# Patient Record
Sex: Male | Born: 1960 | Hispanic: Yes | Marital: Married | Smoking: Former smoker
Health system: Southern US, Community
[De-identification: ages and names within clinical notes are randomized; demographics above are authoritative.]

## PROBLEM LIST (undated history)

## (undated) ENCOUNTER — Inpatient Hospital Stay: Admission: EM | Payer: Self-pay | Source: Home / Self Care

## (undated) DIAGNOSIS — D509 Iron deficiency anemia, unspecified: Secondary | ICD-10-CM

## (undated) DIAGNOSIS — K635 Polyp of colon: Secondary | ICD-10-CM

## (undated) DIAGNOSIS — T7840XA Allergy, unspecified, initial encounter: Secondary | ICD-10-CM

## (undated) DIAGNOSIS — Z800 Family history of malignant neoplasm of digestive organs: Secondary | ICD-10-CM

## (undated) DIAGNOSIS — C169 Malignant neoplasm of stomach, unspecified: Principal | ICD-10-CM

## (undated) HISTORY — DX: Family history of malignant neoplasm of digestive organs: Z80.0

## (undated) HISTORY — PX: OTHER SURGICAL HISTORY: SHX169

## (undated) HISTORY — PX: COLONOSCOPY: SHX174

## (undated) HISTORY — DX: Polyp of colon: K63.5

## (undated) HISTORY — DX: Iron deficiency anemia, unspecified: D50.9

## (undated) HISTORY — PX: ESOPHAGOGASTRODUODENOSCOPY ENDOSCOPY: SHX5814

## (undated) HISTORY — DX: Allergy, unspecified, initial encounter: T78.40XA

## (undated) HISTORY — DX: Malignant neoplasm of stomach, unspecified: C16.9

---

## 2012-06-14 ENCOUNTER — Encounter: Payer: Self-pay | Admitting: Internal Medicine

## 2012-06-19 ENCOUNTER — Ambulatory Visit (AMBULATORY_SURGERY_CENTER): Payer: Self-pay | Admitting: *Deleted

## 2012-06-19 VITALS — Ht 62.0 in | Wt 143.5 lb

## 2012-06-19 DIAGNOSIS — Z1211 Encounter for screening for malignant neoplasm of colon: Secondary | ICD-10-CM

## 2012-06-19 MED ORDER — MOVIPREP 100 G PO SOLR: ORAL | Status: DC

## 2012-06-22 ENCOUNTER — Encounter: Payer: Self-pay | Admitting: Internal Medicine

## 2012-06-22 ENCOUNTER — Ambulatory Visit (AMBULATORY_SURGERY_CENTER): Payer: Self-pay | Admitting: Internal Medicine

## 2012-06-22 VITALS — BP 119/60 | HR 68 | Temp 97.6°F | Resp 14 | Ht 62.0 in | Wt 143.0 lb

## 2012-06-22 DIAGNOSIS — Z1211 Encounter for screening for malignant neoplasm of colon: Secondary | ICD-10-CM

## 2012-06-22 DIAGNOSIS — D126 Benign neoplasm of colon, unspecified: Secondary | ICD-10-CM

## 2012-06-22 MED ORDER — SODIUM CHLORIDE 0.9 % IV SOLN: 500.0000 mL | INTRAVENOUS | Status: DC

## 2012-06-22 NOTE — Op Note (Signed)
[facility] [address] [place], [address]   COLONOSCOPY PROCEDURE REPORT  PATIENT: Joseph Salinas, Joseph Salinas  MR#: [id number] BIRTHDATE: 1960-07-31 , 51  yrs. old GENDER: Male ENDOSCOPIST: Beverley Fiedler, MD PROCEDURE DATE:  06/22/2012 PROCEDURE:   Colonoscopy with snare polypectomy and Colonoscopy with cold biopsy polypectomy ASA CLASS:   Class I INDICATIONS:elevated risk screening, Patient's immediate family history of colon cancer, and first colonoscopy. MEDICATIONS: MAC sedation, administered by CRNA and propofol (Diprivan) 500mg  IV  DESCRIPTION OF PROCEDURE:   After the risks benefits and alternatives of the procedure were thoroughly explained, informed consent was obtained.  A digital rectal exam revealed no rectal mass.   The LB CF-[id number]  endoscope was introduced through the anus and advanced to the cecum, which was identified by both the appendix and ileocecal valve. No adverse events experienced. The quality of the prep was good, using MoviPrep  The instrument was then slowly withdrawn as the colon was fully examined.   COLON FINDINGS: A sessile polyp measuring 5 mm in size was found at the cecum.  A polypectomy was performed with a cold snare.  The resection was complete and the polyp tissue was completely retrieved.   Four sessile and semi-pedunculated polyps ranging between 3-22mm in size were found in the ascending colon. Polypectomy was performed with cold forceps (1) and using cold snare (3).  All resections were complete and all polyp tissue was completely retrieved.   Five semi-pedunculated and pedunculated polyps measuring 5-15 mm in size were found in the transverse colon.  Polypectomy was performed using cold snare and using hot snare.  All resections were complete and all polyp tissue was completely retrieved.   Three pedunculated polyps measuring 7-10 mm in size were found in the descending colon.  Polypectomy was performed using hot  snare.  All resections were complete and all polyp tissue was completely retrieved.   Six sessile and pedunculated polyps measuring 5-20 (one 20 mm pedunculated polyp removed en bloc from sigmoid with hot snare) mm in size were found in the sigmoid colon and rectosigmoid colon.  Polypectomy was performed using cold snare and using hot snare.  All resections were complete and all polyp tissue was completely retrieved. Multiple small sessile polyps were found in the sigmoid colon and rectosigmoid colon, not removed today due to small size and length the procedure.  Retroflexed views revealed no abnormalities. The time to cecum=2 minutes 47 seconds.  Withdrawal time=31 minutes 16 seconds.  The scope was withdrawn and the procedure completed. COMPLICATIONS: There were no complications.  ENDOSCOPIC IMPRESSION: 1.   Sessile polyp measuring 5 mm in size was found at the cecum; polypectomy was performed with a cold snare 2.   Four sessile polyps ranging between 3-10mm in size were found in the ascending colon; Polypectomy was performed with cold forceps and using cold snare 3.   Five pedunculated polyps measuring 5-15 mm in size were found in the transverse colon; Polypectomy was performed using cold snare and using hot snare 4.   Three pedunculated polyps measuring 7-10 mm in size were found in the descending colon; Polypectomy was performed using hot snare 5.   Six sessile polyps measuring 5-20 mm in size were found in the sigmoid colon and rectosigmoid colon; Polypectomy was performed using cold snare and using hot snare 6.   Multiple small sessile polyps were found in the sigmoid colon and rectosigmoid colon  RECOMMENDATIONS: 1.  Strictly avoid aspirin, aspirin products, and anti-inflammatory medication  for 3 weeks. 2.  Await pathology results 3.  Repeat colonoscopy within 6 months, 1 hour time slot 4.  If able, medical genetics referral   eSigned:  Beverley Fiedler, MD 06/22/2012 9:52  AM   cc: The Patient   PATIENT NAME:  Joseph Salinas, Joseph Salinas MR#: [id number]

## 2012-06-22 NOTE — Patient Instructions (Addendum)
YOU HAD AN ENDOSCOPIC PROCEDURE TODAY AT THE [facility]: Refer to the procedure report that was given to you for any specific questions about what was found during the examination.  If the procedure report does not answer your questions, please call your gastroenterologist to clarify.  If you requested that your care partner not be given the details of your procedure findings, then the procedure report has been included in a sealed envelope for you to review at your convenience later.  YOU SHOULD EXPECT: Some feelings of bloating in the abdomen. Passage of more gas than usual.  Walking can help get rid of the air that was put into your GI tract during the procedure and reduce the bloating. If you had a lower endoscopy (such as a colonoscopy or flexible sigmoidoscopy) you may notice spotting of blood in your stool or on the toilet paper. If you underwent a bowel prep for your procedure, then you may not have a normal bowel movement for a few days.  DIET: Your first meal following the procedure should be a light meal and then it is ok to progress to your normal diet.  A half-sandwich or bowl of soup is an example of a good first meal.  Heavy or fried foods are harder to digest and may make you feel nauseous or bloated.  Likewise meals heavy in dairy and vegetables can cause extra gas to form and this can also increase the bloating.  Drink plenty of fluids but you should avoid alcoholic beverages for 24 hours.  ACTIVITY: Your care partner should take you home directly after the procedure.  You should plan to take it easy, moving slowly for the rest of the day.  You can resume normal activity the day after the procedure however you should NOT DRIVE or use heavy machinery for 24 hours (because of the sedation medicines used during the test).    SYMPTOMS TO REPORT IMMEDIATELY: A gastroenterologist can be reached at any hour.  During normal business hours, 8:30 AM to 5:00 PM Monday through Friday,  call [phone].  After hours and on weekends, please call the GI answering service at [phone] who will take a message and have the physician on call contact you.   Following lower endoscopy (colonoscopy or flexible sigmoidoscopy):  Excessive amounts of blood in the stool  Significant tenderness or worsening of abdominal pains  Swelling of the abdomen that is new, acute  Fever of 100F or higher  FOLLOW UP: If any biopsies were taken you will be contacted by phone or by letter within the next 1-3 weeks.  Call your gastroenterologist if you have not heard about the biopsies in 3 weeks.  Our staff will call the home number listed on your records the next business day following your procedure to check on you and address any questions or concerns that you may have at that time regarding the information given to you following your procedure. This is a courtesy call and so if there is no answer at the home number and we have not heard from you through the emergency physician on call, we will assume that you have returned to your regular daily activities without incident.  SIGNATURES/CONFIDENTIALITY: You and/or your care partner have signed paperwork which will be entered into your electronic medical record.  These signatures attest to the fact that that the information above on your After Visit Summary has been reviewed and is understood.  Full responsibility of the confidentiality of this   discharge information lies with you and/or your care-partner.  Plipos en el colon  (Colon Polyps) Los plipos son masas de tejido que crecen dentro del cuerpo. Los plipos pueden desarrollarse en el intestino grueso (colon). La mayora de los plipos son no cancerosos (benignos). Sin embargo, algunos plipos pueden convertirse en cancerosos con el tiempo. Los plipos que sean ms grandes que un guisante pueden ser [employer]. Para estar seguros, los mdicos extirpan y [employer] plipos.  CAUSAS  Se  forman cuando ciertas mutaciones genticas hacen que las clulas se desarrollen y se dividan por dems.  FACTORES DE RIESGO  Hay un nmero de factores de riesgo que pueden aumentar las probabilidades de padecer plipos en el colon. Ellos son:   Ser mayor de 50 aos de edad.  Historia familiar de cncer o plipos de colon.  Ciertas enfermedades crnicas como la colitis o la enfermedad de Crohn.  Tener sobrepeso.  El hbito de fumar.  El sedentarismo.  Beber alcohol en exceso. SNTOMAS  La mayor parte de los plipos no causa sntomas. Si se presentan sntomas, stos pueden ser:   [employer] materia fecal. Heces de color rojo oscuro o negro.  Constipacin o diarrea que duran ms de 1 semana. DIAGNSTICO  [place] persona con frecuencia no sabe que tiene plipos [employer] su mdico los halla durante un examen fsico de [place]. El mdico puede usar 4 tipos de pruebas para Engineer, manufacturing plipos:   Examen Ecologist. Se colocar guantes y palpar el interior del recto. Esta prueba detectar solo plipos en el recto.  Enema de bario. El mdico introduce un lquido llamado bario en el recto y luego toma radiografas del colon. El bario hace que el colon se vea blanco. Los plipos son de color oscuro, por lo que son fciles de Landscape architect.  Sigmoideoscopia. Se coloca un tubo delgado y flexible (sigmoideoscopio) en el recto. Este sigmoideoscopio tiene [place] fuente de luz y Neomia Dear pequea cmara de video. El mdico [place] el sigmoideoscopio para observar el ltimo tercio del colon.  Colonoscopa. Esta prueba es similar a la sigmoideoscopia, pero el mdico examina todo el colon. Este es el mtodo ms frecuente para Engineer, manufacturing y extirpar los plipos. TRATAMIENTO  Todo plipo ser extirpado Franco Nones sigmoideoscopa o una colonoscopa. Luego se analizan para Clinical cytogeneticist.  PREVENCIN  Para disminuir los riesgos de volver a Warehouse manager plipos en el colon:   Coma mucha fruta y verdura fresca.  Evite las comidas grasas.  No fume.  Evite consumir alcohol.  Haga ejercicios [employer].  Baje de peso segn las indicaciones de su mdico.  Consuma mucho clcio y folato. Las comidas que contienen calcio son la [place]ann, los quesos y el brcoli. Las comidas que contienen folato son los garbanzos, los frijoles rojos y Conservation officer, historic buildings. INSTRUCCIONES PARA EL CUIDADO EN EL HOGAR  Cumpla con todas las visitas de control, segn le indique su mdico. Podr necesitar exmenes peridicos para controlar si vuelven a aparecer.  SOLICITE ATENCIN MDICA SI:  Nota sangrado al mover el intestino.  Document Released: 09/07/2011 [facility] Patient Information 2013 [place], [place].  Avoid aspirin, aspirin products, and anti-inflammatory medication for 3 weeks strictly (advil, motrin, ibuprofen, naproxen, etc)  Repeat colonoscopy within 6 months, in 1 hour time slot-we are not scheduling out this far yet please call office to schedule   If able get medical genetics referral

## 2012-06-22 NOTE — Progress Notes (Addendum)
Patient did not have preoperative order for IV antibiotic SSI prophylaxis. (G8918)Patient did not experience any of the following events: a burn prior to discharge; a fall within the facility; wrong site/side/patient/procedure/implant event; or a hospital transfer or hospital admission upon discharge from the facility. [phone])  Did not schedule colonoscopy for 6 months out, schedule not out that far, advised pt to call in and schedule, and that he would get a recall letter

## 2012-06-22 NOTE — Progress Notes (Signed)
Lidocaine-40mg IV prior to Propofol InductionPropofol given over incremental dosages 

## 2012-06-22 NOTE — Progress Notes (Signed)
Called to room to assist during endoscopic procedure.  Patient ID and intended procedure confirmed with present staff. Received instructions for my participation in the procedure from the performing physician.  

## 2012-06-23 ENCOUNTER — Telehealth: Payer: Self-pay | Admitting: *Deleted

## 2012-06-23 NOTE — Telephone Encounter (Signed)
  Follow up Call-  No answer, left message to call if questions or concerns.    

## 2012-06-26 ENCOUNTER — Telehealth: Payer: Self-pay | Admitting: Internal Medicine

## 2012-06-26 NOTE — Telephone Encounter (Signed)
Wife ? Reports pt is having abdominal pain and hasn't had a BM since the prep on 06/22/12. He also c/o "itching inside" below his waist. Instructed her to have pt use Miralax up to 3 times today. If he can't have a BM or if the pain does not ease after a BM, call tomorrow; she stated understanding.

## 2012-06-28 ENCOUNTER — Encounter: Payer: Self-pay | Admitting: Internal Medicine

## 2012-07-01 ENCOUNTER — Emergency Department (HOSPITAL_COMMUNITY): Payer: Self-pay

## 2012-07-01 ENCOUNTER — Encounter (HOSPITAL_COMMUNITY): Payer: Self-pay | Admitting: Emergency Medicine

## 2012-07-01 ENCOUNTER — Emergency Department (HOSPITAL_COMMUNITY)
Admission: EM | Admit: 2012-07-01 | Discharge: 2012-07-01 | Disposition: A | Discharge status: Home / Self Care | Payer: Self-pay | Attending: Emergency Medicine | Admitting: Emergency Medicine

## 2012-07-01 DIAGNOSIS — Z860100 Personal history of colon polyps, unspecified: Secondary | ICD-10-CM | POA: Insufficient documentation

## 2012-07-01 DIAGNOSIS — Z9889 Other specified postprocedural states: Secondary | ICD-10-CM | POA: Insufficient documentation

## 2012-07-01 DIAGNOSIS — Z800 Family history of malignant neoplasm of digestive organs: Secondary | ICD-10-CM | POA: Insufficient documentation

## 2012-07-01 DIAGNOSIS — F17200 Nicotine dependence, unspecified, uncomplicated: Secondary | ICD-10-CM | POA: Insufficient documentation

## 2012-07-01 DIAGNOSIS — R109 Unspecified abdominal pain: Secondary | ICD-10-CM | POA: Insufficient documentation

## 2012-07-01 LAB — CBC WITH DIFFERENTIAL/PLATELET
Basophils Absolute: 0.1 10*3/uL (ref 0.0–0.1)
Lymphocytes Relative: 23 % (ref 12–46)
Lymphs Abs: 1.6 10*3/uL (ref 0.7–4.0)
Neutro Abs: 4.6 10*3/uL (ref 1.7–7.7)
Neutrophils Relative %: 65 % (ref 43–77)
Platelets: 292 10*3/uL (ref 150–400)
RBC: 4.51 MIL/uL (ref 4.22–5.81)
WBC: 7 10*3/uL (ref 4.0–10.5)

## 2012-07-01 LAB — BASIC METABOLIC PANEL
CO2: 29 mEq/L (ref 19–32)
Glucose, Bld: 94 mg/dL (ref 70–99)
Potassium: 4.1 mEq/L (ref 3.5–5.1)
Sodium: 137 mEq/L (ref 135–145)

## 2012-07-01 LAB — POCT I-STAT, CHEM 8
Chloride: 102 mEq/L (ref 96–112)
Creatinine, Ser: 0.8 mg/dL (ref 0.50–1.35)
Glucose, Bld: 95 mg/dL (ref 70–99)
HCT: 42 % (ref 39.0–52.0)
Potassium: 4 mEq/L (ref 3.5–5.1)
Sodium: 140 mEq/L (ref 135–145)

## 2012-07-01 LAB — OCCULT BLOOD, POC DEVICE: Fecal Occult Bld: POSITIVE — AB

## 2012-07-01 NOTE — ED Provider Notes (Signed)
History     CSN: [id number]  Arrival date & time 07/01/12  1050   First MD Initiated Contact with Patient 07/01/12 1115      Chief Complaint  Patient presents with  . Abdominal Pain    (Consider location/radiation/quality/duration/timing/severity/associated sxs/prior treatment) HPI  52 year old male who is a Spanish-speaking patient presents complaining of abdominal pain. Pacific phone Interpreter was used for translation.  Patient has a family history of colon cancer. He recently underwent a colonoscopy last week (06/22/12) for symptoms of constipation and weight loss. Colonoscopy report reveals several sessile polyps which were removed. Patient was told that he has intimal polyps that will need to be removed at a later date. Polyps has been sent for biopsy. Since the colonoscopy patient has been feeling weak, and lacking energy to work. He was continued to be constipated however symptom has improved he had a normal bowel movement yesterday and today. This morning, while in bed he experiencing a crampy pain to his lower abdomen radiates towards his right side of ribs. Pain was lasting for over an hour but resolved without any specific treatment. Describe pain as "draft of air".  Patient also endorsed chills. His pain is currently 1/10 while resting. Patient denies fever, headache, chest pain, shortness of breath, back pain, dysuria, hematuria, hematochezia, or melena. He denies any rectal pain.   History reviewed. No pertinent past medical history.  Past Surgical History  Procedure Laterality Date  . No prior surgery      Family History  Problem Relation Age of Onset  . Colon cancer Mother 87    History  Substance Use Topics  . Smoking status: Current Every Day Smoker -- 0.33 packs/day    Types: Cigarettes  . Smokeless tobacco: Never Used  . Alcohol Use: Yes     Comment: not in 1 month;before that 6 beers on a weekend      Review of Systems  Constitutional:       A complete  10 system review of systems was obtained and all systems are negative except as noted in the HPI and PMH.    Allergies  Penicillins  Home Medications  No current outpatient prescriptions on file.  BP 114/76  Pulse 65  Temp(Src) 98.6 F (37 C) (Oral)  Resp 16  SpO2 98%  Physical Exam  Nursing note and vitals reviewed. Constitutional: He is oriented to person, place, and time. He appears well-developed and well-nourished. No distress.  Awake, alert, nontoxic appearance  HENT:  Head: Atraumatic.  Eyes: Conjunctivae are normal. Right eye exhibits no discharge. Left eye exhibits no discharge.  Neck: Normal range of motion. Neck supple.  Cardiovascular: Normal rate and regular rhythm.   Pulmonary/Chest: Effort normal. No respiratory distress. He exhibits no tenderness.  Abdominal: Soft. Bowel sounds are normal. He exhibits no distension and no mass. There is tenderness (Mild left lower quadrant tenderness without guarding or rebound tenderness.). There is no rebound and no guarding.  No abdominal hernia noted.  Genitourinary: Guaiac positive stool.  No CVA tenderness.  No hernia noted.  On rectal exam, normal rectal tone, no mass, no melena, no impaction.  Hemoccult positive  Musculoskeletal: He exhibits no tenderness.  ROM appears intact, no obvious focal weakness  Neurological: He is alert and oriented to person, place, and time.  Skin: Skin is warm and dry. No rash noted.  Psychiatric: He has a normal mood and affect.    ED Course  Procedures (including critical care time)  11:45 AM Patient  presents with left lower quadrant abdominal pain. He had a colonoscopy performed a week ago. His abdomen is currently nonsurgical. He is afebrile with stable normal vital signs. He appears nontoxic. Although bowel perforation is on the differential, my suspicion is low.  Work up initiated. Acute abdominal series ordered.  Pain medication offered, pt decline as pain is minimal at this  time.    1:46 PM Patient with normal electrolytes, normal lactic acid, normal white count, and fecal occult blood positive however with normal appearance stool. He is currently pain-free. Abdominal series shows large stool burden however no other acute abnormality. At this time I recommend patient to continue with MiraLAX, and followup with his GI specialist for further management. Strict return precautions discussed. Patient voiced understanding and agrees with plan.  Labs Reviewed  OCCULT BLOOD, POC DEVICE - Abnormal; Notable for the following:    Fecal Occult Bld POSITIVE (*)    All other components within normal limits  CBC WITH DIFFERENTIAL  BASIC METABOLIC PANEL  LACTIC ACID, PLASMA  OCCULT BLOOD X 1 CARD TO LAB, STOOL  POCT I-STAT, CHEM 8   No results found.   1. Abdominal pain       MDM  BP 114/76  Pulse 65  Temp(Src) 98.6 F (37 C) (Oral)  Resp 16  SpO2 98%  I have reviewed nursing notes and vital signs. I personally reviewed the imaging tests through PACS system  I reviewed available ER/hospitalization records thought the EMR         Fayrene Helper, [place] 07/01/12 1353

## 2012-07-01 NOTE — ED Notes (Signed)
Translator stated, he had a colonoscopy last week for constipation and loosing weight. Last night he had bad stomach pain

## 2012-07-01 NOTE — ED Provider Notes (Signed)
Medical screening examination/treatment/procedure(s) were conducted as a shared visit with non-physician practitioner(s) and myself.  I personally evaluated the patient during the encounter.  Patient is nontoxic. No acute abdomen.  Donnetta Hutching, MD 07/01/12 [phone]

## 2012-07-03 ENCOUNTER — Telehealth: Payer: Self-pay | Admitting: *Deleted

## 2012-07-03 DIAGNOSIS — Z800 Family history of malignant neoplasm of digestive organs: Secondary | ICD-10-CM

## 2012-07-03 DIAGNOSIS — Z86010 Personal history of colonic polyps: Secondary | ICD-10-CM

## 2012-07-03 NOTE — Telephone Encounter (Signed)
lmom for pt to call back

## 2012-07-03 NOTE — Telephone Encounter (Signed)
Message copied by Florene Glen on Mon Jul 03, 2012 10:45 AM ------      Message from: Beverley Fiedler      Created: Mon Jul 03, 2012  8:25 AM       Micah Flesher to the ED with abd pain.      Had colon recently with multiple polyps removed.      Looks like constipation by report.      Please check on him and can be seen in office if necessary by PA       ------

## 2012-07-03 NOTE — Telephone Encounter (Signed)
Spoke with pt's wife who states she understands [place] well. She states Joseph Salinas is in a lot of pain and tested + for occult blood in his stool on ER visit 07/01/12. XRAY showed a heavy stool burden and I asked if he was still taking Miralax up to 3 times daily for his bowels; telephone call on 06/26/12. She reports he has stools, but not a complete emptying. Offered pt an appt in am with Mike Gip, PA and pt will be seen at 08:30am. Per 06/22/12 COLON recommendations, Dr Rhea Belton wanted a genetics referral. I asked wife if she understood what I meant and she stated yes and she would like one made. Informed her not to be alarmed because the call will come from the [facility]. She started to hang up and started crying. I informed her several times pt does not have cancer and we discussed the path. I will have Amy discuss this with pt in am; interpreter should be here in am. Wife stated understanding.

## 2012-07-04 ENCOUNTER — Encounter: Payer: Self-pay | Admitting: Physician Assistant

## 2012-07-04 ENCOUNTER — Ambulatory Visit (INDEPENDENT_AMBULATORY_CARE_PROVIDER_SITE_OTHER): Payer: Self-pay | Admitting: Physician Assistant

## 2012-07-04 VITALS — BP 104/68 | HR 72 | Ht 63.39 in | Wt 139.4 lb

## 2012-07-04 DIAGNOSIS — Z86010 Personal history of colonic polyps: Secondary | ICD-10-CM

## 2012-07-04 DIAGNOSIS — Z860101 Personal history of adenomatous and serrated colon polyps: Secondary | ICD-10-CM | POA: Insufficient documentation

## 2012-07-04 MED ORDER — CIPROFLOXACIN HCL 500 MG PO TABS: 500.0000 mg | ORAL_TABLET | Freq: Two times a day (BID) | ORAL | Status: AC

## 2012-07-04 NOTE — Progress Notes (Addendum)
Subjective:    Patient ID: Joseph Salinas, male    DOB: 1960-12-15, 52 y.o.   MRN: [id number]  HPI   Joseph Salinas is a pleasant 52 year old Hispanic male known recently to Dr. Rhea Belton who underwent colonoscopy on 06/22/2012 for screening. He has family history of colon cancer in his mother diagnosed at age 72. Patient was found to have numerous colon polyps and 19 polyps in total were removed, scattered throughout the colon and varying sizes. He also had multiple small sessile polyps in the sigmoid colon and rect O. sigmoid colon that were not removed.. He had 3 pedunculated polyps measuring 7-10 mm in the descending colon and 6 sessile polyps measuring 5-20 mm in the sigmoid colon and rectosigmoid colon removed using cold snare and hot snare. Path on these polyps has returned showing tubular and tubulovillous adenomas without high-grade dysplasia. Patient apparently developed some lower abdominal pain about 3 days after his procedure and also had not had a bowel movement. He called and was advised to take MiraLax. He then went to the emergency room on 07/01/2012 because he was having an increase in lower abdominal pain. This was left-sided and constant he did not have any associated fever, chills, diarrhea, melena, hematochezia or nausea and vomiting. Per the report is CBC was normal, he had plain abdominal films done which were negative and was discharged home. He comes in today with continued abdominal pain though states overall this is significantly better than over the weekend. When asked her rate his pain he states that it it is a 2/10 currently. He describes it as an aching burning sensation. He has been using Tylenol no NSAIDs. His bowels are moving normally at this point again without any evidence of hematochezia. His appetite has been fine and he has not noted any changes in his abdominal pain with by mouth intake. He has not had any fever. All discussion was had via the interpreter.  We also discussed  findings on his colonoscopy and discussed referral to genetic counselor as he may have a familial adenomatous polyposis syndrome   Review of Systems  Constitutional: Negative.   Eyes: Negative.   Respiratory: Negative.   Cardiovascular: Negative.   Gastrointestinal: Positive for abdominal pain.  Endocrine: Negative.   Genitourinary: Negative.   Allergic/Immunologic: Negative.   Neurological: Negative.   Hematological: Negative.   Psychiatric/Behavioral: Negative.    Outpatient Prescriptions Prior to Visit  Medication Sig Dispense Refill  . acetaminophen (TYLENOL) 500 MG tablet Take 1,000 mg by mouth every 6 (six) hours as needed for pain.      . polyethylene glycol (MIRALAX / GLYCOLAX) packet Take 17 g by mouth daily.       No facility-administered medications prior to visit.   Allergies  Allergen Reactions  . Penicillins Rash    Patient Active Problem List  Diagnosis  . Hx of adenomatous colonic polyps   History  Substance Use Topics  . Smoking status: Former Smoker -- 0.33 packs/day    Types: Cigarettes    Quit date: 06/20/2012  . Smokeless tobacco: Never Used  . Alcohol Use: Yes     Comment: not in 1 month;before that 6 beers on a weekend   family history includes Colon cancer (age of onset: 23) in his mother and Leukemia in his daughter.    Objective:   Physical Exam  well-developed Hispanic male in no acute distress, accompanied by his wife. An interpreter was in the room during evaluation as well. Blood pressure 104/68 pulse 72  height 5 foot 3 weight 139. HEENT nontraumatic normocephalic EOMI PERRLA sclera anicteric,Neck; Supple- no JVD, Cardiovascular; regular rate and rhythm with S1-S2 no murmur or gallop, Pulmonary; clear bilaterally, Abdomen; soft bowel sounds are active she is non-distended. He is tender in the left lower quadrant without guarding or rebound there is no palpable mass or hepatosplenomegaly, Rectal; exam not done, Extremities no clubbing cyanosis  or edema skin warm dry, Psych; mood and affect normal and appropriate .        Assessment & Plan:  #71 52 year old Hispanic male with left lower quadrant pain onset 3 days post-colonoscopy, now gradually improving. I suspect he has  a thermal injury due to removal of multiple left colon polyps.  #2 numerous tubular and tubovillous adenomatous polyps with 19 polyps removed at recent colonoscopy. Rule out familial adenomatous polyposis. #3 family history of colon cancer in patient's mother  Plan; long discussion with the patient and his wife regarding his abdominal pain, multiple colon polyps and referral to genetic counselor. He will use Tylenol as needed for abdominal  discomfort We'll start Cipro 500 mg by mouth twice daily x7 days Patient and wife are  Aware  that they should call here for any problems with increasing abdominal pain bleeding or fever. They are also aware that is very important that he have followup colonoscopy at a 6 month interval  with Dr. Rhea Belton.  Addendum: Reviewed and agree with management. Beverley Fiedler, MD

## 2012-07-04 NOTE — Patient Instructions (Addendum)
Take Tylenol as needed for pain.  We sent a prescription for Cipro to [facility], [address]  Call us here for any problems with pain and fever. Make a follow up appointment with Dr. Erick Blinks in early October.

## 2012-07-17 ENCOUNTER — Encounter: Payer: Self-pay | Admitting: Internal Medicine

## 2012-07-17 ENCOUNTER — Ambulatory Visit (INDEPENDENT_AMBULATORY_CARE_PROVIDER_SITE_OTHER): Payer: Self-pay | Admitting: Internal Medicine

## 2012-07-17 ENCOUNTER — Telehealth: Payer: Self-pay | Admitting: Internal Medicine

## 2012-07-17 ENCOUNTER — Other Ambulatory Visit (INDEPENDENT_AMBULATORY_CARE_PROVIDER_SITE_OTHER): Payer: Self-pay

## 2012-07-17 VITALS — BP 120/80 | HR 60 | Ht 63.0 in | Wt 134.8 lb

## 2012-07-17 DIAGNOSIS — Z9889 Other specified postprocedural states: Secondary | ICD-10-CM

## 2012-07-17 DIAGNOSIS — R109 Unspecified abdominal pain: Secondary | ICD-10-CM

## 2012-07-17 LAB — CBC
HCT: 35.6 % — ABNORMAL LOW (ref 39.0–52.0)
Hemoglobin: 11.8 g/dL — ABNORMAL LOW (ref 13.0–17.0)
RBC: 3.99 Mil/uL — ABNORMAL LOW (ref 4.22–5.81)
RDW: 13.3 % (ref 11.5–14.6)
WBC: 7.9 10*3/uL (ref 4.5–10.5)

## 2012-07-17 LAB — LIPASE: Lipase: 18 U/L (ref 11.0–59.0)

## 2012-07-17 LAB — COMPREHENSIVE METABOLIC PANEL
ALT: 15 U/L (ref 0–53)
CO2: 30 mEq/L (ref 19–32)
Calcium: 8.8 mg/dL (ref 8.4–10.5)
Chloride: 104 mEq/L (ref 96–112)
GFR: 100.78 mL/min (ref >60.00)
Glucose, Bld: 94 mg/dL (ref 70–99)
Sodium: 139 mEq/L (ref 135–145)
Total Bilirubin: 0.4 mg/dL (ref 0.3–1.2)
Total Protein: 6.8 g/dL (ref 6.0–8.3)

## 2012-07-17 MED ORDER — HYDROCODONE-ACETAMINOPHEN 5-325 MG PO TABS: 1.0000 | ORAL_TABLET | Freq: Four times a day (QID) | ORAL | Status: DC | PRN

## 2012-07-17 MED ORDER — OMEPRAZOLE 40 MG PO CPDR: 40.0000 mg | DELAYED_RELEASE_CAPSULE | Freq: Every day | ORAL | Status: DC

## 2012-07-17 NOTE — Progress Notes (Signed)
Subjective:    Patient ID: Joseph Salinas, male    DOB: [birth year], 52 y.o.   MRN: [id number]  HPI Joseph Salinas is a 52 year old male recently known to me from colonoscopy performed 06/22/2012 for screening. His colonoscopy revealed numerous adenomatous colon polyps scattered throughout the colon. None of his polyps had high-grade dysplasia or adenocarcinoma.  After the procedure he did have lower abdominal pain. 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 He was treated one week of ciprofloxacin which he was due to complete today. He returns today still having periumbilical pain which is only present after eating. However it is severe and also associated with nausea. It tends to be worse at night. He has not had any further trouble with constipation and his bowels are moving approximately one to 2 times per day. They have not been bloody. He does feel overly gassy. He said no rectal bleeding or melena. He's had no vomiting. No fevers that he is aware of. He has avoided eating due to fear of the pain and has lost approximately 15 pounds. He recently took Alka-Seltzer which he found to be somewhat helpful. He is not using MiraLax at this point.  Review of Systems As per HPI, otherwise negative  Current Medications, Allergies, Past Medical History, Past Surgical History, Family History and Social History were reviewed in [employer] record.     Objective:   Physical Exam BP 120/80  Pulse 60  Ht 5\' 3"  (1.6 m)  Wt 134 lb 12.8 oz (61.145 kg)  BMI 23.88 kg/m2 Constitutional: Well-developed and well-nourished. No distress. HEENT: Normocephalic and atraumatic. Oropharynx is clear and moist. No oropharyngeal exudate. Conjunctivae are normal.  No scleral icterus. Cardiovascular: Normal rate, regular rhythm and intact distal pulses. No M/R/G Pulmonary/chest: Effort normal and breath sounds normal. No wheezing, rales or  rhonchi. Abdominal: Soft, mild midabdominal tenderness without significant rebound or guarding, nondistended. Bowel sounds active throughout. There are no masses palpable.  Extremities: no clubbing, cyanosis, or edema Lymphadenopathy: No cervical adenopathy noted. Neurological: Alert and oriented to person place and time. Skin: Skin is warm and dry. No rashes noted. Psychiatric: Normal mood and affect. Behavior is normal.  CBC    Component Value Date/Time   WBC 7.9 07/17/2012 1528   RBC 3.99* 07/17/2012 1528   HGB 11.8* 07/17/2012 1528   HCT 35.6* 07/17/2012 1528   PLT 314.0 07/17/2012 1528   MCV 89.2 07/17/2012 1528   MCH 29.9 07/01/2012 1111   MCHC 33.1 07/17/2012 1528   RDW 13.3 07/17/2012 1528   LYMPHSABS 1.6 07/01/2012 1111   MONOABS 0.6 07/01/2012 1111   EOSABS 0.1 07/01/2012 1111   BASOSABS 0.1 07/01/2012 1111    CMP     Component Value Date/Time   NA 139 07/17/2012 1528   K 4.2 07/17/2012 1528   CL 104 07/17/2012 1528   CO2 30 07/17/2012 1528   GLUCOSE 94 07/17/2012 1528   BUN 11 07/17/2012 1528   CREATININE 0.9 07/17/2012 1528   CALCIUM 8.8 07/17/2012 1528   PROT 6.8 07/17/2012 1528   ALBUMIN 3.7 07/17/2012 1528   AST 18 07/17/2012 1528   ALT 15 07/17/2012 1528   ALKPHOS 54 07/17/2012 1528   BILITOT 0.4 07/17/2012 1528   GFRNONAA >90 07/01/2012 1111   GFRAA >90 07/01/2012 1111    Lipase     Component Value Date/Time   LIPASE 18.0 07/17/2012 1528  Assessment & Plan:   52 year old male recently known to me from colonoscopy performed 06/22/2012 for screening found to have multiple adenomatous colon polyps status post multiple polypectomies presenting for abdominal pain  1.  Postprandial abd pain, recent colonoscopy with multiple polypectomy -- certainly he would've been at risk for post polypectomy syndrome given that several of his polyps removed with hot snare. At this point I would've expected that to improve, but he continues to struggle with postprandial pain. At this point  I feel that he should have a CT scan of the abdomen and pelvis with contrast to better evaluate his symptoms. I'm not convinced that this is related to his colonoscopy at this point. Labs today were fairly unremarkable. He does have a mild anemia, but his white count is normal as is his lipase. I will give him a prescription for omeprazole 40 mg daily, given that this could be coming from the upper GI tract such as ulcer disease. I will give him a prescription for Vicodin to be used as needed and as directed for severe pain. Further recommendations after cross-sectional imaging. He will continue a bland diet for now.

## 2012-07-17 NOTE — Telephone Encounter (Signed)
Wife reports pt continues to have lower abdominal pain. He had COLON on 06/22/12 and saw Mike Gip, PA on 07/04/12. At that time, he was thought to have thermal pain from the procedure; he was to use Tylenol for pain and was started on Cipro. Pt was given f/u with Dr Rhea Belton today.

## 2012-07-17 NOTE — Patient Instructions (Addendum)
You have been scheduled for a CT scan of the abdomen and pelvis at [facility] ([address]---this is in the same building as [facility]).   You are scheduled on 07/18/2012 at 10:00am. You should arrive 15 minutes prior to your appointment time for registration. Please follow the written instructions below on the day of your exam:  WARNING: IF YOU ARE ALLERGIC TO IODINE/X-RAY DYE, PLEASE NOTIFY RADIOLOGY IMMEDIATELY AT [phone]! YOU WILL BE GIVEN A 13 HOUR PREMEDICATION PREP.  1) Do not eat or drink anything after 6:00am (4 hours prior to your test) 2) You have been given 2 bottles of oral contrast to drink. The solution may taste better if refrigerated, but do NOT add ice or any other liquid to this solution. Shake well before drinking.    Drink 1 bottle of contrast @ 8:00 (2 hours prior to your exam)  Drink 1 bottle of contrast @ 9:00 (1 hour prior to your exam)  You may take any medications as prescribed with a small amount of water except for the following: Metformin, Glucophage, Glucovance, Avandamet, Riomet, Fortamet, Actoplus Met, Janumet, Glumetza or Metaglip. The above medications must be held the day of the exam AND 48 hours after the exam.  The purpose of you drinking the oral contrast is to aid in the visualization of your intestinal tract. The contrast solution may cause some diarrhea. Before your exam is started, you will be given a small amount of fluid to drink. Depending on your individual set of symptoms, you may also receive an intravenous injection of x-ray contrast/dye. Plan on being at [facility] for 30 minutes or long, depending on the type of exam you are having performed.  If you have any questions regarding your exam or if you need to reschedule, you may call the CT department at [phone] between the hours of 8:00 am and 5:00 pm, Monday-Friday.  Your physician has requested that you go to the basement for the following lab work  before leaving today: CBC, CMP, LIPASE  We have sent the following medications to your pharmacy for you to pick up at your convenience: Vicoden, Omeprazole  Follow up in 2 weeks   ________________________________________________________________________

## 2012-07-18 ENCOUNTER — Encounter (HOSPITAL_COMMUNITY): Payer: Self-pay

## 2012-07-18 ENCOUNTER — Ambulatory Visit (HOSPITAL_COMMUNITY)
Admission: RE | Admit: 2012-07-18 | Discharge: 2012-07-18 | Disposition: A | Discharge status: Home / Self Care | Payer: Self-pay | Source: Ambulatory Visit | Attending: Internal Medicine | Admitting: Internal Medicine

## 2012-07-18 ENCOUNTER — Telehealth: Payer: Self-pay | Admitting: *Deleted

## 2012-07-18 DIAGNOSIS — R109 Unspecified abdominal pain: Secondary | ICD-10-CM

## 2012-07-18 DIAGNOSIS — K7689 Other specified diseases of liver: Secondary | ICD-10-CM | POA: Insufficient documentation

## 2012-07-18 DIAGNOSIS — K319 Disease of stomach and duodenum, unspecified: Secondary | ICD-10-CM | POA: Insufficient documentation

## 2012-07-18 DIAGNOSIS — R634 Abnormal weight loss: Secondary | ICD-10-CM | POA: Insufficient documentation

## 2012-07-18 DIAGNOSIS — K5900 Constipation, unspecified: Secondary | ICD-10-CM | POA: Insufficient documentation

## 2012-07-18 MED ORDER — IOHEXOL 300 MG/ML  SOLN
100.0000 mL | Freq: Once | INTRAMUSCULAR | Status: AC | PRN
  Administered 2012-07-18: 100 mL via INTRAVENOUS

## 2012-07-18 NOTE — Telephone Encounter (Signed)
Informed wife of appt tomorrow at 10am to discuss ct

## 2012-07-19 ENCOUNTER — Ambulatory Visit (INDEPENDENT_AMBULATORY_CARE_PROVIDER_SITE_OTHER): Payer: Self-pay | Admitting: Internal Medicine

## 2012-07-19 ENCOUNTER — Encounter: Payer: Self-pay | Admitting: Internal Medicine

## 2012-07-19 VITALS — BP 116/74 | HR 72 | Ht 63.0 in | Wt 133.0 lb

## 2012-07-19 DIAGNOSIS — R933 Abnormal findings on diagnostic imaging of other parts of digestive tract: Secondary | ICD-10-CM

## 2012-07-19 DIAGNOSIS — R109 Unspecified abdominal pain: Secondary | ICD-10-CM

## 2012-07-19 NOTE — Progress Notes (Signed)
Subjective:    Patient ID: Joseph Salinas, male    DOB: [birth year], 52 y.o.   MRN: [id number]  HPI Mr. Frisbee is a 52 year old male recently known to me from colonoscopy performed 06/22/2012 for screening, whom I saw earlier this week for ongoing mid abdominal pain after colonoscopy.  Initially it was felt that he may have had post polypectomy syndrome or even constipation following the procedure.  He was treated with one week of antibiotics orally without improvement. CT scan was performed yesterday which is concerning for a gastric mass, with metastasis to regional lymph nodes and possibly omentum.  On further questioning today, he was having some abdominal symptomatology including postprandial pain and fullness which occurred before his colonoscopy. He was seen in urgent care for these symptoms and sent direct for colonoscopy, which was performed as described previously.  He was started on omeprazole 40 mg daily earlier this week and he has been using Vicodin for pain which he has found helpful. No other new symptoms at this point.  He is here today with his wife and we used a member of our staff Brayton Caves) to help with Spanish translation  Review of Systems As per history of present illness, otherwise negative     Objective:   Physical Exam BP 116/74  Pulse 72  Ht 5\' 3"  (1.6 m)  Wt 133 lb (60.328 kg)  BMI 23.57 kg/m2 Constitutional: Well-developed and well-nourished. No distress. HEENT: Normocephalic and atraumatic.  No scleral icterus. Neurological: Alert and oriented to person place and time. Skin: Skin is warm and dry. No rashes noted. Psychiatric: Normal mood and affect. Behavior is normal.  Clinical Data: Mid the upper abdominal pain.  Especially after eating.  Weight loss.  Constipation.  Colonoscopy with polyp removal 3 weeks ago.   CT ABDOMEN AND PELVIS WITH CONTRAST -- 07/18/12   Technique:  Multidetector CT imaging of the abdomen and pelvis was performed following the  standard protocol during bolus administration of intravenous contrast.   Contrast: OMNIPAQUE IOHEXOL 300 MG/ML  SOLN   Comparison: Plain film of 07/01/2012.  Clinic note of 07/17/2012.   Findings: Lung bases:  Clear lung bases.  Normal heart size, without pericardial or pleural effusion.   Abdomen/pelvis:  Numerous low density liver lesions.  Some of these are too small entirely characterize.  The larger lesions are most consistent with simple cysts.   Normal spleen.   An area of moderate to marked wall thickening involving the greater curvature of the stomach, most consistent with carcinoma.  Example image 15/series 2 and image 30/series 602 coronal.  No obstruction.   Normal pancreas, gallbladder, biliary tract, right adrenal gland. Minimal left adrenal nodularity, nonspecific.   Normal kidneys.  No retroperitoneal or retrocrural adenopathy.   Normal caliber of the colon, without obstruction.  Normal terminal ileum.  Appendix not well visualized.  Minimal motion degradation at the level of the mid abdomen.  Small bowel normal in caliber.   Necrotic mass positioned within the gastrocolic ligament on image 20/series 2, measuring 3.6 x 3.2 cm. Innumerable other omental and peritoneal nodules, consistent with metastatic disease. -  Index right-sided omental lesion measures 1.9 cm on image 26/series 2. -  A probable serosal lesion along the inferior aspect of the distal ileum measures 2.3 cm on image 55/series 2. -  A nodule just deep to the umbilicus, along the peritoneal surface, measures 1.7 cm on image 49/series 2.   Small peritoneal nodules are identified within the pelvis, including on  image 68/series 2.   No pelvic sidewall adenopathy.  Normal urinary bladder and prostate.  Small volume cul-de-sac fluid.   Bones/Musculoskeletal:  No acute osseous abnormality.   IMPRESSION:   1.  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 Gastric body mass  with extensive omental/peritoneal metastasis.  No bowel obstruction or other acute complication. These results will be called to the ordering clinician or representative by the Radiologist Assistant, and communication documented in the PACS Dashboard. 2.  Small volume cul-de-sac fluid, likely secondary. 3.  Hepatic lesions which are most consistent with cysts or bile duct hamartomas.  Some lesions are technically too small to characterize and warrant followup attention.     Assessment & Plan:   52 year old male recently known to me from colonoscopy performed 06/22/2012 for screening, whom I saw earlier this week for ongoing mid abdominal pain after colonoscopy.  1.  Abnormal GI imaging/concern for gastric cancer -- be reviewed his CT findings today which are very unfortunate. The exam is highly concerning for gastric cancer with metastasis.  I recommended an urgent endoscopy, and will be able to perform this tomorrow at 2 PM. We discussed the test today including risks and benefits and he agrees to proceed. If concern for malignancy is found, I will send the pathology in rushed fashion, and make the appropriate referrals thereafter. For now he'll continue with daily PPI, avoid NSAIDs, and use Vicodin for pain.  Further recommendations after endoscopy tomorrow

## 2012-07-19 NOTE — Patient Instructions (Addendum)
You have been scheduled for an endoscopy with propofol. Please follow written instructions given to you at your visit today. If you use inhalers (even only as needed), please bring them with you on the day of your procedure. Your physician has requested that you go to www.[email or web address].com and enter the access code given to you at your visit today. This web site gives a general overview about your procedure. However, you should still follow specific instructions given to you by our office regarding your preparation for the procedure.                                                We are excited to introduce MyChart, a new best-in-class service that provides you online access to important information in your electronic medical record. We want to make it easier for you to view your health information - all in one secure location - when and where you need it. We expect MyChart will enhance the quality of care and service we provide.  When you register for MyChart, you can:    View your test results.    Request appointments and receive appointment reminders via email.    Request medication renewals.    View your medical history, allergies, medications and immunizations.    Communicate with your physician's office through a password-protected site.    Conveniently print information such as your medication lists.  To find out if MyChart is right for you, please talk to a member of our clinical staff today. We will gladly answer your questions about this free health and wellness tool.  If you are age 52 or older and want a member of your family to have access to your record, you must provide written consent by completing a proxy form available at our office. Please speak to our clinical staff about guidelines regarding accounts for patients younger than age 18.  As you activate your MyChart account and need any technical assistance, please call the MyChart technical support line at [phone]  ([phone]) or email your question to [email or web address]. If you email your question(s), please include your name, a return phone number and the best time to reach you.  If you have non-urgent health-related questions, you can send a message to our office through MyChart at [email or web address]. If you have a medical emergency, call 911.  Thank you for using MyChart as your new health and wellness resource!   MyChart licensed from [employer],  1999-2010. Patents Pending.   

## 2012-07-20 ENCOUNTER — Ambulatory Visit (AMBULATORY_SURGERY_CENTER): Payer: BC Managed Care – PPO | Admitting: Internal Medicine

## 2012-07-20 ENCOUNTER — Other Ambulatory Visit: Payer: Self-pay | Admitting: Gastroenterology

## 2012-07-20 ENCOUNTER — Encounter: Payer: Self-pay | Admitting: Internal Medicine

## 2012-07-20 ENCOUNTER — Telehealth: Payer: Self-pay | Admitting: *Deleted

## 2012-07-20 ENCOUNTER — Other Ambulatory Visit: Payer: Self-pay | Admitting: Internal Medicine

## 2012-07-20 VITALS — BP 121/74 | HR 73 | Temp 98.6°F | Resp 19 | Ht 63.0 in | Wt 133.0 lb

## 2012-07-20 DIAGNOSIS — R109 Unspecified abdominal pain: Secondary | ICD-10-CM

## 2012-07-20 DIAGNOSIS — R933 Abnormal findings on diagnostic imaging of other parts of digestive tract: Secondary | ICD-10-CM

## 2012-07-20 DIAGNOSIS — K319 Disease of stomach and duodenum, unspecified: Secondary | ICD-10-CM

## 2012-07-20 DIAGNOSIS — K3189 Other diseases of stomach and duodenum: Secondary | ICD-10-CM

## 2012-07-20 DIAGNOSIS — D490 Neoplasm of unspecified behavior of digestive system: Secondary | ICD-10-CM

## 2012-07-20 MED ORDER — SODIUM CHLORIDE 0.9 % IV SOLN: 500.0000 mL | INTRAVENOUS | Status: DC

## 2012-07-20 NOTE — Progress Notes (Signed)
Pt. And spouse given information about [facility]/gallery curator, Medical sales representative program by [employer]. Dr. Lauro Franklin office will get in touch with referral for Cancer Center.

## 2012-07-20 NOTE — Progress Notes (Signed)
Called to room to assist during endoscopic procedure.  Patient ID and intended procedure confirmed with present staff. Received instructions for my participation in the procedure from the performing physician.  

## 2012-07-20 NOTE — Progress Notes (Signed)
Lidocaine-40mg IV prior to Propofol InductionPropofol given over incremental dosages 

## 2012-07-20 NOTE — Progress Notes (Signed)
Stacy CMA up to give pt. And spouse information [id number] financial aid, and [facility]

## 2012-07-20 NOTE — Telephone Encounter (Signed)
Called pt. Spouse to give numbers to call is complications arise after endoscopy today.  AVS left at office at pt. Discharge.

## 2012-07-20 NOTE — Op Note (Signed)
[facility] [address] [place], [address]   ENDOSCOPY PROCEDURE REPORT  PATIENT: Joseph Salinas, Joseph Salinas  MR#: [id number] BIRTHDATE: Nov 05, 1960 , 51  yrs. old GENDER: Male ENDOSCOPIST: Beverley Fiedler, MD PROCEDURE DATE:  07/20/2012 PROCEDURE:  EGD w/ biopsy ASA CLASS:     Class II INDICATIONS:  abnormal CT of the GI tract.   abdominal pain. Weight loss. MEDICATIONS: MAC sedation, administered by CRNA and propofol (Diprivan) 200mg  IV TOPICAL ANESTHETIC: none  DESCRIPTION OF PROCEDURE: After the risks benefits and alternatives of the procedure were thoroughly explained, informed consent was obtained.  The LB GIF-H180 [id number] endoscope was introduced through the mouth and advanced to the second portion of the duodenum. Without limitations.  The instrument was slowly withdrawn as the mucosa was fully examined.    ESOPHAGUS: The mucosa of the esophagus appeared normal.   A variable Z-line was observed 38 cm from the incisors.  STOMACH: Large, fungating and ulcerating malignant mass was found on the greater curvature of the gastric body. The mass was extremely friable with contact bleeding. The tumor extends proximally to about 5-10 cm from the GE junction. Multiple biopsies were performed using cold forceps.  Sample sent for histology in rushed fashion.  DUODENUM: The duodenal mucosa showed no abnormalities in the bulb and second portion of the duodenum.  Retroflexed views revealed as previously mentioned, otherwise unremarkable.     The scope was then withdrawn from the patient and the procedure completed. COMPLICATIONS: There were no complications.  ENDOSCOPIC IMPRESSION: 1.   The mucosa of the esophagus appeared normal 2.   Variable Z-line was observed 38 cm from the incisors 3.   Malignant mass was found on the greater curvature of the gastric body; multiple biopsies 4.   The duodenal mucosa showed no abnormalities in the bulb and second portion of the  duodenum  RECOMMENDATIONS: 1.  Await pathology results 2.  Increase omeprazole to 40 mg twice daily 3.  Oncology referral to Dr.  Mancel Bale 4.  Continue hydrocodone as directed and as needed for pain  eSigned:  Beverley Fiedler, MD 07/20/2012 2:41 PM               CC:The Patient

## 2012-07-20 NOTE — Progress Notes (Signed)
Patient did not experience any of the following events: a burn prior to discharge; a fall within the facility; wrong site/side/patient/procedure/implant event; or a hospital transfer or hospital admission upon discharge from the facility. (G8907) Patient did not have preoperative order for IV antibiotic SSI prophylaxis. (G8918)  

## 2012-07-20 NOTE — Patient Instructions (Addendum)
YOU HAD AN ENDOSCOPIC PROCEDURE TODAY AT THE [facility]: Refer to the procedure report that was given to you for any specific questions about what was found during the examination.  If the procedure report does not answer your questions, please call your gastroenterologist to clarify.  If you requested that your care partner not be given the details of your procedure findings, then the procedure report has been included in a sealed envelope for you to review at your convenience later.  YOU SHOULD EXPECT: Some feelings of bloating in the abdomen. Passage of more gas than usual.  Walking can help get rid of the air that was put into your GI tract during the procedure and reduce the bloating. If you had a lower endoscopy (such as a colonoscopy or flexible sigmoidoscopy) you may notice spotting of blood in your stool or on the toilet paper. If you underwent a bowel prep for your procedure, then you may not have a normal bowel movement for a few days.  DIET: Your first meal following the procedure should be a light meal and then it is ok to progress to your normal diet.  A half-sandwich or bowl of soup is an example of a good first meal.  Heavy or fried foods are harder to digest and may make you feel nauseous or bloated.  Likewise meals heavy in dairy and vegetables can cause extra gas to form and this can also increase the bloating.  Drink plenty of fluids but you should avoid alcoholic beverages for 24 hours.  ACTIVITY: Your care partner should take you home directly after the procedure.  You should plan to take it easy, moving slowly for the rest of the day.  You can resume normal activity the day after the procedure however you should NOT DRIVE or use heavy machinery for 24 hours (because of the sedation medicines used during the test).    SYMPTOMS TO REPORT IMMEDIATELY: A gastroenterologist can be reached at any hour.  During normal business hours, 8:30 AM to 5:00 PM Monday through Friday,  call [phone].  After hours and on weekends, please call the GI answering service at [phone] who will take a message and have the physician on call contact you.    Following upper endoscopy (EGD)  Vomiting of blood or coffee ground material  New chest pain or pain under the shoulder blades  Painful or persistently difficult swallowing  New shortness of breath  Fever of 100F or higher  Black, tarry-looking stools  FOLLOW UP: If any biopsies were taken you will be contacted by phone or by letter within the next 1-3 weeks.  Call your gastroenterologist if you have not heard about the biopsies in 3 weeks.  Our staff will call the home number listed on your records the next business day following your procedure to check on you and address any questions or concerns that you may have at that time regarding the information given to you following your procedure. This is a courtesy call and so if there is no answer at the home number and we have not heard from you through the emergency physician on call, we will assume that you have returned to your regular daily activities without incident.    SIGNATURES/CONFIDENTIALITY: You and/or your care partner have signed paperwork which will be entered into your electronic medical record.  These signatures attest to the fact that that the information above on your After Visit Summary has been reviewed and is understood.  Full responsibility of the confidentiality of this discharge information lies with you and/or your care-partner.   Increase omeprazole to 40 mg. Twice a day- before breakfast and supper.  May buy over the counter.  Oncology referrral to Dr. Mancel Bale   Continue hydrocodone as needed and prescribed for pain.

## 2012-07-21 ENCOUNTER — Telehealth: Payer: Self-pay | Admitting: *Deleted

## 2012-07-21 NOTE — Telephone Encounter (Signed)
  Follow up Call-  Call back number 07/20/2012 06/22/2012  Post procedure Call Back phone  # [phone] states that wife speaks [place] [phone]  Permission to leave phone message Yes Yes     Patient questions:  Do you have a fever, pain , or abdominal swelling? no Pain Score  0 *  Have you tolerated food without any problems? yes  Have you been able to return to your normal activities? yes  Do you have any questions about your discharge instructions: Diet   no Medications  no Follow up visit  no  Do you have questions or concerns about your Care? no  Actions: * If pain score is 4 or above: No action needed, pain <4.

## 2012-07-21 NOTE — Telephone Encounter (Signed)
Spoke with patient's wife by phone and confirmed appointment with Dr. Truett Perna for 07/24/12.  Contact names and phone numbers were provided.

## 2012-07-24 ENCOUNTER — Encounter: Payer: Self-pay | Admitting: Oncology

## 2012-07-24 ENCOUNTER — Ambulatory Visit (HOSPITAL_BASED_OUTPATIENT_CLINIC_OR_DEPARTMENT_OTHER): Payer: BC Managed Care – PPO | Admitting: Oncology

## 2012-07-24 ENCOUNTER — Ambulatory Visit: Payer: Self-pay

## 2012-07-24 ENCOUNTER — Other Ambulatory Visit: Payer: Self-pay | Admitting: *Deleted

## 2012-07-24 VITALS — BP 126/74 | HR 67 | Temp 99.0°F | Resp 17 | Ht 64.0 in | Wt 129.4 lb

## 2012-07-24 DIAGNOSIS — C169 Malignant neoplasm of stomach, unspecified: Secondary | ICD-10-CM

## 2012-07-24 DIAGNOSIS — R109 Unspecified abdominal pain: Secondary | ICD-10-CM

## 2012-07-24 DIAGNOSIS — R599 Enlarged lymph nodes, unspecified: Secondary | ICD-10-CM

## 2012-07-24 DIAGNOSIS — Z800 Family history of malignant neoplasm of digestive organs: Secondary | ICD-10-CM

## 2012-07-24 MED ORDER — MORPHINE SULFATE ER 15 MG PO TBCR: 15.0000 mg | EXTENDED_RELEASE_TABLET | Freq: Two times a day (BID) | ORAL | Status: DC

## 2012-07-24 NOTE — Progress Notes (Signed)
Checked in new patient. No financial issues. °

## 2012-07-24 NOTE — Progress Notes (Signed)
Met with patient and wife.  Communicated through [employer].  Educational information provided on Gastric cancer. [facility] resource information provided along with names and phone numbers.  Referral made to dietician and SW.  Request made to pathology for MSI/IHC and HER 2-Neu testing per request of Dr. Truett Perna.  Will continue to follow as needed.  Patient and wife verbalized comprehension and were without questions.

## 2012-07-24 NOTE — Progress Notes (Signed)
Checked in new patient. No financial issues right now. Per wife he has not received new card, but got info from [facility] member id# [id number] is going to be his number. I did not enter until card is recd and scanned.

## 2012-07-25 ENCOUNTER — Telehealth: Payer: Self-pay | Admitting: Oncology

## 2012-07-25 NOTE — Telephone Encounter (Signed)
, °

## 2012-07-25 NOTE — Progress Notes (Signed)
[facility] New Patient Consult   Referring MD: Jibril Mcminn 52 y.o.  [birth year]    Reason for Referral: Gastric cancer     HPI: He underwent a screening colonoscopy on 06/22/2012. He has a family history of colon cancer. Multiple polyps were found in the colon. The pathology revealed tubular and tubulovillous adenomas without high-grade dysplasia.  After the colonoscopy procedure he developed constipation and abdominal pain. The pain persisted for several weeks and was worse after eating. He saw Dr.Pyrtle on 07/17/2012. He was referred for a CT of the abdomen and pelvis on 07/18/2012. The lung bases were clear. Numerous low-density liver lesions were noted, the larger lesions appear consistent with cyst. An area of wall thickening was noted in the greater curvature of the stomach. A necrotic mass was noted to within the gastrocolic ligament. Innumerable omental and peritoneal nodules were consistent with metastatic disease. No bowel obstruction.  He was taken for an upper endoscopy on 07/20/2012. A large fungating mass was noted on the greater curvature of the gastric body. The mass was friable. The tumor extended approximately 25-10 cm from the GE junction. Multiple biopsies were performed. He was placed on Prilosec. The pathology ([id number]) revealed adenocarcinoma.  Joseph Salinas presents today with his wife and an interpreter. He continues to have mid abdominal pain. The pain is relieved with hydrocodone.  Past Medical History  Diagnosis Date  . Colon polyps-multiple tubular and tubulovillous adenomas   06/22/2012     Past Surgical History  Procedure Laterality Date  . No prior surgery      Family History  Problem Relation Age of Onset  . Colon cancer Mother 83  . Leukemia Daughter     deceased  One brother and 4 sisters. No other family history of cancer.  Current outpatient prescriptions:acetaminophen (TYLENOL) 500 MG tablet, Take 1,000 mg  by mouth every 6 (six) hours as needed for pain., Disp: , Rfl: ;  HYDROcodone-acetaminophen (NORCO/VICODIN) 5-325 MG per tablet, Take 1 tablet by mouth every 6 (six) hours as needed for pain., Disp: 45 tablet, Rfl: 0;  omeprazole (PRILOSEC) 40 MG capsule, Take 1 capsule (40 mg total) by mouth daily., Disp: 90 capsule, Rfl: 3 polyethylene glycol (MIRALAX / GLYCOLAX) packet, Take 17 g by mouth daily., Disp: , Rfl: ;  morphine (MS CONTIN) 15 MG 12 hr tablet, Take 1 tablet (15 mg total) by mouth 2 (two) times daily., Disp: 30 tablet, Rfl: 0  Allergies:  Allergies  Allergen Reactions  . Penicillins Rash    Social History: He lives in [place]. He works in Aeronautical engineer. He quit smoking cigarettes 8 weeks ago. He drinks beer on the weekends.   ROS:   Positives include: 15 pound weight loss, mid abdominal pain, soreness of a left upper to  A complete ROS was otherwise negative.  Physical Exam:  Blood pressure 126/74, pulse 67, temperature 99 F (37.2 C), temperature source Oral, resp. rate 17, height 5\' 4"  (1.626 m), weight 129 lb 6.4 oz (58.695 kg).  HEENT: There is periodontal disease at the left upper incisors with bleeding from this area after probing with a tongue blade. Oropharynx without visible mass, neck without mass Lungs: Clear bilaterally Cardiac: Regular rate and rhythm Abdomen: No hepatosplenomegaly, no mass, tender at the mid abdomen near the umbilicus. No apparent ascites. GU: 3-4 mm nodular focus at the right testicle (patient reports this is chronic)  Vascular: No leg edema Lymph nodes: No cervical, supraclavicular, axillary, or inguinal  nodes Neurologic: Alert and oriented, the motor exam appears intact in the upper and lower extremities Skin: No rash Musculoskeletal: No spine tenderness   LAB:  CBC  Lab Results  Component Value Date   WBC 7.9 07/17/2012   HGB 11.8* 07/17/2012   HCT 35.6* 07/17/2012   MCV 89.2 07/17/2012   PLT 314.0 07/17/2012     CMP        Component Value Date/Time   NA 139 07/17/2012 1528   K 4.2 07/17/2012 1528   CL 104 07/17/2012 1528   CO2 30 07/17/2012 1528   GLUCOSE 94 07/17/2012 1528   BUN 11 07/17/2012 1528   CREATININE 0.9 07/17/2012 1528   CALCIUM 8.8 07/17/2012 1528   PROT 6.8 07/17/2012 1528   ALBUMIN 3.7 07/17/2012 1528   AST 18 07/17/2012 1528   ALT 15 07/17/2012 1528   ALKPHOS 54 07/17/2012 1528   BILITOT 0.4 07/17/2012 1528   GFRNONAA >90 07/01/2012 1111   GFRAA >90 07/01/2012 1111     Radiology: I reviewed the 07/18/2012 abdomen/pelvis CT with Joseph Salinas and his wife. There is gastric wall thickening consistent with a primary tumor and multiple peritoneal nodules    Assessment/Plan:   1. Gastric cancer-status post biopsy of a gastric mass on 07/20/2012 with the pathology confirming adenocarcinoma  2. CT 07/18/2012 consistent with a primary gastric mass and metastatic omental/peritoneal nodules  3. Multiple colon polyps documented on a colonoscopy 06/22/2012-tubular adenomas, tubulovillous adenomas, and hyperplastic polyps  4. Family history of colon cancer (mother)   5. Pain secondary to the primary gastric mass or carcinomatosis  6. 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 I discussed the diagnosis and reviewed the abdominal CT with Joseph Salinas and his wife. He appears to have metastatic disease. In this case no therapy will be curative. I will present his case at the GI tumor conference on 07/26/2012. 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  Joseph Salinas has a significant amount of abdominal pain. Hydrocodone partially relieves the pain. He will begin low-dose MS Contin.  Joseph Salinas will return for an office visit 08/02/2012. We requested HER-2/neu testing on the gastric biopsy. We will discuss  systemic treatment options on 08/02/2012.  Christie Copley 07/25/2012, 1:46 PM

## 2012-07-27 ENCOUNTER — Encounter: Payer: Self-pay | Admitting: *Deleted

## 2012-07-27 ENCOUNTER — Other Ambulatory Visit: Payer: Self-pay | Admitting: Radiology

## 2012-07-27 NOTE — Progress Notes (Signed)
Clinical Social Worker received referral from patient navigator for assessment of needs.  CSW contact pt and pt's wife to offer support and assess for needs.  Pt's wife informed CSW that they had a phone interview scheduled for May 28th for pt to apply for SSD.  CSW and pt's wife discussed other resources at [facility].  Pt did not express any transportation needs at this time, as his wife will be providing transportation.  Pt's wife expressed financial concerns, due to pt now being unable to work.  CSW spoke with financial advocate and encouraged pt to meet with Financial advocate at next visit to complete application for [facility] financial assistance.  CSW, pt, and pt's wife are scheduled to meet after his appointment on 08/02/12 to further discuss resources.  CSW encouraged pt's wife to call with any questions or concerns.  Tamala Julian, MSW, LCSW Clinical Social Worker [facility] [phone]

## 2012-07-28 ENCOUNTER — Other Ambulatory Visit: Payer: Self-pay | Admitting: Radiology

## 2012-07-31 ENCOUNTER — Ambulatory Visit (HOSPITAL_COMMUNITY)
Admission: RE | Admit: 2012-07-31 | Discharge: 2012-07-31 | Disposition: A | Discharge status: Home / Self Care | Payer: BC Managed Care – PPO | Source: Ambulatory Visit | Attending: Oncology | Admitting: Oncology

## 2012-07-31 ENCOUNTER — Encounter (HOSPITAL_COMMUNITY): Payer: Self-pay

## 2012-07-31 ENCOUNTER — Ambulatory Visit (HOSPITAL_COMMUNITY): Admission: RE | Admit: 2012-07-31 | Payer: BC Managed Care – PPO | Source: Ambulatory Visit

## 2012-07-31 ENCOUNTER — Inpatient Hospital Stay (HOSPITAL_COMMUNITY): Admission: RE | Admit: 2012-07-31 | Payer: Self-pay | Source: Ambulatory Visit

## 2012-07-31 DIAGNOSIS — C169 Malignant neoplasm of stomach, unspecified: Secondary | ICD-10-CM

## 2012-07-31 HISTORY — PX: OTHER SURGICAL HISTORY: SHX169

## 2012-07-31 LAB — CBC
MCH: 28.1 pg (ref 26.0–34.0)
MCV: 83.9 fL (ref 78.0–100.0)
Platelets: 308 10*3/uL (ref 150–400)
RBC: 4.23 MIL/uL (ref 4.22–5.81)
RDW: 13 % (ref 11.5–15.5)

## 2012-07-31 MED ORDER — SODIUM CHLORIDE 0.9 % IV SOLN
Freq: Once | INTRAVENOUS | Status: AC
  Administered 2012-07-31: 08:00:00 via INTRAVENOUS

## 2012-07-31 MED ORDER — MIDAZOLAM HCL 2 MG/2ML IJ SOLN
INTRAMUSCULAR | Status: AC
  Filled 2012-07-31: qty 4

## 2012-07-31 MED ORDER — MIDAZOLAM HCL 2 MG/2ML IJ SOLN
INTRAMUSCULAR | Status: AC | PRN
  Administered 2012-07-31 (×3): 1 mg via INTRAVENOUS

## 2012-07-31 MED ORDER — HYDROCODONE-ACETAMINOPHEN 5-325 MG PO TABS
ORAL_TABLET | ORAL | Status: AC
  Filled 2012-07-31: qty 1

## 2012-07-31 MED ORDER — FENTANYL CITRATE 0.05 MG/ML IJ SOLN
INTRAMUSCULAR | Status: AC | PRN
  Administered 2012-07-31 (×3): 50 ug via INTRAVENOUS

## 2012-07-31 MED ORDER — FENTANYL CITRATE 0.05 MG/ML IJ SOLN
INTRAMUSCULAR | Status: AC
  Filled 2012-07-31: qty 4

## 2012-07-31 MED ORDER — HYDROCODONE-ACETAMINOPHEN 5-325 MG PO TABS
1.0000 | ORAL_TABLET | ORAL | Status: DC | PRN
  Administered 2012-07-31: 1 via ORAL

## 2012-07-31 NOTE — ED Notes (Signed)
Pt c/o 2/10 abd pain; one vicodin given

## 2012-07-31 NOTE — Procedures (Signed)
CT and US guided biopsy of an omental lesion.

## 2012-07-31 NOTE — H&P (Signed)
Joseph Salinas is an 52 y.o. male.   Chief Complaint: Abd pain since Feb 2014 Wt loss; constant full feeling CT reveals peritoneal nodules and omental metastasis Scheduled for peritoneal nodule biopsy HPI: wt loss; abd pain  Past Medical History  Diagnosis Date  . Colon polyp     Past Surgical History  Procedure Laterality Date  . No prior surgery      Family History  Problem Relation Age of Onset  . Colon cancer Mother 31  . Leukemia Daughter     deceased   Social History:  reports that he quit smoking about 5 weeks ago. His smoking use included Cigarettes. He smoked 0.33 packs per day. He has never used smokeless tobacco. He reports that he does not drink alcohol or use illicit drugs.  Allergies:  Allergies  Allergen Reactions  . Penicillins Rash     (Not in a hospital admission)  No results found for this or any previous visit (from the past 48 hour(s)). No results found.  Review of Systems  Constitutional: Positive for weight loss. Negative for fever.  Respiratory: Negative for shortness of breath.   Cardiovascular: Negative for chest pain.  Gastrointestinal: Positive for nausea and abdominal pain.  Neurological: Positive for weakness. Negative for dizziness and headaches.    There were no vitals taken for this visit. Physical Exam  Constitutional: He is oriented to person, place, and time. He appears well-developed and well-nourished.  Cardiovascular: Normal rate and regular rhythm.   No murmur heard. Respiratory: Effort normal and breath sounds normal. He has no wheezes.  GI: Soft. Bowel sounds are normal. There is tenderness.  Musculoskeletal: Normal range of motion.  Neurological: He is alert and oriented to person, place, and time.  Skin: Skin is warm and dry.  Psychiatric: He has a normal mood and affect. His behavior is normal. Judgment and thought content normal.     Assessment/Plan 4 month hx of abd pain and wt loss CT reveals omental and  peritoneal mets Scheduled now for peritoneal nodule biopsy Pt speaks little [place] Wife and son with him and speak English fluently All aware of procedure benefits and risks and agreeable to proceed Consent signed and in chart  Jayra Choyce A 07/31/2012, 8:18 AM

## 2012-08-02 ENCOUNTER — Other Ambulatory Visit: Payer: Self-pay | Admitting: *Deleted

## 2012-08-02 ENCOUNTER — Encounter: Payer: Self-pay | Admitting: Oncology

## 2012-08-02 ENCOUNTER — Other Ambulatory Visit: Payer: Self-pay | Admitting: Lab

## 2012-08-02 ENCOUNTER — Telehealth: Payer: Self-pay | Admitting: Oncology

## 2012-08-02 ENCOUNTER — Ambulatory Visit (HOSPITAL_BASED_OUTPATIENT_CLINIC_OR_DEPARTMENT_OTHER): Payer: BC Managed Care – PPO | Admitting: Oncology

## 2012-08-02 VITALS — BP 110/64 | HR 72 | Temp 97.9°F | Resp 18 | Ht 65.0 in | Wt 124.3 lb

## 2012-08-02 DIAGNOSIS — C169 Malignant neoplasm of stomach, unspecified: Secondary | ICD-10-CM

## 2012-08-02 DIAGNOSIS — G893 Neoplasm related pain (acute) (chronic): Secondary | ICD-10-CM

## 2012-08-02 DIAGNOSIS — Z9889 Other specified postprocedural states: Secondary | ICD-10-CM

## 2012-08-02 DIAGNOSIS — R109 Unspecified abdominal pain: Secondary | ICD-10-CM

## 2012-08-02 DIAGNOSIS — C772 Secondary and unspecified malignant neoplasm of intra-abdominal lymph nodes: Secondary | ICD-10-CM

## 2012-08-02 DIAGNOSIS — R630 Anorexia: Secondary | ICD-10-CM

## 2012-08-02 MED ORDER — SENNA-DOCUSATE SODIUM 8.6-50 MG PO TABS: 1.0000 | ORAL_TABLET | Freq: Two times a day (BID) | ORAL | Status: DC

## 2012-08-02 MED ORDER — HYDROCODONE-ACETAMINOPHEN 5-325 MG PO TABS: 1.0000 | ORAL_TABLET | Freq: Four times a day (QID) | ORAL | Status: DC | PRN

## 2012-08-02 MED ORDER — METOCLOPRAMIDE HCL 10 MG PO TABS: 10.0000 mg | ORAL_TABLET | Freq: Three times a day (TID) | ORAL | Status: DC

## 2012-08-02 MED ORDER — MORPHINE SULFATE ER 15 MG PO TBCR: 15.0000 mg | EXTENDED_RELEASE_TABLET | Freq: Two times a day (BID) | ORAL | Status: DC

## 2012-08-02 NOTE — Progress Notes (Signed)
I will give the patient an EPP. Joseph Bible is needing assistance with meds. If they qualify for assistance, he would get 400.00 grant.= Joseph Salinas will bring him to me.

## 2012-08-02 NOTE — Progress Notes (Signed)
Patient's wife came back with EPP. 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 She will have person that done their taxes call me and possibly fax over. Then I can process and he will be elig possibly for 400.00 grant to get some meds.

## 2012-08-02 NOTE — Progress Notes (Signed)
   [facility]    OFFICE PROGRESS NOTE   INTERVAL HISTORY:   He returns as scheduled. He underwent an ultrasound-guided biopsy of a peritoneal nodule on 07/31/2012. The pathology returned positive for metastatic adenocarcinoma.  MS Contin has helped the pain. He continues to have increased pain after keeping. He reports fullness after eating and anorexia. He also has nausea. He reports constipation despite MiraLAX. He returns today with his wife, sister, and an interpreter.  Objective:  Vital signs in last 24 hours:  Blood pressure 110/64, pulse 72, temperature 97.9 F (36.6 C), temperature source Oral, resp. rate 18, height 5\' 5"  (1.651 m), weight 124 lb 4.8 oz (56.382 kg), SpO2 98.00%.   Resp: lungs clear bilaterally Cardio: regular rate and rhythm GI: no hepatomegaly, nontender, no mass Vascular:  No leg edema   Lab Results:  Lab Results  Component Value Date   WBC 5.6 07/31/2012   HGB 11.9* 07/31/2012   HCT 35.5* 07/31/2012   MCV 83.9 07/31/2012   PLT 308 07/31/2012      Medications: I have reviewed the patient's current medications.  Assessment/Plan: 1.MetastaticGastric cancer-status post biopsy of a gastric mass on 07/20/2012 with the pathology confirming adenocarcinoma , HER-2 non-amplified - CT 07/18/2012 consistent with a primary gastric mass and metastatic omental/peritoneal nodules  -biopsy of a peritoneal nodule 07/31/2012 consistent with metastatic adenocarcinoma 3. Multiple colon polyps documented on a colonoscopy 06/22/2012-tubular adenomas, tubulovillous adenomas, and hyperplastic polyps  4. Family history of colon cancer (mother)  5. Pain secondary to the primary gastric mass or carcinomatosis -improved with MS Contin 6. 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 Anorexia, nausea, and early satiety-the plan is to begin a trial of Reglan 8. Constipation secondary to narcotics and  carcinomatosis-he will continue MiraLAX and begin Senoko  Disposition:  Mr. Vandeberg has been diagnosed with metastatic gastric cancer. I discussed the diagnosis and treatment options with Mr. Badley and his family.No therapy will be curative. There is no indication for surgery. I recommend palliative systemic chemotherapy.he will not benefit from Herceptin.  I recommend FOLFOX chemotherapy. We reviewed the potential toxicities associated with this regimen including the chance for nausea/vomiting, mucositis, diarrhea, and hematologic toxicity. We discussed the skin hyperpigmentation and hand/foot syndrome associated with 5-fluorouracil. We reviewed the various types of neuropathy associated with oxaliplatin. He will attend a chemotherapy teaching class.  Mr. Pavao will be referred for placement of a Port-A-Cath. A first cycle of FOLFOX is scheduled for 08/09/2012. He will return for an office visit and cycle 2 on 08/23/2012.  He will continue MS Contin and hydrocodone for pain. He will begin a trial of Reglan for the early satiety and nausea. We will add Megace or prednisone if he continues to lose weight.   Thornton Papas, MD  08/02/2012  2:52 PM

## 2012-08-02 NOTE — Progress Notes (Signed)
Met with patient, family, and interpreter to assess for needs.  Patient requesting financial assistance with medications.  Patient stated he has not received appointment for dietician.  This RN connected the patient with FC to work on financial assistance and put urgent request in for patient to be seen by dietician.  No other needs were identified at present time.  Confirmed with patient and family the contact names and numbers for [facility], Dr. Truett Perna, and this RN.

## 2012-08-02 NOTE — Telephone Encounter (Signed)
gv and printed appt sched and avs for pt....sched pt for nut., lvm for Dr. Lurene Shadow office...lvm for Tina in IR...pt ok and aware...emailed MW to add tx

## 2012-08-03 ENCOUNTER — Telehealth: Payer: Self-pay | Admitting: Oncology

## 2012-08-03 ENCOUNTER — Other Ambulatory Visit: Payer: BC Managed Care – PPO

## 2012-08-03 ENCOUNTER — Other Ambulatory Visit: Payer: Self-pay | Admitting: *Deleted

## 2012-08-03 ENCOUNTER — Encounter: Payer: Self-pay | Admitting: *Deleted

## 2012-08-03 ENCOUNTER — Ambulatory Visit: Payer: BC Managed Care – PPO | Admitting: Nutrition

## 2012-08-03 ENCOUNTER — Telehealth: Payer: Self-pay | Admitting: *Deleted

## 2012-08-03 ENCOUNTER — Other Ambulatory Visit (HOSPITAL_BASED_OUTPATIENT_CLINIC_OR_DEPARTMENT_OTHER): Payer: BC Managed Care – PPO | Admitting: Lab

## 2012-08-03 DIAGNOSIS — C169 Malignant neoplasm of stomach, unspecified: Secondary | ICD-10-CM

## 2012-08-03 LAB — CBC WITH DIFFERENTIAL/PLATELET
Eosinophils Absolute: 0.2 10*3/uL (ref 0.0–0.5)
HCT: 36.3 % — ABNORMAL LOW (ref 38.4–49.9)
LYMPH%: 23.6 % (ref 14.0–49.0)
MCV: 85.4 fL (ref 79.3–98.0)
MONO%: 10.5 % (ref 0.0–14.0)
NEUT#: 2.9 10*3/uL (ref 1.5–6.5)
NEUT%: 61.9 % (ref 39.0–75.0)
Platelets: 309 10*3/uL (ref 140–400)
RBC: 4.25 10*6/uL (ref 4.20–5.82)
nRBC: 0 % (ref 0–0)

## 2012-08-03 LAB — COMPREHENSIVE METABOLIC PANEL (CC13)
AST: 9 U/L (ref 5–34)
Alkaline Phosphatase: 62 U/L (ref 40–150)
BUN: 11.2 mg/dL (ref 7.0–26.0)
Creatinine: 0.9 mg/dL (ref 0.7–1.3)
Potassium: 4.1 mEq/L (ref 3.5–5.1)
Total Bilirubin: 0.24 mg/dL (ref 0.20–1.20)

## 2012-08-03 MED ORDER — PROCHLORPERAZINE MALEATE 10 MG PO TABS: 10.0000 mg | ORAL_TABLET | Freq: Four times a day (QID) | ORAL | Status: DC | PRN

## 2012-08-03 MED ORDER — LIDOCAINE-PRILOCAINE 2.5-2.5 % EX CREA: TOPICAL_CREAM | CUTANEOUS | Status: DC | PRN

## 2012-08-03 NOTE — Progress Notes (Signed)
Sent In Basket message to radiology requesting they leave his PAC accessed on 5/20 for his 5/21 chemo treatment.

## 2012-08-03 NOTE — Telephone Encounter (Signed)
Gave pt appt for lab, md and chemo for June and May 2014

## 2012-08-03 NOTE — Progress Notes (Signed)
Patient is a 52 year old male diagnosed with gastric cancer. He is a patient of Dr. Truett Perna.  Past medical history not significant.  Medications include Reglan, Prilosec, MiraLax, and Senokot S.  Labs include albumin 3.2 on May 15.  Height: 65 inches. Weight: 124.3 pounds. Usual body weight: 145 pounds per patient. BMI: 20.68.  I met today with patient, wife, and interpreter. Patient describes fullness after eating, anorexia, constipation, and weight loss. Patient is very concerned about 20 pound weight loss and wishes to know what he can do to improve his oral intake.  Nutrition diagnosis: Unintended weight loss related to diagnosis of gastric cancer and associated treatments as evidenced by 14% weight loss from usual body weight.  Intervention: Patient was educated to consume small, frequent, high-calorie and protein meals and snacks throughout the day. I have reviewed high-protein foods with patient. I have encouraged patient and wife to explore a variety of oral nutrition supplements for added calories and protein. I have stressed the importance of bowel management. Patient has been provided with a Spanish eating hints book. All questions were answered. Teach back method used.  Monitoring, evaluation, goals: Patient will tolerate increased calories and protein to minimize further weight loss.  Next visit: Wednesday, May 21 during chemotherapy.

## 2012-08-03 NOTE — Telephone Encounter (Signed)
Per staff message and POF I have scheduled appts.  JMW  

## 2012-08-04 ENCOUNTER — Other Ambulatory Visit: Payer: Self-pay | Admitting: Radiology

## 2012-08-04 ENCOUNTER — Encounter (HOSPITAL_COMMUNITY): Payer: Self-pay | Admitting: Pharmacy Technician

## 2012-08-08 ENCOUNTER — Ambulatory Visit (HOSPITAL_COMMUNITY)
Admission: RE | Admit: 2012-08-08 | Discharge: 2012-08-08 | Disposition: A | Discharge status: Home / Self Care | Payer: BC Managed Care – PPO | Source: Ambulatory Visit | Attending: Oncology | Admitting: Oncology

## 2012-08-08 ENCOUNTER — Other Ambulatory Visit: Payer: Self-pay | Admitting: Oncology

## 2012-08-08 ENCOUNTER — Encounter (HOSPITAL_COMMUNITY): Payer: Self-pay

## 2012-08-08 DIAGNOSIS — C169 Malignant neoplasm of stomach, unspecified: Secondary | ICD-10-CM

## 2012-08-08 DIAGNOSIS — Z87891 Personal history of nicotine dependence: Secondary | ICD-10-CM | POA: Insufficient documentation

## 2012-08-08 DIAGNOSIS — Z79899 Other long term (current) drug therapy: Secondary | ICD-10-CM | POA: Insufficient documentation

## 2012-08-08 DIAGNOSIS — C801 Malignant (primary) neoplasm, unspecified: Secondary | ICD-10-CM | POA: Insufficient documentation

## 2012-08-08 HISTORY — PX: OTHER SURGICAL HISTORY: SHX169

## 2012-08-08 MED ORDER — VANCOMYCIN HCL IN DEXTROSE 1-5 GM/200ML-% IV SOLN
1000.0000 mg | INTRAVENOUS | Status: AC
  Administered 2012-08-08: 1000 mg via INTRAVENOUS
  Filled 2012-08-08: qty 200

## 2012-08-08 MED ORDER — MIDAZOLAM HCL 2 MG/2ML IJ SOLN
INTRAMUSCULAR | Status: AC | PRN
  Administered 2012-08-08: 2 mg via INTRAVENOUS

## 2012-08-08 MED ORDER — SODIUM CHLORIDE 0.9 % IV SOLN
INTRAVENOUS | Status: DC
  Administered 2012-08-08: 10:00:00 via INTRAVENOUS

## 2012-08-08 MED ORDER — LIDOCAINE HCL 1 % IJ SOLN
INTRAMUSCULAR | Status: AC
  Filled 2012-08-08: qty 20

## 2012-08-08 MED ORDER — FENTANYL CITRATE 0.05 MG/ML IJ SOLN
INTRAMUSCULAR | Status: AC
  Filled 2012-08-08: qty 6

## 2012-08-08 MED ORDER — MIDAZOLAM HCL 2 MG/2ML IJ SOLN
INTRAMUSCULAR | Status: AC
  Filled 2012-08-08: qty 6

## 2012-08-08 MED ORDER — FENTANYL CITRATE 0.05 MG/ML IJ SOLN
INTRAMUSCULAR | Status: AC | PRN
  Administered 2012-08-08: 50 ug via INTRAVENOUS
  Administered 2012-08-08: 100 ug via INTRAVENOUS

## 2012-08-08 NOTE — H&P (Signed)
Joseph Salinas is an 52 y.o. male.   Chief Complaint: Abd pain since Feb 2014 HPI: Recently diagnosed with metastatic gastric cancer. Id set to start chemotherapy tomorrow and is scheduled for port placement today.  PMHx and meds reviewed. Feels well, no fevers, recent illness.  Past Medical History  Diagnosis Date  . Colon polyp     Past Surgical History  Procedure Laterality Date  . No prior surgery      Family History  Problem Relation Age of Onset  . Colon cancer Mother 13  . Leukemia Daughter     deceased   Social History:  reports that he quit smoking about 7 weeks ago. His smoking use included Cigarettes. He smoked 0.33 packs per day. He has never used smokeless tobacco. He reports that he does not drink alcohol or use illicit drugs.  Allergies:  Allergies  Allergen Reactions  . Penicillins Rash    Medications: acetaminophen (TYLENOL) 500 MG tablet (Taking) Sig - Route: Take 1,000 mg by mouth every 6 (six) hours as needed for pain. - Oral Class: Historical Med Number of times this order has been changed since signing: 1 Order [address] Calcium Carbonate Antacid (ALKA-SELTZER ANTACID PO) (Taking) Sig - Route: Take 2 tablets by mouth 3 (three) times daily as needed (upset stomach). - Oral Class: Historical Med HYDROcodone-acetaminophen (NORCO/VICODIN) 5-325 MG per tablet (Taking) 08/02/2012 Sig - Route: Take 1 tablet by mouth every 6 (six) hours as needed for pain. - Oral Class: Historical Med lidocaine-prilocaine (EMLA) cream (Taking) 08/03/2012 Sig - Route: Apply 1 application topically as needed. Apply 1 tsp to PAC site 1-2 hours prior to stick and cover with plastic wrap to numb site - Topical Class: Historical Med metoCLOPramide (REGLAN) 10 MG tablet (Taking) 08/02/2012 Sig - Route: Take 10 mg by mouth 3 (three) times daily before meals. - Oral Class: Historical Med morphine (MS CONTIN) 15 MG 12 hr tablet (Taking) 08/02/2012 Sig - Route: Take 15 mg by mouth 2 (two) times daily.  - Oral Class: Historical Med omeprazole (PRILOSEC) 40 MG capsule (Taking) 07/17/2012 Sig - Route: Take 40 mg by mouth daily. - Oral Class: Historical Med polyethylene glycol (MIRALAX / GLYCOLAX) packet (Taking) Sig - Route: Take 17 g by mouth daily. - Oral Class: Historical Med Number of times this order has been changed since signing: 1 Order [address] prochlorperazine (COMPAZINE) 10 MG tablet (Taking) 08/03/2012 Sig - Route: Take 10 mg by mouth every 6 (six) hours as needed (nausea). - Oral Class: Historical Med sennosides-docusate sodium (SENOKOT-S) 8.6-50 MG tablet (Taking) 08/02/2012 Sig - Route: Take 1 tablet by mouth 2 (two) times daily  Review of Systems  Constitutional: Positive for weight loss. Negative for fever.  Respiratory: Negative for shortness of breath.   Cardiovascular: Negative for chest pain.  Gastrointestinal: Positive for nausea and abdominal pain.  Neurological: Positive for weakness. Negative for dizziness and headaches.    Temp: 98.3, HR: 59, BP: 111/69, O2: 98% Physical Exam  Constitutional: He is oriented to person, place, and time. He appears well-developed and well-nourished.  Cardiovascular: Normal rate and regular rhythm.   No murmur heard. Respiratory: Effort normal and breath sounds normal. He has no wheezes.  GI: Soft. Bowel sounds are normal.  Musculoskeletal: Normal range of motion.  Neurological: He is alert and oriented to person, place, and time.  Skin: Skin is warm and dry.  Psychiatric: He has a normal mood and affect. His behavior is normal. Judgment and thought content normal.  Assessment/Plan Metastatic gastric cancer For port placement Discussed procedure with pt and wife. Interpreter assisting All aware of procedure benefits and risks and agreeable to proceed Labs reviewed from recent Bx, stable, no need to repeat. Consent signed and in chart   Brayton El 08/08/2012, 10:36 AM

## 2012-08-08 NOTE — Procedures (Signed)
R IJ Port No complication No blood loss. See complete dictation in [facility].  

## 2012-08-09 ENCOUNTER — Other Ambulatory Visit: Payer: BC Managed Care – PPO | Admitting: Lab

## 2012-08-09 ENCOUNTER — Ambulatory Visit (HOSPITAL_BASED_OUTPATIENT_CLINIC_OR_DEPARTMENT_OTHER): Payer: BC Managed Care – PPO

## 2012-08-09 ENCOUNTER — Telehealth: Payer: Self-pay | Admitting: *Deleted

## 2012-08-09 ENCOUNTER — Encounter: Payer: Self-pay | Admitting: Oncology

## 2012-08-09 ENCOUNTER — Other Ambulatory Visit: Payer: Self-pay | Admitting: Oncology

## 2012-08-09 ENCOUNTER — Ambulatory Visit: Payer: BC Managed Care – PPO | Admitting: Nutrition

## 2012-08-09 VITALS — BP 120/78 | HR 68 | Temp 98.3°F

## 2012-08-09 DIAGNOSIS — C169 Malignant neoplasm of stomach, unspecified: Secondary | ICD-10-CM

## 2012-08-09 DIAGNOSIS — Z5111 Encounter for antineoplastic chemotherapy: Secondary | ICD-10-CM

## 2012-08-09 HISTORY — DX: Malignant neoplasm of stomach, unspecified: C16.9

## 2012-08-09 MED ORDER — FLUOROURACIL CHEMO INJECTION 2.5 GM/50ML
400.0000 mg/m2 | Freq: Once | INTRAVENOUS | Status: AC
  Administered 2012-08-09: 600 mg via INTRAVENOUS
  Filled 2012-08-09: qty 12

## 2012-08-09 MED ORDER — SODIUM CHLORIDE 0.9 % IV SOLN
2400.0000 mg/m2 | INTRAVENOUS | Status: DC
  Administered 2012-08-09: 3750 mg via INTRAVENOUS
  Filled 2012-08-09: qty 75

## 2012-08-09 MED ORDER — ONDANSETRON 8 MG/50ML IVPB (CHCC)
8.0000 mg | Freq: Once | INTRAVENOUS | Status: AC
  Administered 2012-08-09: 8 mg via INTRAVENOUS

## 2012-08-09 MED ORDER — OXALIPLATIN CHEMO INJECTION 100 MG/20ML
85.0000 mg/m2 | Freq: Once | INTRAVENOUS | Status: AC
  Administered 2012-08-09: 135 mg via INTRAVENOUS
  Filled 2012-08-09: qty 27

## 2012-08-09 MED ORDER — LEUCOVORIN CALCIUM INJECTION 350 MG
400.0000 mg/m2 | Freq: Once | INTRAVENOUS | Status: AC
  Administered 2012-08-09: 624 mg via INTRAVENOUS
  Filled 2012-08-09: qty 31.2

## 2012-08-09 MED ORDER — DEXTROSE 5 % IV SOLN
Freq: Once | INTRAVENOUS | Status: AC
  Administered 2012-08-09: 12:00:00 via INTRAVENOUS

## 2012-08-09 MED ORDER — DEXAMETHASONE SODIUM PHOSPHATE 10 MG/ML IJ SOLN
10.0000 mg | Freq: Once | INTRAMUSCULAR | Status: AC
  Administered 2012-08-09: 10 mg via INTRAVENOUS

## 2012-08-09 NOTE — Telephone Encounter (Signed)
Asking for Dr. Truett Perna to let him know when patient will be up to a 1 1/2 hour dental exam & xrays? Will he need an interpreter?

## 2012-08-09 NOTE — Patient Instructions (Signed)
[facility] Discharge Instructions for Patients Receiving Chemotherapy  Today you received the following chemotherapy agents FOLFOX  To help prevent nausea and vomiting after your treatment, we encourage you to take your nausea medication.   If you develop nausea and vomiting that is not controlled by your nausea medication, call the clinic. If it is after clinic hours your family physician or the after hours number for the clinic or go to the Emergency Department.   BELOW ARE SYMPTOMS THAT SHOULD BE REPORTED IMMEDIATELY:  *FEVER GREATER THAN 100.5 F  *CHILLS WITH OR WITHOUT FEVER  NAUSEA AND VOMITING THAT IS NOT CONTROLLED WITH YOUR NAUSEA MEDICATION  *UNUSUAL SHORTNESS OF BREATH  *UNUSUAL BRUISING OR BLEEDING  TENDERNESS IN MOUTH AND THROAT WITH OR WITHOUT PRESENCE OF ULCERS  *URINARY PROBLEMS  *BOWEL PROBLEMS  UNUSUAL RASH Items with * indicate a potential emergency and should be followed up as soon as possible.  One of the nurses will contact you 24 hours after your treatment. Please let the nurse know about any problems that you may have experienced. Feel free to call the clinic you have any questions or concerns. The clinic phone number is [phone].   I have been informed and understand all the instructions given to me. I know to contact the clinic, my physician, or go to the Emergency Department if any problems should occur. I do not have any questions at this time, but understand that I may call the clinic during office hours   should I have any questions or need assistance in obtaining follow up care.    __________________________________________  _____________  __________ Signature of Patient or Authorized Representative            Date                   Time    __________________________________________ Nurse's Signature

## 2012-08-09 NOTE — Progress Notes (Signed)
Patient, wife and interpreter present for nutrition followup. Patient's weight is stable and was documented as 124 pounds today. He reports that he feels like he is eating more than he was. He continues to have pain with eating as well as early satiety. His appetite remains poor. His constipation is resolved.  Nutrition diagnosis: Unintended weight loss has improved.  Intervention: Patient was educated to increase oral nutrition supplements to 3 times a day between meals. I have provided him with samples of ensure clear. I've educated him on consuming smaller meals and snacks throughout the day. I've answered his questions. Teach back method used.  Monitoring, evaluation, goals: Patient will tolerate adequate calories and protein to minimize weight loss.  Next visit: Wednesday, June 4.

## 2012-08-10 ENCOUNTER — Encounter (HOSPITAL_COMMUNITY): Payer: Self-pay | Admitting: Dentistry

## 2012-08-10 ENCOUNTER — Ambulatory Visit (HOSPITAL_COMMUNITY): Payer: Self-pay | Admitting: Dentistry

## 2012-08-10 ENCOUNTER — Telehealth: Payer: Self-pay | Admitting: *Deleted

## 2012-08-10 VITALS — BP 106/67 | HR 69 | Temp 98.5°F | Ht 63.0 in | Wt 124.0 lb

## 2012-08-10 DIAGNOSIS — K036 Deposits [accretions] on teeth: Secondary | ICD-10-CM

## 2012-08-10 DIAGNOSIS — C169 Malignant neoplasm of stomach, unspecified: Secondary | ICD-10-CM

## 2012-08-10 DIAGNOSIS — K08109 Complete loss of teeth, unspecified cause, unspecified class: Secondary | ICD-10-CM

## 2012-08-10 DIAGNOSIS — K0530 Chronic periodontitis, unspecified: Secondary | ICD-10-CM

## 2012-08-10 DIAGNOSIS — M264 Malocclusion, unspecified: Secondary | ICD-10-CM

## 2012-08-10 DIAGNOSIS — Z0189 Encounter for other specified special examinations: Secondary | ICD-10-CM

## 2012-08-10 DIAGNOSIS — K0889 Other specified disorders of teeth and supporting structures: Secondary | ICD-10-CM

## 2012-08-10 MED ORDER — CHLORHEXIDINE GLUCONATE 0.12 % MT SOLN: OROMUCOSAL | Status: DC

## 2012-08-10 NOTE — Patient Instructions (Signed)
I will discuss the dental findings and need for multiple extractions with alveoloplasty in the operating room with Dr. Truett Perna. Dental procedures will then need to be coordinated with the current active chemotherapy regimen. In the meantime, the patient is to use chlorhexidine rinses and a twice a day manner after breakfast and at bedtime as instructed. This Rx was called into [facility] long outpatient pharmacy with refills. Patient is to call dental medicine or Dr. Truett Perna if acute dental swelling arises that may require antibiotic therapy.  Dr. Kristin Bruins

## 2012-08-10 NOTE — Telephone Encounter (Signed)
Message copied by Augusto Garbe on Thu Aug 10, 2012  3:59 PM ------      Message from: Carola Rhine A      Created: Wed Aug 09, 2012 11:55 AM      Regarding: chemo follow up call      Contact: [phone]       1st FOLFOX, please call to make sure he is OK with the pump. Wife speaks english.Thanks,Tammi ------

## 2012-08-10 NOTE — Telephone Encounter (Signed)
No new orders received from Dr. Truett Perna.  Suggested patient try the prochlorperazine, otherwise let it run it's course.  Called [name] with these instructions.

## 2012-08-10 NOTE — Progress Notes (Signed)
DENTAL CONSULTATION  Date of Consultation:  08/10/2012 Patient Name:   Joseph Salinas Date of Birth:   [birth year] Medical Record Number: [id number]  VITALS: BP 106/67  Pulse 69  Temp(Src) 98.5 F (36.9 C) (Oral)  Ht 5\' 3"  (1.6 m)  Wt 124 lb (56.246 kg)  BMI 21.97 kg/m2   CHIEF COMPLAINT: Patient was referred by Dr. Truett Perna for a dental consultation.  HPI: Joseph Salinas is a 52 year old male recently diagnosed with gastric cancer. Patient started his initial FOLFOX chemotherapy regimen on 08/09/2012. A dental consultation was requested by Dr. Truett Perna to rule out dental infection that may affect the patient's systemic health while undergoing active chemotherapy.  The currently denies having an acute toothache. Patient does have history of lower left and upper left swelling 4-5 days ago. Patient indicates that it never hurt and the swelling disappeared after it "burst open". Patient points to tooth #10 on the facial and 18 on the lingual as the offending areas. The patient last saw a dentist in 1998 for an exam, cleaning, several fillings, and a root canal therapy on #18. This was in [place].   The patient moved to [place] approximately 11 years ago to be closer to [facility] for treatment of leukemia for his daughter. The patient has no regular primary dentist in [place].  Patient Active Problem List   Diagnosis Date Noted  . Gastric cancer 08/09/2012  . Hx of adenomatous colonic polyps 07/04/2012   PMH: Past Medical History  Diagnosis Date  . Colon polyp   . Gastric cancer 08/09/2012    PSH: Past Surgical History  Procedure Laterality Date  . No prior surgery      ALLERGIES: Allergies  Allergen Reactions  . Penicillins Rash    MEDICATIONS: Current Outpatient Prescriptions  Medication Sig Dispense Refill  . acetaminophen (TYLENOL) 500 MG tablet Take 1,000 mg by mouth every 6 (six) hours as needed for pain.      . Calcium Carbonate  Antacid (ALKA-SELTZER ANTACID PO) Take 2 tablets by mouth 3 (three) times daily as needed (upset stomach).      Marland Kitchen HYDROcodone-acetaminophen (NORCO/VICODIN) 5-325 MG per tablet Take 1 tablet by mouth every 6 (six) hours as needed for pain.      Marland Kitchen lidocaine-prilocaine (EMLA) cream Apply 1 application topically as needed. Apply 1 tsp to [facility] site 1-2 hours prior to stick and cover with plastic wrap to numb site      . metoCLOPramide (REGLAN) 10 MG tablet Take 10 mg by mouth 3 (three) times daily before meals.      Marland Kitchen morphine (MS CONTIN) 15 MG 12 hr tablet Take 15 mg by mouth 2 (two) times daily.      Marland Kitchen omeprazole (PRILOSEC) 40 MG capsule Take 40 mg by mouth daily.      . polyethylene glycol (MIRALAX / GLYCOLAX) packet Take 17 g by mouth daily.      . prochlorperazine (COMPAZINE) 10 MG tablet Take 10 mg by mouth every 6 (six) hours as needed (nausea).      . sennosides-docusate sodium (SENOKOT-S) 8.6-50 MG tablet Take 1 tablet by mouth 2 (two) times daily.       No current facility-administered medications for this visit.    LABS: Lab Results  Component Value Date   WBC 4.8 08/03/2012   HGB 11.9* 08/03/2012   HCT 36.3* 08/03/2012   MCV 85.4 08/03/2012   PLT 309 08/03/2012      Component Value Date/Time  NA 140 08/03/2012 0940   NA 139 07/17/2012 1528   K 4.1 08/03/2012 0940   K 4.2 07/17/2012 1528   CL 100 08/03/2012 0940   CL 104 07/17/2012 1528   CO2 32* 08/03/2012 0940   CO2 30 07/17/2012 1528   GLUCOSE 81 08/03/2012 0940   GLUCOSE 94 07/17/2012 1528   BUN 11.2 08/03/2012 0940   BUN 11 07/17/2012 1528   CREATININE 0.9 08/03/2012 0940   CREATININE 0.9 07/17/2012 1528   CALCIUM 9.5 08/03/2012 0940   CALCIUM 8.8 07/17/2012 1528   GFRNONAA >90 07/01/2012 1111   GFRAA >90 07/01/2012 1111   Lab Results  Component Value Date   INR 1.03 07/31/2012   No results found for this basename: PTT    SOCIAL HISTORY: History   Social History  . Marital Status: Married    Spouse Name: N/A    Number of  Children: 2  . Years of Education: N/A   Occupational History  . Not on file.   Social History Main Topics  . Smoking status: Former Smoker -- 0.33 packs/day    Types: Cigarettes    Quit date: 06/20/2012  . Smokeless tobacco: Never Used  . Alcohol Use: No     Comment: not in 1 month;before that 6 beers on a weekend  . Drug Use: No  . Sexually Active: Not on file   Other Topics Concern  . Not on file   Social History Narrative  . No narrative on file    FAMILY HISTORY: Family History  Problem Relation Age of Onset  . Colon cancer Mother 45  . Leukemia Daughter     deceased     REVIEW OF SYSTEMS: Reviewed with patient and wife and included in dental record.  DENTAL HISTORY: CHIEF COMPLAINT: Patient was referred by Dr. Truett Perna for a dental consultation.  HPI: Joseph Salinas is a 52 year old male recently diagnosed with gastric cancer. Patient started his initial FOLFOX chemotherapy regimen on 08/09/2012. A dental consultation was requested by Dr. Truett Perna to rule out dental infection that may affect the patient's systemic health while undergoing active chemotherapy.  The currently denies having an acute toothache. Patient does have history of lower left and upper left swelling 4-5 days ago. Patient indicates that it never hurt and the swelling disappeared after it "burst open". Patient points to tooth #10 on the facial and 18 on the lingual as the offending areas. The patient last saw a dentist in 1998 for an exam, cleaning, several fillings, and a root canal therapy on #18. This was in [place].   The patient moved to [place] approximately 11 years ago to be closer to [facility] for treatment of leukemia for his daughter. The patient has no regular primary dentist in [place].   DENTAL EXAMINATION:  GENERAL: The patient is a well-developed, well-nourished male in no acute distress. HEAD AND NECK: There is no palpable submandibular  lymphadenopathy. The patient denies acute TMJ symptoms.  INTRAORAL EXAM: The patient has normal saliva. The patient has what appears to be a previous periodontal abscess associated with tooth #10 on the facial aspect.  Some residual purulence is noted in this area. DENTITION: The patient is missing tooth numbers 3, 19, 30, and 31.  PERIODONTAL: The patient has chronic periodontitis with plaque and calculus accumulations, gingival recession, tooth mobility, and moderate to severe bone loss.  DENTAL CARIES/SUBOPTIMAL RESTORATIONS: Multiple dental caries are noted as per dental charting form. ENDODONTIC: Patient currently denies  acute pulpitis symptoms. The pateint has had previous root canal therapy associated with tooth #18. That root canal therapies is now exposed to the oral cavity. CROWN AND BRIDGE: There are no crown or bridge restorations. PROSTHODONTIC: The patient has no partial dentures. OCCLUSION: The patient has a poor occlusal scheme secondary to multiple missing teeth, supra-eruption and drifting of the unopposed teeth into the edentulous areas, and lack of replacement of the missing teeth with dental prostheses.  RADIOGRAPHIC INTERPRETATION:  An orthopantogram was taken and supplemented with a full series of dental radiographs. There are multiple missing teeth. There is supra-eruption and drifting of the unopposed teeth into the edentulous areas. There is moderate to severe horizontal and vertical bone loss. Multiple diastemas are noted. Tooth #18 has had a previous root canal therapy. Dental caries are noted.   ASSESSMENTS: 1. Chronic periodontitis with bone loss 2. Gingival recession 3. Plaque and calculus accumulations 4. Tooth mobility  5. Dental caries 6. Evidence of periodontal abscess in the area of #10 on the facial aspect. 7. Multiple missing teeth 8. Multiple diastemas 9. Supra-eruption and drifting of the unopposed teeth into the edentulous areas 10. No partial  dentures 11. Poor occlusal scheme and malocclusion  12.  Current chemotherapy with the risk for infection and bleeding with invasive dental procedures.   PLAN/RECOMMENDATIONS: 1. I discussed the risks, benefits, and complications of various treatment options with the patient and his wife in relationship to his medical and dental conditions, current chemotherapy, and risk for infection, and bleeding. We discussed various treatment options to include no treatment, multiple extractions with alveoloplasty, pre-prosthetic surgery as indicated, periodontal therapy, dental restorations, root canal therapy, crown and bridge therapy, implant therapy, and replacement of missing teeth as indicated. The patient and his wife currently wish to proceed with extraction of tooth numbers 1, 2, 10, 14, 15, 16, 17, 18, 23, 24, 25, 26, and 32 with alveoloplasty and gross debridement of remaining dentition in the operating room with general anesthesia. I will need to discuss the medical stability of the patient and  timing of these procedures with Dr. Truett Perna. Operating room procedure will then be coordinated with Dr. Truett Perna as indicated.  In the meantime, I will start the patient on chlorhexidine rinses for twice a day use to aid in disinfection of the oral cavity. Patient is to contact Dental Medicine or Dr. Truett Perna for possible anti biotic therapy if significant swelling arises from the mouth before dental procedures can be performed.   2. Discussion of findings with medical team and coordination of future medical and dental care as indicated.   Charlynne Pander, DDS

## 2012-08-10 NOTE — Telephone Encounter (Signed)
Spoke with wife Harland German, who says he is hiccupping every five minutes.  Was not able to sleep last night or rest today.  Uses [employer] on [address] or the [employer].  He's been able to eat and drink without any problems.  No furher symptoms reported and no further questions.  [name] can be reached at [phone].

## 2012-08-11 ENCOUNTER — Ambulatory Visit (HOSPITAL_BASED_OUTPATIENT_CLINIC_OR_DEPARTMENT_OTHER): Payer: BC Managed Care – PPO

## 2012-08-11 ENCOUNTER — Encounter: Payer: Self-pay | Admitting: Oncology

## 2012-08-11 VITALS — BP 99/68 | HR 80 | Temp 97.7°F

## 2012-08-11 DIAGNOSIS — C169 Malignant neoplasm of stomach, unspecified: Secondary | ICD-10-CM

## 2012-08-11 MED ORDER — SODIUM CHLORIDE 0.9 % IJ SOLN
10.0000 mL | INTRAMUSCULAR | Status: DC | PRN
  Administered 2012-08-11: 10 mL
  Filled 2012-08-11: qty 10

## 2012-08-11 MED ORDER — HEPARIN SOD (PORK) LOCK FLUSH 100 UNIT/ML IV SOLN
500.0000 [IU] | Freq: Once | INTRAVENOUS | Status: AC | PRN
  Administered 2012-08-11: 500 [IU]
  Filled 2012-08-11: qty 5

## 2012-08-11 NOTE — Progress Notes (Signed)
100%ind 08/11/12-02/11/13. I will send the letter and green card to the patient.

## 2012-08-20 ENCOUNTER — Other Ambulatory Visit: Payer: Self-pay | Admitting: Oncology

## 2012-08-22 ENCOUNTER — Other Ambulatory Visit: Payer: Self-pay | Admitting: *Deleted

## 2012-08-22 DIAGNOSIS — C169 Malignant neoplasm of stomach, unspecified: Secondary | ICD-10-CM

## 2012-08-22 MED ORDER — HYDROCODONE-ACETAMINOPHEN 5-325 MG PO TABS: 1.0000 | ORAL_TABLET | Freq: Four times a day (QID) | ORAL | Status: DC | PRN

## 2012-08-22 NOTE — Telephone Encounter (Signed)
Wife called to request refill on pain med-only has #2 pills left. Suggested in future she provide 24 hour notice for refills. Also reviewed his appointment times for tomorrow with her.

## 2012-08-23 ENCOUNTER — Ambulatory Visit (HOSPITAL_BASED_OUTPATIENT_CLINIC_OR_DEPARTMENT_OTHER): Payer: BC Managed Care – PPO

## 2012-08-23 ENCOUNTER — Other Ambulatory Visit (HOSPITAL_BASED_OUTPATIENT_CLINIC_OR_DEPARTMENT_OTHER): Payer: BC Managed Care – PPO | Admitting: Lab

## 2012-08-23 ENCOUNTER — Telehealth: Payer: Self-pay | Admitting: Oncology

## 2012-08-23 ENCOUNTER — Telehealth: Payer: Self-pay | Admitting: *Deleted

## 2012-08-23 ENCOUNTER — Ambulatory Visit (HOSPITAL_BASED_OUTPATIENT_CLINIC_OR_DEPARTMENT_OTHER): Payer: BC Managed Care – PPO | Admitting: Oncology

## 2012-08-23 ENCOUNTER — Ambulatory Visit: Payer: BC Managed Care – PPO | Admitting: Nutrition

## 2012-08-23 VITALS — BP 105/73 | HR 77 | Temp 98.6°F | Resp 18 | Ht 63.0 in | Wt 116.8 lb

## 2012-08-23 DIAGNOSIS — Z5111 Encounter for antineoplastic chemotherapy: Secondary | ICD-10-CM

## 2012-08-23 DIAGNOSIS — C169 Malignant neoplasm of stomach, unspecified: Secondary | ICD-10-CM

## 2012-08-23 DIAGNOSIS — Z800 Family history of malignant neoplasm of digestive organs: Secondary | ICD-10-CM

## 2012-08-23 DIAGNOSIS — C778 Secondary and unspecified malignant neoplasm of lymph nodes of multiple regions: Secondary | ICD-10-CM

## 2012-08-23 DIAGNOSIS — K5900 Constipation, unspecified: Secondary | ICD-10-CM

## 2012-08-23 LAB — CBC WITH DIFFERENTIAL/PLATELET
BASO%: 1.3 % (ref 0.0–2.0)
Eosinophils Absolute: 0.1 10*3/uL (ref 0.0–0.5)
HCT: 32.2 % — ABNORMAL LOW (ref 38.4–49.9)
LYMPH%: 29.7 % (ref 14.0–49.0)
MCHC: 33.9 g/dL (ref 32.0–36.0)
MCV: 83.8 fL (ref 79.3–98.0)
MONO#: 0.5 10*3/uL (ref 0.1–0.9)
MONO%: 12.1 % (ref 0.0–14.0)
NEUT%: 55.3 % (ref 39.0–75.0)
Platelets: 227 10*3/uL (ref 140–400)
RBC: 3.84 10*6/uL — ABNORMAL LOW (ref 4.20–5.82)
WBC: 3.9 10*3/uL — ABNORMAL LOW (ref 4.0–10.3)

## 2012-08-23 LAB — COMPREHENSIVE METABOLIC PANEL (CC13)
Alkaline Phosphatase: 54 U/L (ref 40–150)
CO2: 29 mEq/L (ref 22–29)
Creatinine: 0.7 mg/dL (ref 0.7–1.3)
Glucose: 119 mg/dl — ABNORMAL HIGH (ref 70–99)
Total Bilirubin: 0.26 mg/dL (ref 0.20–1.20)

## 2012-08-23 MED ORDER — DEXTROSE 5 % IV SOLN
Freq: Once | INTRAVENOUS | Status: AC
  Administered 2012-08-23: 13:00:00 via INTRAVENOUS

## 2012-08-23 MED ORDER — PALONOSETRON HCL INJECTION 0.25 MG/5ML
0.2500 mg | Freq: Once | INTRAVENOUS | Status: AC
  Administered 2012-08-23: 0.25 mg via INTRAVENOUS

## 2012-08-23 MED ORDER — DEXAMETHASONE SODIUM PHOSPHATE 10 MG/ML IJ SOLN
10.0000 mg | Freq: Once | INTRAMUSCULAR | Status: AC
  Administered 2012-08-23: 10 mg via INTRAVENOUS

## 2012-08-23 MED ORDER — LEUCOVORIN CALCIUM INJECTION 350 MG
400.0000 mg/m2 | Freq: Once | INTRAVENOUS | Status: AC
  Administered 2012-08-23: 624 mg via INTRAVENOUS
  Filled 2012-08-23: qty 31.2

## 2012-08-23 MED ORDER — OXALIPLATIN CHEMO INJECTION 100 MG/20ML
85.0000 mg/m2 | Freq: Once | INTRAVENOUS | Status: AC
  Administered 2012-08-23: 135 mg via INTRAVENOUS
  Filled 2012-08-23: qty 27

## 2012-08-23 MED ORDER — SODIUM CHLORIDE 0.9 % IV SOLN
2400.0000 mg/m2 | INTRAVENOUS | Status: DC
  Administered 2012-08-23: 3750 mg via INTRAVENOUS
  Filled 2012-08-23: qty 75

## 2012-08-23 MED ORDER — FLUOROURACIL CHEMO INJECTION 2.5 GM/50ML
400.0000 mg/m2 | Freq: Once | INTRAVENOUS | Status: AC
  Administered 2012-08-23: 600 mg via INTRAVENOUS
  Filled 2012-08-23: qty 12

## 2012-08-23 NOTE — Telephone Encounter (Signed)
lvm for pt regarding to time change of 6.16.14 appt .Marland KitchenMarland KitchenMarland Kitchen

## 2012-08-23 NOTE — Progress Notes (Signed)
I spoke with patient briefly today in chemotherapy. Patient has lost weight since our last visit. His weight was documented as 116.8 pounds June 4 down from 124 pounds May 21. Per patient and wife he is eating much better the past 4 days. He no longer tolerates milk or milk products and does not want any more ensure. He has been trying to increase his intake. Patient denies other side effects.  Nutrition diagnosis: Unintended weight loss continues.  Intervention: I have educated patient to continue higher calorie, higher protein foods to promote weight maintenance. I provided support and encouragement to patient and wife.  Monitoring, evaluation, goals: Patient will tolerate increased calories and protein to maintain lean body mass to minimize weight loss.  Next visit: Patient does not have chemotherapy scheduled at this point therefore no followup is made. Patient and wife have my contact information for questions or concerns.

## 2012-08-23 NOTE — Telephone Encounter (Signed)
Per staff message and POF I have scheduled appts.  JMW  

## 2012-08-23 NOTE — Progress Notes (Signed)
    Cancer Center    OFFICE PROGRESS NOTE   INTERVAL HISTORY:   He returns as scheduled. He completed a first cycle of FOLFOX on 08/09/2012. No cold sensitivity or neuropathy symptoms. He reports nausea for several days after chemotherapy. No emesis. The abdominal pain has improved. He is no longer taking MS Contin. He is not using the Reglan. His appetite is much improved over the past 4 days. He does not wish to have tooth extractions at present. He has difficulty with milk based products.  Objective:  Vital signs in last 24 hours:  Blood pressure 105/73, pulse 77, temperature 98.6 F (37 C), temperature source Oral, resp. rate 18, height 5\' 3"  (1.6 m), weight 116 lb 12.8 oz (52.98 kg).    HEENT: No thrush or ulcer Resp: Lungs clear bilaterally Cardio: Regular rate and rhythm GI: No hepatomegaly, nontender Vascular: No leg edema    Portacath/PICC-without erythema  Lab Results:  Lab Results  Component Value Date   WBC 3.9* 08/23/2012   HGB 10.9* 08/23/2012   HCT 32.2* 08/23/2012   MCV 83.8 08/23/2012   PLT 227 08/23/2012   ANC 2.2    Medications: I have reviewed the patient's current medications.  Assessment/Plan: 1.MetastaticGastric cancer-status post biopsy of a gastric mass on 07/20/2012 with the pathology confirming adenocarcinoma , HER-2 non-amplified  - CT 07/18/2012 consistent with a primary gastric mass and metastatic omental/peritoneal nodules  -biopsy of a peritoneal nodule 07/31/2012 consistent with metastatic adenocarcinoma  -Cycle 1 of FOLFOX on 08/02/2012 3. Multiple colon polyps documented on a colonoscopy 06/22/2012-tubular adenomas, tubulovillous adenomas, and hyperplastic polyps  4. Family history of colon cancer (mother)  5. 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 Kristin Bruins, multiple extractions are recommended 7. Anorexia,  nausea, and early satiety-improved over the past 4 days 8. Constipation secondary to narcotics and carcinomatosis-he will continue MiraLAX and Senokot  Disposition:  He tolerated the first cycle of FOLFOX without significant acute toxicity. The plan is to proceed with cycle 2 today. His performance status has improved over the past several days. The plan is to complete 3-4 cycles of chemotherapy prior to considering tooth extractions. He reports improvement in the tooth discomfort since starting peridex. Mr. Lewelling will return for an office visit and chemotherapy in 2 weeks.   Thornton Papas, MD  08/23/2012  12:28 PM

## 2012-08-23 NOTE — Patient Instructions (Addendum)
[facility] Discharge Instructions for Patients Receiving Chemotherapy  Today you received the following chemotherapy agents  Oxaliplatin, leucovorin and 75fu.  To help prevent nausea and vomiting after your treatment, we encourage you to take your nausea medication compazine.   If you develop nausea and vomiting that is not controlled by your nausea medication, call the clinic.   BELOW ARE SYMPTOMS THAT SHOULD BE REPORTED IMMEDIATELY:  *FEVER GREATER THAN 100.5 F  *CHILLS WITH OR WITHOUT FEVER  NAUSEA AND VOMITING THAT IS NOT CONTROLLED WITH YOUR NAUSEA MEDICATION  *UNUSUAL SHORTNESS OF BREATH  *UNUSUAL BRUISING OR BLEEDING  TENDERNESS IN MOUTH AND THROAT WITH OR WITHOUT PRESENCE OF ULCERS  *URINARY PROBLEMS  *BOWEL PROBLEMS  UNUSUAL RASH Items with * indicate a potential emergency and should be followed up as soon as possible.  Feel free to call the clinic you have any questions or concerns. The clinic phone number is [phone].

## 2012-08-23 NOTE — Progress Notes (Signed)
Met with patient and wife (interpreter present) to assess for needs.  The patient stated he is feeling much better. He has been able to increase food intake over the last week, giving him more strength.  His wife stated they are currently working with services to assist with getting air conditioning.  The patient's wife denied having any problems with transportation or obtaining medications.  The patient and his wife deny need for assist at present time and acknowledged they would call if needed.  This RN will continue to follow for barriers to care.

## 2012-08-25 ENCOUNTER — Ambulatory Visit (HOSPITAL_BASED_OUTPATIENT_CLINIC_OR_DEPARTMENT_OTHER): Payer: BC Managed Care – PPO

## 2012-08-25 VITALS — BP 114/74 | HR 70 | Temp 98.3°F | Resp 20

## 2012-08-25 DIAGNOSIS — Z452 Encounter for adjustment and management of vascular access device: Secondary | ICD-10-CM

## 2012-08-25 DIAGNOSIS — C778 Secondary and unspecified malignant neoplasm of lymph nodes of multiple regions: Secondary | ICD-10-CM

## 2012-08-25 DIAGNOSIS — C169 Malignant neoplasm of stomach, unspecified: Secondary | ICD-10-CM

## 2012-08-25 MED ORDER — SODIUM CHLORIDE 0.9 % IJ SOLN
10.0000 mL | INTRAMUSCULAR | Status: DC | PRN
  Administered 2012-08-25: 10 mL
  Filled 2012-08-25: qty 10

## 2012-08-25 MED ORDER — HEPARIN SOD (PORK) LOCK FLUSH 100 UNIT/ML IV SOLN
500.0000 [IU] | Freq: Once | INTRAVENOUS | Status: AC | PRN
  Administered 2012-08-25: 500 [IU]
  Filled 2012-08-25: qty 5

## 2012-08-28 ENCOUNTER — Encounter: Payer: Self-pay | Admitting: Oncology

## 2012-08-28 NOTE — Progress Notes (Signed)
I called and spoke with the wife Joseph Salinas. I advised her we can only pay 200.00 to [employer]. The difference has to stay for his meds. She was upset, because she is trying to get some more insurance for them, and it is high and she said they don't have the money to pay since he is not working.

## 2012-08-28 NOTE — Progress Notes (Signed)
Called and left the patient and wife a message to call me back. We can pay 200.00 toward the [employer] conditioning bill they bought in for assistance. The remaining is for his meds.

## 2012-08-30 ENCOUNTER — Other Ambulatory Visit: Payer: Self-pay | Admitting: Oncology

## 2012-09-02 ENCOUNTER — Other Ambulatory Visit: Payer: Self-pay | Admitting: Oncology

## 2012-09-06 ENCOUNTER — Ambulatory Visit (HOSPITAL_BASED_OUTPATIENT_CLINIC_OR_DEPARTMENT_OTHER): Payer: BC Managed Care – PPO | Admitting: Oncology

## 2012-09-06 ENCOUNTER — Ambulatory Visit (HOSPITAL_BASED_OUTPATIENT_CLINIC_OR_DEPARTMENT_OTHER): Payer: BC Managed Care – PPO

## 2012-09-06 ENCOUNTER — Other Ambulatory Visit (HOSPITAL_BASED_OUTPATIENT_CLINIC_OR_DEPARTMENT_OTHER): Payer: BC Managed Care – PPO | Admitting: Lab

## 2012-09-06 ENCOUNTER — Encounter: Payer: Self-pay | Admitting: Oncology

## 2012-09-06 ENCOUNTER — Telehealth: Payer: Self-pay | Admitting: Oncology

## 2012-09-06 VITALS — BP 99/66 | HR 91 | Temp 98.4°F | Resp 18 | Ht 63.0 in | Wt 116.2 lb

## 2012-09-06 DIAGNOSIS — C778 Secondary and unspecified malignant neoplasm of lymph nodes of multiple regions: Secondary | ICD-10-CM

## 2012-09-06 DIAGNOSIS — Z800 Family history of malignant neoplasm of digestive organs: Secondary | ICD-10-CM

## 2012-09-06 DIAGNOSIS — Z5111 Encounter for antineoplastic chemotherapy: Secondary | ICD-10-CM

## 2012-09-06 DIAGNOSIS — C169 Malignant neoplasm of stomach, unspecified: Secondary | ICD-10-CM

## 2012-09-06 DIAGNOSIS — K5900 Constipation, unspecified: Secondary | ICD-10-CM

## 2012-09-06 LAB — CBC WITH DIFFERENTIAL/PLATELET
BASO%: 1.2 % (ref 0.0–2.0)
Basophils Absolute: 0.1 10*3/uL (ref 0.0–0.1)
EOS%: 1.9 % (ref 0.0–7.0)
HGB: 11.5 g/dL — ABNORMAL LOW (ref 13.0–17.1)
MCH: 26.6 pg — ABNORMAL LOW (ref 27.2–33.4)
MCHC: 32.2 g/dL (ref 32.0–36.0)
MONO#: 0.8 10*3/uL (ref 0.1–0.9)
RDW: 14.3 % (ref 11.0–14.6)
WBC: 4.3 10*3/uL (ref 4.0–10.3)
lymph#: 1.6 10*3/uL (ref 0.9–3.3)

## 2012-09-06 LAB — COMPREHENSIVE METABOLIC PANEL (CC13)
AST: 8 U/L (ref 5–34)
Albumin: 3.6 g/dL (ref 3.5–5.0)
Alkaline Phosphatase: 71 U/L (ref 40–150)
Chloride: 103 mEq/L (ref 98–107)
Potassium: 3.7 mEq/L (ref 3.5–5.1)
Sodium: 139 mEq/L (ref 136–145)
Total Protein: 6.8 g/dL (ref 6.4–8.3)

## 2012-09-06 MED ORDER — LEUCOVORIN CALCIUM INJECTION 350 MG
400.0000 mg/m2 | Freq: Once | INTRAVENOUS | Status: AC
  Administered 2012-09-06: 624 mg via INTRAVENOUS
  Filled 2012-09-06: qty 31.2

## 2012-09-06 MED ORDER — DEXTROSE 5 % IV SOLN
Freq: Once | INTRAVENOUS | Status: AC
  Administered 2012-09-06: 13:00:00 via INTRAVENOUS

## 2012-09-06 MED ORDER — FLUOROURACIL CHEMO INJECTION 2.5 GM/50ML
400.0000 mg/m2 | Freq: Once | INTRAVENOUS | Status: AC
  Administered 2012-09-06: 600 mg via INTRAVENOUS
  Filled 2012-09-06: qty 12

## 2012-09-06 MED ORDER — PALONOSETRON HCL INJECTION 0.25 MG/5ML
0.2500 mg | Freq: Once | INTRAVENOUS | Status: AC
  Administered 2012-09-06: 0.25 mg via INTRAVENOUS

## 2012-09-06 MED ORDER — SODIUM CHLORIDE 0.9 % IV SOLN
2400.0000 mg/m2 | INTRAVENOUS | Status: DC
  Administered 2012-09-06: 3750 mg via INTRAVENOUS
  Filled 2012-09-06: qty 75

## 2012-09-06 MED ORDER — OXALIPLATIN CHEMO INJECTION 100 MG/20ML
85.0000 mg/m2 | Freq: Once | INTRAVENOUS | Status: AC
  Administered 2012-09-06: 135 mg via INTRAVENOUS
  Filled 2012-09-06: qty 27

## 2012-09-06 MED ORDER — DEXAMETHASONE SODIUM PHOSPHATE 10 MG/ML IJ SOLN
10.0000 mg | Freq: Once | INTRAMUSCULAR | Status: AC
  Administered 2012-09-06: 10 mg via INTRAVENOUS

## 2012-09-06 NOTE — Patient Instructions (Addendum)
[facility] Discharge Instructions for Patients Receiving Chemotherapy  Today you received the following chemotherapy agents Oxaliplatin/Leucovorin/5FU  To help prevent nausea and vomiting after your treatment, we encourage you to take your nausea medication as prescribed.   If you develop nausea and vomiting that is not controlled by your nausea medication, call the clinic.   BELOW ARE SYMPTOMS THAT SHOULD BE REPORTED IMMEDIATELY:  *FEVER GREATER THAN 100.5 F  *CHILLS WITH OR WITHOUT FEVER  NAUSEA AND VOMITING THAT IS NOT CONTROLLED WITH YOUR NAUSEA MEDICATION  *UNUSUAL SHORTNESS OF BREATH  *UNUSUAL BRUISING OR BLEEDING  TENDERNESS IN MOUTH AND THROAT WITH OR WITHOUT PRESENCE OF ULCERS  *URINARY PROBLEMS  *BOWEL PROBLEMS  UNUSUAL RASH Items with * indicate a potential emergency and should be followed up as soon as possible.  Feel free to call the clinic you have any questions or concerns. The clinic phone number is [phone].    

## 2012-09-06 NOTE — Telephone Encounter (Signed)
Gave pt appt for labs, md and chemo for June and July 2014

## 2012-09-06 NOTE — Progress Notes (Signed)
   [facility]    OFFICE PROGRESS NOTE   INTERVAL HISTORY:   He returns as scheduled. He completed a second cycle of FOLFOX on 08/23/2012. No nausea/vomiting, mouth sores, or diarrhea following chemotherapy. No neuropathy symptoms. He reports an improved appetite. Nausea and abdominal pain have improved. He now feels well.  Objective:  Vital signs in last 24 hours:  Blood pressure 99/66, pulse 91, temperature 98.4 F (36.9 C), temperature source Oral, resp. rate 18, height 5\' 3"  (1.6 m), weight 116 lb 3.2 oz (52.708 kg).    HEENT: no thrush or ulcers Resp: lungs clear bilaterally Cardio: regular rate and rhythm GI: no hepatomegaly, no mass, mild tenderness in the midabdomen near the umbilicus Vascular: no leg edema  Skin:mild hyperpigmentation at the hands   Portacath/PICC-without erythema  Lab Results:  Lab Results  Component Value Date   WBC 4.3 09/06/2012   HGB 11.5* 09/06/2012   HCT 35.7* 09/06/2012   MCV 82.4 09/06/2012   PLT 197 09/06/2012  ANC 1.8    Medications: I have reviewed the patient's current medications.  Assessment/Plan: 1.MetastaticGastric cancer-status post biopsy of a gastric mass on 07/20/2012 with the pathology confirming adenocarcinoma , HER-2 non-amplified  - CT 07/18/2012 consistent with a primary gastric mass and metastatic omental/peritoneal nodules  -biopsy of a peritoneal nodule 07/31/2012 consistent with metastatic adenocarcinoma  -Cycle 1 of FOLFOX on 08/02/2012  3. Multiple colon polyps documented on a colonoscopy 06/22/2012-tubular adenomas, tubulovillous adenomas, and hyperplastic polyps  4. Family history of colon cancer (mother)  5. 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 Kristin Bruins, multiple extractions are recommended  7. Anorexia, nausea, and early satiety-improved over the past 4 days  8.  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 Prieur has completed 2 treatments with FOLFOX. He has tolerated the chemotherapy well and his performance status has improved. The plan is to proceed with cycle 3 today. He will return for an office visit and chemotherapy in 2 weeks.  We will check a CEA when he returns in 2 weeks. We will plan for a restaging CT after 5 or 6 treatments with FOLFOX.   Thornton Papas, MD  09/06/2012  5:34 PM

## 2012-09-06 NOTE — Progress Notes (Signed)
The patient's wife called and wanted the [facility] bill to be paid. They have 200.00 that can go toward the bill. She spoke with Britt Boozer the manager and wanted me to call her at [phone]. I called and had to leave a message for her to call me back. I need to confirm they are going to give her 200.00 back once they receive our 200.00.

## 2012-09-08 ENCOUNTER — Ambulatory Visit (HOSPITAL_BASED_OUTPATIENT_CLINIC_OR_DEPARTMENT_OTHER): Payer: BC Managed Care – PPO

## 2012-09-08 VITALS — BP 106/75 | HR 88 | Temp 99.0°F | Resp 20

## 2012-09-08 DIAGNOSIS — C169 Malignant neoplasm of stomach, unspecified: Secondary | ICD-10-CM

## 2012-09-08 DIAGNOSIS — C778 Secondary and unspecified malignant neoplasm of lymph nodes of multiple regions: Secondary | ICD-10-CM

## 2012-09-08 DIAGNOSIS — Z452 Encounter for adjustment and management of vascular access device: Secondary | ICD-10-CM

## 2012-09-08 MED ORDER — SODIUM CHLORIDE 0.9 % IJ SOLN
10.0000 mL | INTRAMUSCULAR | Status: DC | PRN
  Administered 2012-09-08: 10 mL
  Filled 2012-09-08: qty 10

## 2012-09-08 MED ORDER — HEPARIN SOD (PORK) LOCK FLUSH 100 UNIT/ML IV SOLN
500.0000 [IU] | Freq: Once | INTRAVENOUS | Status: AC | PRN
  Administered 2012-09-08: 500 [IU]
  Filled 2012-09-08: qty 5

## 2012-09-08 NOTE — Patient Instructions (Signed)
Call MD for problems or concerns 

## 2012-09-17 ENCOUNTER — Other Ambulatory Visit: Payer: Self-pay | Admitting: Oncology

## 2012-09-19 ENCOUNTER — Ambulatory Visit (HOSPITAL_BASED_OUTPATIENT_CLINIC_OR_DEPARTMENT_OTHER): Payer: BC Managed Care – PPO | Admitting: Oncology

## 2012-09-19 ENCOUNTER — Telehealth: Payer: Self-pay | Admitting: Oncology

## 2012-09-19 ENCOUNTER — Telehealth: Payer: Self-pay | Admitting: *Deleted

## 2012-09-19 ENCOUNTER — Ambulatory Visit (HOSPITAL_BASED_OUTPATIENT_CLINIC_OR_DEPARTMENT_OTHER): Payer: BC Managed Care – PPO

## 2012-09-19 ENCOUNTER — Other Ambulatory Visit (HOSPITAL_BASED_OUTPATIENT_CLINIC_OR_DEPARTMENT_OTHER): Payer: BC Managed Care – PPO | Admitting: Lab

## 2012-09-19 VITALS — BP 97/59 | HR 68 | Temp 98.8°F | Resp 20 | Ht 63.0 in | Wt 118.8 lb

## 2012-09-19 DIAGNOSIS — C169 Malignant neoplasm of stomach, unspecified: Secondary | ICD-10-CM

## 2012-09-19 DIAGNOSIS — R110 Nausea: Secondary | ICD-10-CM

## 2012-09-19 DIAGNOSIS — Z800 Family history of malignant neoplasm of digestive organs: Secondary | ICD-10-CM

## 2012-09-19 DIAGNOSIS — Z5111 Encounter for antineoplastic chemotherapy: Secondary | ICD-10-CM

## 2012-09-19 DIAGNOSIS — C786 Secondary malignant neoplasm of retroperitoneum and peritoneum: Secondary | ICD-10-CM

## 2012-09-19 LAB — COMPREHENSIVE METABOLIC PANEL (CC13)
CO2: 28 mEq/L (ref 22–29)
Calcium: 9 mg/dL (ref 8.4–10.4)
Chloride: 107 mEq/L (ref 98–109)
Creatinine: 0.7 mg/dL (ref 0.7–1.3)
Glucose: 102 mg/dl (ref 70–140)
Sodium: 143 mEq/L (ref 136–145)
Total Bilirubin: 0.25 mg/dL (ref 0.20–1.20)
Total Protein: 6.9 g/dL (ref 6.4–8.3)

## 2012-09-19 LAB — CBC WITH DIFFERENTIAL/PLATELET
Basophils Absolute: 0.1 10*3/uL (ref 0.0–0.1)
EOS%: 1.7 % (ref 0.0–7.0)
HCT: 34.4 % — ABNORMAL LOW (ref 38.4–49.9)
HGB: 11.1 g/dL — ABNORMAL LOW (ref 13.0–17.1)
MCH: 26.6 pg — ABNORMAL LOW (ref 27.2–33.4)
MCV: 82.5 fL (ref 79.3–98.0)
MONO%: 16.1 % — ABNORMAL HIGH (ref 0.0–14.0)
NEUT%: 44.3 % (ref 39.0–75.0)

## 2012-09-19 MED ORDER — DEXTROSE 5 % IV SOLN
Freq: Once | INTRAVENOUS | Status: AC
  Administered 2012-09-19: 12:00:00 via INTRAVENOUS

## 2012-09-19 MED ORDER — SODIUM CHLORIDE 0.9 % IV SOLN
2400.0000 mg/m2 | INTRAVENOUS | Status: DC
  Administered 2012-09-19: 3750 mg via INTRAVENOUS
  Filled 2012-09-19: qty 75

## 2012-09-19 MED ORDER — DEXAMETHASONE SODIUM PHOSPHATE 10 MG/ML IJ SOLN
10.0000 mg | Freq: Once | INTRAMUSCULAR | Status: AC
  Administered 2012-09-19: 10 mg via INTRAVENOUS

## 2012-09-19 MED ORDER — FLUOROURACIL CHEMO INJECTION 2.5 GM/50ML
400.0000 mg/m2 | Freq: Once | INTRAVENOUS | Status: AC
  Administered 2012-09-19: 600 mg via INTRAVENOUS
  Filled 2012-09-19: qty 12

## 2012-09-19 MED ORDER — OXALIPLATIN CHEMO INJECTION 100 MG/20ML
85.0000 mg/m2 | Freq: Once | INTRAVENOUS | Status: AC
  Administered 2012-09-19: 135 mg via INTRAVENOUS
  Filled 2012-09-19: qty 27

## 2012-09-19 MED ORDER — PALONOSETRON HCL INJECTION 0.25 MG/5ML
0.2500 mg | Freq: Once | INTRAVENOUS | Status: AC
  Administered 2012-09-19: 0.25 mg via INTRAVENOUS

## 2012-09-19 MED ORDER — LEUCOVORIN CALCIUM INJECTION 350 MG
385.0000 mg/m2 | Freq: Once | INTRAVENOUS | Status: AC
  Administered 2012-09-19: 600 mg via INTRAVENOUS
  Filled 2012-09-19: qty 30

## 2012-09-19 NOTE — Patient Instructions (Addendum)
[facility] Discharge Instructions for Patients Receiving Chemotherapy  Today you received the following chemotherapy agents Oxaliplatin/Leucovorin/5FU  To help prevent nausea and vomiting after your treatment, we encourage you to take your nausea medication as prescribed.   If you develop nausea and vomiting that is not controlled by your nausea medication, call the clinic.   BELOW ARE SYMPTOMS THAT SHOULD BE REPORTED IMMEDIATELY:  *FEVER GREATER THAN 100.5 F  *CHILLS WITH OR WITHOUT FEVER  NAUSEA AND VOMITING THAT IS NOT CONTROLLED WITH YOUR NAUSEA MEDICATION  *UNUSUAL SHORTNESS OF BREATH  *UNUSUAL BRUISING OR BLEEDING  TENDERNESS IN MOUTH AND THROAT WITH OR WITHOUT PRESENCE OF ULCERS  *URINARY PROBLEMS  *BOWEL PROBLEMS  UNUSUAL RASH Items with * indicate a potential emergency and should be followed up as soon as possible.  Feel free to call the clinic you have any questions or concerns. The clinic phone number is [phone].    

## 2012-09-19 NOTE — Progress Notes (Signed)
   [facility]    OFFICE PROGRESS NOTE   INTERVAL HISTORY:   He returns as scheduled. He completed another cycle of FOLFOX on 09/06/2012. He feels well. Good appetite. No nausea or pain. No mouth sores or diarrhea.  Objective:  Vital signs in last 24 hours:  Blood pressure 97/59, pulse 68, temperature 98.8 F (37.1 C), temperature source Oral, resp. rate 20, height 5\' 3"  (1.6 m), weight 118 lb 12.8 oz (53.887 kg).    HEENT: No thrush or ulcers Resp: Lungs clear bilaterally Cardio: Regular rate and rhythm GI: No hepatomegaly, no mass, nontender Vascular: No leg edema Neuro: The vibratory sense is intact at the fingertips bilaterally    Portacath/PICC-without erythema  Lab Results:  Lab Results  Component Value Date   WBC 4.7 09/19/2012   HGB 11.1* 09/19/2012   HCT 34.4* 09/19/2012   MCV 82.5 09/19/2012   PLT 148 09/19/2012   ANC 2.1    Medications: I have reviewed the patient's current medications.  Assessment/Plan: 1.MetastaticGastric cancer-status post biopsy of a gastric mass on 07/20/2012 with the pathology confirming adenocarcinoma , HER-2 non-amplified  - CT 07/18/2012 consistent with a primary gastric mass and metastatic omental/peritoneal nodules  -biopsy of a peritoneal nodule 07/31/2012 consistent with metastatic adenocarcinoma  -Cycle 1 of FOLFOX on 08/02/2012  3. Multiple colon polyps documented on a colonoscopy 06/22/2012-tubular adenomas, tubulovillous adenomas, and hyperplastic polyps  4. Family history of colon cancer (mother)  5. 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 Kristin Bruins, multiple extractions are recommended  7. Anorexia, nausea, and early satiety-improved  8. Constipation secondary to narcotics and carcinomatosis-improved   Disposition:  Mr. Griffing has completed 3 treatments with FOLFOX. There has been  significant clinical improvement. He is tolerating the chemotherapy well. The plan is to proceed with cycle 4 today. We obtained a CEA today. He will return for an office visit and chemotherapy in 2 weeks.  He will be referred for a restaging CT after cycle 5.   Thornton Papas, MD  09/19/2012  11:10 AM

## 2012-09-19 NOTE — Telephone Encounter (Signed)
Per staff phone call and POF I have schedueld appts.  JMW  

## 2012-09-19 NOTE — Telephone Encounter (Signed)
Gave pt appt for lab, chemo and MD on July, and August 2014 gave oral contrast for CT and printed AVS

## 2012-09-21 ENCOUNTER — Ambulatory Visit (HOSPITAL_BASED_OUTPATIENT_CLINIC_OR_DEPARTMENT_OTHER): Payer: BC Managed Care – PPO

## 2012-09-21 ENCOUNTER — Telehealth: Payer: Self-pay | Admitting: *Deleted

## 2012-09-21 VITALS — BP 99/66 | HR 67 | Temp 98.5°F | Resp 18

## 2012-09-21 DIAGNOSIS — C169 Malignant neoplasm of stomach, unspecified: Secondary | ICD-10-CM

## 2012-09-21 MED ORDER — HEPARIN SOD (PORK) LOCK FLUSH 100 UNIT/ML IV SOLN
500.0000 [IU] | Freq: Once | INTRAVENOUS | Status: AC | PRN
  Administered 2012-09-21: 500 [IU]
  Filled 2012-09-21: qty 5

## 2012-09-21 MED ORDER — SODIUM CHLORIDE 0.9 % IJ SOLN
10.0000 mL | INTRAMUSCULAR | Status: DC | PRN
Start: 2012-09-21 — End: 2012-09-21
  Administered 2012-09-21: 10 mL
  Filled 2012-09-21: qty 10

## 2012-09-21 NOTE — Telephone Encounter (Signed)
Called pt's wife with CEA results. She asked appropriate questions and voiced understanding.

## 2012-09-21 NOTE — Telephone Encounter (Signed)
Message copied by Caleb Popp on Thu Sep 21, 2012  8:33 AM ------      Message from: Thornton Papas B      Created: Wed Sep 20, 2012  9:08 PM       Please call wife, CEA is better, f/u as scheduled ------

## 2012-09-28 ENCOUNTER — Encounter: Payer: Self-pay | Admitting: *Deleted

## 2012-10-01 ENCOUNTER — Other Ambulatory Visit: Payer: Self-pay | Admitting: Oncology

## 2012-10-04 ENCOUNTER — Ambulatory Visit: Payer: BC Managed Care – PPO | Admitting: Nutrition

## 2012-10-04 ENCOUNTER — Telehealth: Payer: Self-pay | Admitting: Oncology

## 2012-10-04 ENCOUNTER — Ambulatory Visit (HOSPITAL_BASED_OUTPATIENT_CLINIC_OR_DEPARTMENT_OTHER): Payer: BC Managed Care – PPO | Admitting: Oncology

## 2012-10-04 ENCOUNTER — Other Ambulatory Visit (HOSPITAL_BASED_OUTPATIENT_CLINIC_OR_DEPARTMENT_OTHER): Payer: BC Managed Care – PPO | Admitting: Lab

## 2012-10-04 ENCOUNTER — Ambulatory Visit (HOSPITAL_BASED_OUTPATIENT_CLINIC_OR_DEPARTMENT_OTHER): Payer: BC Managed Care – PPO

## 2012-10-04 ENCOUNTER — Telehealth: Payer: Self-pay | Admitting: *Deleted

## 2012-10-04 VITALS — BP 114/74 | HR 74 | Temp 97.6°F | Resp 20 | Ht 63.0 in | Wt 124.6 lb

## 2012-10-04 DIAGNOSIS — Z800 Family history of malignant neoplasm of digestive organs: Secondary | ICD-10-CM

## 2012-10-04 DIAGNOSIS — Z5111 Encounter for antineoplastic chemotherapy: Secondary | ICD-10-CM

## 2012-10-04 DIAGNOSIS — C786 Secondary malignant neoplasm of retroperitoneum and peritoneum: Secondary | ICD-10-CM

## 2012-10-04 DIAGNOSIS — C169 Malignant neoplasm of stomach, unspecified: Secondary | ICD-10-CM

## 2012-10-04 LAB — COMPREHENSIVE METABOLIC PANEL (CC13)
BUN: 9.7 mg/dL (ref 7.0–26.0)
CO2: 27 mEq/L (ref 22–29)
Calcium: 9.1 mg/dL (ref 8.4–10.4)
Chloride: 105 mEq/L (ref 98–109)
Creatinine: 0.7 mg/dL (ref 0.7–1.3)

## 2012-10-04 LAB — CBC WITH DIFFERENTIAL/PLATELET
BASO%: 1 % (ref 0.0–2.0)
Basophils Absolute: 0 10*3/uL (ref 0.0–0.1)
HCT: 34.4 % — ABNORMAL LOW (ref 38.4–49.9)
HGB: 11.1 g/dL — ABNORMAL LOW (ref 13.0–17.1)
LYMPH%: 37.8 % (ref 14.0–49.0)
MCHC: 32.3 g/dL (ref 32.0–36.0)
MONO#: 0.5 10*3/uL (ref 0.1–0.9)
NEUT%: 45.1 % (ref 39.0–75.0)
Platelets: 144 10*3/uL (ref 140–400)
WBC: 4 10*3/uL (ref 4.0–10.3)
lymph#: 1.5 10*3/uL (ref 0.9–3.3)

## 2012-10-04 MED ORDER — LEUCOVORIN CALCIUM INJECTION 350 MG
385.0000 mg/m2 | Freq: Once | INTRAVENOUS | Status: AC
  Administered 2012-10-04: 600 mg via INTRAVENOUS
  Filled 2012-10-04: qty 30

## 2012-10-04 MED ORDER — SODIUM CHLORIDE 0.9 % IV SOLN
2400.0000 mg/m2 | INTRAVENOUS | Status: DC
  Administered 2012-10-04: 3750 mg via INTRAVENOUS
  Filled 2012-10-04: qty 75

## 2012-10-04 MED ORDER — DEXAMETHASONE SODIUM PHOSPHATE 10 MG/ML IJ SOLN
10.0000 mg | Freq: Once | INTRAMUSCULAR | Status: AC
  Administered 2012-10-04: 10 mg via INTRAVENOUS

## 2012-10-04 MED ORDER — PALONOSETRON HCL INJECTION 0.25 MG/5ML
0.2500 mg | Freq: Once | INTRAVENOUS | Status: AC
  Administered 2012-10-04: 0.25 mg via INTRAVENOUS

## 2012-10-04 MED ORDER — DEXAMETHASONE 4 MG PO TABS: 4.0000 mg | ORAL_TABLET | Freq: Two times a day (BID) | ORAL | Status: DC

## 2012-10-04 MED ORDER — OXALIPLATIN CHEMO INJECTION 100 MG/20ML
85.0000 mg/m2 | Freq: Once | INTRAVENOUS | Status: AC
  Administered 2012-10-04: 135 mg via INTRAVENOUS
  Filled 2012-10-04: qty 27

## 2012-10-04 MED ORDER — DEXTROSE 5 % IV SOLN
Freq: Once | INTRAVENOUS | Status: AC
  Administered 2012-10-04: 11:00:00 via INTRAVENOUS

## 2012-10-04 MED ORDER — FLUOROURACIL CHEMO INJECTION 2.5 GM/50ML
400.0000 mg/m2 | Freq: Once | INTRAVENOUS | Status: AC
  Administered 2012-10-04: 600 mg via INTRAVENOUS
  Filled 2012-10-04: qty 12

## 2012-10-04 NOTE — Progress Notes (Signed)
   [facility]    OFFICE PROGRESS NOTE   INTERVAL HISTORY:   He returns as scheduled. He completed another cycle of FOLFOX on 09/19/2012. He had nausea and one episode of emesis a few days after the chemotherapy. He feels well today. No diarrhea, mouth sores, or neuropathy symptoms.  Objective:  Vital signs in last 24 hours:  Blood pressure 114/74, pulse 74, temperature 97.6 F (36.4 C), temperature source Oral, resp. rate 20, height 5\' 3"  (1.6 m), weight 124 lb 9.6 oz (56.518 kg).    HEENT: no thrush or ulcers Resp: lungs clear bilaterally Cardio: regular rate and rhythm GI: no hepatomegaly, nontender, no apparent ascites Vascular: no leg edema Neuro:the vibratory sense is mildly decreased     Portacath/PICC-without erythema  Lab Results:  Lab Results  Component Value Date   WBC 4.0 10/04/2012   HGB 11.1* 10/04/2012   HCT 34.4* 10/04/2012   MCV 82.3 10/04/2012   PLT 144 10/04/2012   ANC 1.8 CEA 19.8 on 09/19/2012  Medications: I have reviewed the patient's current medications.  Assessment/Plan: 1.MetastaticGastric cancer-status post biopsy of a gastric mass on 07/20/2012 with the pathology confirming adenocarcinoma , HER-2 non-amplified  - CT 07/18/2012 consistent with a primary gastric mass and metastatic omental/peritoneal nodules  -biopsy of a peritoneal nodule 07/31/2012 consistent with metastatic adenocarcinoma  -Cycle 1 of FOLFOX on 08/02/2012  3. Multiple colon polyps documented on a colonoscopy 06/22/2012-tubular adenomas, tubulovillous adenomas, and hyperplastic polyps  4. Family history of colon cancer (mother)  5. 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 Kristin Bruins, multiple extractions are recommended  7. Anorexia, nausea, and early satiety-improved  8. 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 He had nausea following cycle 4. We will add prophylactic Decadron for 3 days following cycle 5. He will complete cycle 5 today. He is scheduled for a restaging CT evaluation prior to an office visit on 10/24/2012.   Thornton Papas, MD  10/04/2012  4:35 PM

## 2012-10-04 NOTE — Patient Instructions (Addendum)
[facility] Discharge Instructions for Patients Receiving Chemotherapy  Today you received the following chemotherapy agents Oxaliplatin, Leucovorin and Adrucil (5FU)  To help prevent nausea and vomiting after your treatment, we encourage you to take your nausea medication.   If you develop nausea and vomiting that is not controlled by your nausea medication, call the clinic.   BELOW ARE SYMPTOMS THAT SHOULD BE REPORTED IMMEDIATELY:  *FEVER GREATER THAN 100.5 F  *CHILLS WITH OR WITHOUT FEVER  NAUSEA AND VOMITING THAT IS NOT CONTROLLED WITH YOUR NAUSEA MEDICATION  *UNUSUAL SHORTNESS OF BREATH  *UNUSUAL BRUISING OR BLEEDING  TENDERNESS IN MOUTH AND THROAT WITH OR WITHOUT PRESENCE OF ULCERS  *URINARY PROBLEMS  *BOWEL PROBLEMS  UNUSUAL RASH Items with * indicate a potential emergency and should be followed up as soon as possible.  Feel free to call the clinic you have any questions or concerns. The clinic phone number is [phone].

## 2012-10-04 NOTE — Telephone Encounter (Signed)
Per staff phone call and POF I have schedueld appts.  JMW  

## 2012-10-04 NOTE — Telephone Encounter (Signed)
Gave pt appt for lab, Md, chemo and flush for July and August 2014

## 2012-10-04 NOTE — Progress Notes (Signed)
Visited with pt and his wife during his chemo tx.  Wt 124.6# up 5# since last assessment.  Pt and pt's wife state he has been doing much better and po intake has improved.  Pt states he is consuming snacks and drinking homemade shakes and smoothies with fruits, vegetables, ice cream and milk.  Pt reports no N&V and bowels are good.  Pt does not like Ensure type drinks.    Nutrition Dx: Unintended wt loss continues, but stable this week.  Intervention:  Pt has been educated in the past of high kcal/high protein diet.  Pt's wife states she has recipes.  No new information needed at this time.  Encouraged current po intake of snacks and shakes.    Monitoring, evaluation and goal:  Pt will tolerate increased kcal and protein to maintain wt and better tolerate tx.   Next visit:  To be scheduled.

## 2012-10-06 ENCOUNTER — Encounter: Payer: Self-pay | Admitting: Internal Medicine

## 2012-10-06 ENCOUNTER — Ambulatory Visit (HOSPITAL_BASED_OUTPATIENT_CLINIC_OR_DEPARTMENT_OTHER): Payer: BC Managed Care – PPO

## 2012-10-06 ENCOUNTER — Ambulatory Visit: Payer: BC Managed Care – PPO | Admitting: Oncology

## 2012-10-06 VITALS — BP 91/59 | HR 75 | Temp 98.0°F

## 2012-10-06 DIAGNOSIS — C169 Malignant neoplasm of stomach, unspecified: Secondary | ICD-10-CM

## 2012-10-06 MED ORDER — SODIUM CHLORIDE 0.9 % IJ SOLN
10.0000 mL | INTRAMUSCULAR | Status: DC | PRN
  Administered 2012-10-06: 10 mL
  Filled 2012-10-06: qty 10

## 2012-10-06 MED ORDER — HEPARIN SOD (PORK) LOCK FLUSH 100 UNIT/ML IV SOLN
500.0000 [IU] | Freq: Once | INTRAVENOUS | Status: AC | PRN
  Administered 2012-10-06: 500 [IU]
  Filled 2012-10-06: qty 5

## 2012-10-22 ENCOUNTER — Other Ambulatory Visit: Payer: Self-pay | Admitting: Oncology

## 2012-10-23 ENCOUNTER — Ambulatory Visit (HOSPITAL_COMMUNITY)
Admission: RE | Admit: 2012-10-23 | Discharge: 2012-10-23 | Disposition: A | Discharge status: Home / Self Care | Payer: BC Managed Care – PPO | Source: Ambulatory Visit | Attending: Oncology | Admitting: Oncology

## 2012-10-23 ENCOUNTER — Encounter (HOSPITAL_COMMUNITY): Payer: Self-pay

## 2012-10-23 DIAGNOSIS — C169 Malignant neoplasm of stomach, unspecified: Secondary | ICD-10-CM | POA: Insufficient documentation

## 2012-10-23 DIAGNOSIS — Z79899 Other long term (current) drug therapy: Secondary | ICD-10-CM | POA: Insufficient documentation

## 2012-10-23 DIAGNOSIS — K669 Disorder of peritoneum, unspecified: Secondary | ICD-10-CM | POA: Insufficient documentation

## 2012-10-23 DIAGNOSIS — D739 Disease of spleen, unspecified: Secondary | ICD-10-CM | POA: Insufficient documentation

## 2012-10-23 MED ORDER — IOHEXOL 300 MG/ML  SOLN
100.0000 mL | Freq: Once | INTRAMUSCULAR | Status: AC | PRN
  Administered 2012-10-23: 100 mL via INTRAVENOUS

## 2012-10-24 ENCOUNTER — Other Ambulatory Visit (HOSPITAL_BASED_OUTPATIENT_CLINIC_OR_DEPARTMENT_OTHER): Payer: BC Managed Care – PPO | Admitting: Lab

## 2012-10-24 ENCOUNTER — Inpatient Hospital Stay: Payer: BC Managed Care – PPO

## 2012-10-24 ENCOUNTER — Ambulatory Visit (HOSPITAL_BASED_OUTPATIENT_CLINIC_OR_DEPARTMENT_OTHER): Payer: BC Managed Care – PPO | Admitting: Oncology

## 2012-10-24 ENCOUNTER — Telehealth: Payer: Self-pay | Admitting: Oncology

## 2012-10-24 ENCOUNTER — Encounter: Payer: BC Managed Care – PPO | Admitting: Nutrition

## 2012-10-24 VITALS — BP 118/68 | HR 74 | Temp 98.0°F | Resp 18 | Ht 63.0 in | Wt 134.0 lb

## 2012-10-24 DIAGNOSIS — L298 Other pruritus: Secondary | ICD-10-CM

## 2012-10-24 DIAGNOSIS — C169 Malignant neoplasm of stomach, unspecified: Secondary | ICD-10-CM

## 2012-10-24 DIAGNOSIS — C786 Secondary malignant neoplasm of retroperitoneum and peritoneum: Secondary | ICD-10-CM

## 2012-10-24 DIAGNOSIS — D702 Other drug-induced agranulocytosis: Secondary | ICD-10-CM

## 2012-10-24 LAB — CBC WITH DIFFERENTIAL/PLATELET
Basophils Absolute: 0.1 10*3/uL (ref 0.0–0.1)
Eosinophils Absolute: 0.1 10*3/uL (ref 0.0–0.5)
HGB: 10.3 g/dL — ABNORMAL LOW (ref 13.0–17.1)
LYMPH%: 46.6 % (ref 14.0–49.0)
MCV: 83.2 fL (ref 79.3–98.0)
MONO%: 25.5 % — ABNORMAL HIGH (ref 0.0–14.0)
NEUT#: 0.8 10*3/uL — ABNORMAL LOW (ref 1.5–6.5)
NEUT%: 22 % — ABNORMAL LOW (ref 39.0–75.0)
Platelets: 192 10*3/uL (ref 140–400)

## 2012-10-24 NOTE — Progress Notes (Signed)
   [facility]    OFFICE PROGRESS NOTE   INTERVAL HISTORY:   He returns as scheduled. He completed another cycle of FOLFOX on 10/04/2012. No nausea and vomiting following this cycle. No mouth sores. Good appetite. He has developed a rash over the chest and abdomen. The rash is pruritic. He had numbness in the fingers a few days ago. This has resolved.  Objective:  Vital signs in last 24 hours:  Blood pressure 118/68, pulse 74, temperature 98 F (36.7 C), temperature source Oral, resp. rate 18, height 5\' 3"  (1.6 m), weight 134 lb (60.782 kg).    HEENT: No thrush or ulcers Resp: Lungs clear bilaterally Cardio: Regular rate and rhythm, split S1 GI: No hepatomegaly, no mass, nontender Vascular: No leg edema Neuro: Minimal decrease in vibratory sense at several of the fingertips, others are normal  Skin: Hyperpigmentation over the palms , acne type rash over the chest, abdomen, and back that appears to be resolving  Portacath/PICC-without erythema  Lab Results:  Lab Results  Component Value Date   WBC 3.4* 10/24/2012   HGB 10.3* 10/24/2012   HCT 32.6* 10/24/2012   MCV 83.2 10/24/2012   PLT 192 10/24/2012   ANC 0.8  X-rays: CT of the abdomen and pelvis on 07/23/2012, compared to 07/18/2012-the primary gastric mass has decreased in size. There has also been a decrease in the size and number of omental and peritoneal implants. I reviewed the CT images with Mr. Bahl and his wife  Medications: I have reviewed the patient's current medications.  Assessment/Plan: 1.MetastaticGastric cancer-status post biopsy of a gastric mass on 07/20/2012 with the pathology confirming adenocarcinoma , HER-2 non-amplified  - CT 07/18/2012 consistent with a primary gastric mass and metastatic omental/peritoneal nodules  -biopsy of a peritoneal nodule 07/31/2012 consistent with metastatic adenocarcinoma  -Cycle 1 of FOLFOX on 08/02/2012  -Restaging CT 10/23/2012 consistent with a partial  response-decrease in the primary gastric tumor and omental/peritoneal implants 3. Multiple colon polyps documented on a colonoscopy 06/22/2012-tubular adenomas, tubulovillous adenomas, and hyperplastic polyps  4. Family history of colon cancer (mother)  5. 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 Kristin Bruins, multiple extractions are recommended  7. Anorexia, nausea, and early satiety-improved  8. 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 Nausea following chemotherapy-improved with the addition of prophylactic Decadron 10. Skin rash-most likely secondary to Decadron, we will decrease the Decadron dose following this cycle 11. Neutropenia second chemotherapy-chemotherapy will be held today   Disposition:  He is computed 5 cycles of FOLFOX. There has been marked clinical and x-ray in improvement in the metastatic gastric cancer. The plan is to continue FOLFOX chemotherapy. He will return for the next cycle of FOLFOX on 11/01/2012. He is scheduled for an office visit and chemotherapy on 11/15/2012.  He will contact us for progressive rash or nausea following this cycle  Thornton Papas, MD  10/24/2012  9:32 AM

## 2012-10-24 NOTE — Telephone Encounter (Signed)
gv and printed appt sched and avs for pt...emailed MB to add tx.   °

## 2012-10-30 ENCOUNTER — Telehealth: Payer: Self-pay | Admitting: *Deleted

## 2012-10-30 NOTE — Telephone Encounter (Signed)
Per staff phone call and POF I have schedueld appts.  JMW  

## 2012-10-31 ENCOUNTER — Encounter: Payer: Self-pay | Admitting: Pharmacist

## 2012-11-01 ENCOUNTER — Ambulatory Visit: Payer: BC Managed Care – PPO | Admitting: Nutrition

## 2012-11-01 ENCOUNTER — Other Ambulatory Visit (HOSPITAL_BASED_OUTPATIENT_CLINIC_OR_DEPARTMENT_OTHER): Payer: BC Managed Care – PPO | Admitting: Lab

## 2012-11-01 ENCOUNTER — Other Ambulatory Visit: Payer: Self-pay | Admitting: Oncology

## 2012-11-01 ENCOUNTER — Encounter: Payer: BC Managed Care – PPO | Admitting: Nutrition

## 2012-11-01 ENCOUNTER — Ambulatory Visit (HOSPITAL_BASED_OUTPATIENT_CLINIC_OR_DEPARTMENT_OTHER): Payer: BC Managed Care – PPO

## 2012-11-01 VITALS — BP 108/65 | HR 71 | Temp 97.6°F | Resp 18

## 2012-11-01 DIAGNOSIS — Z5111 Encounter for antineoplastic chemotherapy: Secondary | ICD-10-CM

## 2012-11-01 DIAGNOSIS — C169 Malignant neoplasm of stomach, unspecified: Secondary | ICD-10-CM

## 2012-11-01 LAB — COMPREHENSIVE METABOLIC PANEL (CC13)
ALT: 17 U/L (ref 0–55)
AST: 15 U/L (ref 5–34)
Alkaline Phosphatase: 78 U/L (ref 40–150)
Sodium: 143 mEq/L (ref 136–145)
Total Bilirubin: 0.28 mg/dL (ref 0.20–1.20)
Total Protein: 6.9 g/dL (ref 6.4–8.3)

## 2012-11-01 LAB — CBC WITH DIFFERENTIAL/PLATELET
BASO%: 1 % (ref 0.0–2.0)
EOS%: 1.3 % (ref 0.0–7.0)
MCH: 26.2 pg — ABNORMAL LOW (ref 27.2–33.4)
MCV: 82.6 fL (ref 79.3–98.0)
MONO%: 12.7 % (ref 0.0–14.0)
RBC: 3.97 10*6/uL — ABNORMAL LOW (ref 4.20–5.82)
RDW: 18.5 % — ABNORMAL HIGH (ref 11.0–14.6)
nRBC: 0 % (ref 0–0)

## 2012-11-01 MED ORDER — DEXAMETHASONE SODIUM PHOSPHATE 10 MG/ML IJ SOLN
10.0000 mg | Freq: Once | INTRAMUSCULAR | Status: AC
  Administered 2012-11-01: 10 mg via INTRAVENOUS

## 2012-11-01 MED ORDER — OXALIPLATIN CHEMO INJECTION 100 MG/20ML
85.0000 mg/m2 | Freq: Once | INTRAVENOUS | Status: AC
  Administered 2012-11-01: 135 mg via INTRAVENOUS
  Filled 2012-11-01: qty 27

## 2012-11-01 MED ORDER — DEXTROSE 5 % IV SOLN
Freq: Once | INTRAVENOUS | Status: AC
  Administered 2012-11-01: 10:00:00 via INTRAVENOUS

## 2012-11-01 MED ORDER — SODIUM CHLORIDE 0.9 % IV SOLN
2400.0000 mg/m2 | INTRAVENOUS | Status: DC
  Administered 2012-11-01: 3750 mg via INTRAVENOUS
  Filled 2012-11-01: qty 75

## 2012-11-01 MED ORDER — PALONOSETRON HCL INJECTION 0.25 MG/5ML
0.2500 mg | Freq: Once | INTRAVENOUS | Status: AC
  Administered 2012-11-01: 0.25 mg via INTRAVENOUS

## 2012-11-01 MED ORDER — LEUCOVORIN CALCIUM INJECTION 350 MG
385.0000 mg/m2 | Freq: Once | INTRAMUSCULAR | Status: AC
  Administered 2012-11-01: 600 mg via INTRAVENOUS
  Filled 2012-11-01: qty 30

## 2012-11-01 MED ORDER — FLUOROURACIL CHEMO INJECTION 2.5 GM/50ML
400.0000 mg/m2 | Freq: Once | INTRAVENOUS | Status: AC
  Administered 2012-11-01: 600 mg via INTRAVENOUS
  Filled 2012-11-01: qty 12

## 2012-11-01 NOTE — Progress Notes (Signed)
I spoke with patient and his wife, during chemotherapy.  Weight has increased to 134 pounds documented August 5 from 124.6 pounds documented July 16.  Patient reports he is eating well.  He has no nutrition side effects.  He is not using oral nutrition supplements because he has been eating foods without difficulty.  Nutrition diagnosis: Unintended weight loss resolved.  Patient and wife were educated to continue current strategies for oral intake.  I have provided my contact information for patient to call me, if he develops any  Nutrition issues in the future.

## 2012-11-01 NOTE — Patient Instructions (Signed)
[facility] Discharge Instructions for Patients Receiving Chemotherapy  Today you received the following chemotherapy agents 5 FU ,Leucovorin,Oxaliplatin To help prevent nausea and vomiting after your treatment, we encourage you to take your nausea medication Compazine 10 mg every 6 hours as needed for nausea.  If you develop nausea and vomiting that is not controlled by your nausea medication, call the clinic.   BELOW ARE SYMPTOMS THAT SHOULD BE REPORTED IMMEDIATELY:  *FEVER GREATER THAN 100.5 F  *CHILLS WITH OR WITHOUT FEVER  NAUSEA AND VOMITING THAT IS NOT CONTROLLED WITH YOUR NAUSEA MEDICATION  *UNUSUAL SHORTNESS OF BREATH  *UNUSUAL BRUISING OR BLEEDING  TENDERNESS IN MOUTH AND THROAT WITH OR WITHOUT PRESENCE OF ULCERS  *URINARY PROBLEMS  *BOWEL PROBLEMS  UNUSUAL RASH Items with * indicate a potential emergency and should be followed up as soon as possible.  Feel free to call the clinic you have any questions or concerns. The clinic phone number is [phone].

## 2012-11-02 LAB — CEA: CEA: 3.6 ng/mL (ref 0.0–5.0)

## 2012-11-03 ENCOUNTER — Other Ambulatory Visit: Payer: Self-pay | Admitting: Nurse Practitioner

## 2012-11-03 ENCOUNTER — Ambulatory Visit (HOSPITAL_BASED_OUTPATIENT_CLINIC_OR_DEPARTMENT_OTHER): Payer: BC Managed Care – PPO | Admitting: Nurse Practitioner

## 2012-11-03 ENCOUNTER — Ambulatory Visit (HOSPITAL_BASED_OUTPATIENT_CLINIC_OR_DEPARTMENT_OTHER): Payer: BC Managed Care – PPO

## 2012-11-03 ENCOUNTER — Telehealth: Payer: Self-pay | Admitting: *Deleted

## 2012-11-03 VITALS — BP 97/64 | HR 68 | Temp 97.3°F | Resp 16

## 2012-11-03 DIAGNOSIS — K1370 Unspecified lesions of oral mucosa: Secondary | ICD-10-CM

## 2012-11-03 DIAGNOSIS — R21 Rash and other nonspecific skin eruption: Secondary | ICD-10-CM

## 2012-11-03 DIAGNOSIS — C169 Malignant neoplasm of stomach, unspecified: Secondary | ICD-10-CM

## 2012-11-03 DIAGNOSIS — K047 Periapical abscess without sinus: Secondary | ICD-10-CM

## 2012-11-03 DIAGNOSIS — Z452 Encounter for adjustment and management of vascular access device: Secondary | ICD-10-CM

## 2012-11-03 MED ORDER — CLINDAMYCIN HCL 300 MG PO CAPS: 300.0000 mg | ORAL_CAPSULE | Freq: Three times a day (TID) | ORAL | Status: DC

## 2012-11-03 MED ORDER — HEPARIN SOD (PORK) LOCK FLUSH 100 UNIT/ML IV SOLN
500.0000 [IU] | Freq: Once | INTRAVENOUS | Status: AC | PRN
  Administered 2012-11-03: 500 [IU]
  Filled 2012-11-03: qty 5

## 2012-11-03 MED ORDER — SODIUM CHLORIDE 0.9 % IJ SOLN
10.0000 mL | INTRAMUSCULAR | Status: DC | PRN
  Administered 2012-11-03: 10 mL
  Filled 2012-11-03: qty 10

## 2012-11-03 NOTE — Progress Notes (Signed)
Pt in for home infusion pump d'c. Pt c/o left jaw swelling. Pt denies pain or fevers. Pt's wife states onset of swelling was yesterday evening. Dr Myrle Sheng notified of pt's complaints. Pt will be seen by Dr Truett Perna or Ignacia Bayley. [phone] Ignacia Bayley into assess pt.

## 2012-11-03 NOTE — Telephone Encounter (Signed)
VM left requesting to be called about Joseph Salinas. Did not leave name or need. Attempted to return call at [phone]-no answer. Left VM to call back or talk with flush nurse at appointment today.

## 2012-11-04 NOTE — Progress Notes (Addendum)
OFFICE PROGRESS NOTE  Interval history:  Joseph Salinas presented to the office today for pump discontinuation. He requested evaluation of facial swelling.  His wife reports progressive swelling of the left face over the past day. No associated pain. No fever.       Objective: Temperature 97.3, heart rate 68, blood pressure 97/64.  Left face/cheek with edema, mild erythema and warmth. Left upper mid to posterior gum line with erythema, edema and tenderness. Poor dentition.   Lab Results: Lab Results  Component Value Date   WBC 6.9 11/01/2012   HGB 10.4* 11/01/2012   HCT 32.8* 11/01/2012   MCV 82.6 11/01/2012   PLT 210 11/01/2012    Chemistry:    Chemistry      Component Value Date/Time   NA 143 11/01/2012 0913   NA 139 07/17/2012 1528   K 3.9 11/01/2012 0913   K 4.2 07/17/2012 1528   CL 103 09/06/2012 1034   CL 104 07/17/2012 1528   CO2 24 11/01/2012 0913   CO2 30 07/17/2012 1528   BUN 7.0 11/01/2012 0913   BUN 11 07/17/2012 1528   CREATININE 0.7 11/01/2012 0913   CREATININE 0.9 07/17/2012 1528      Component Value Date/Time   CALCIUM 9.0 11/01/2012 0913   CALCIUM 8.8 07/17/2012 1528   ALKPHOS 78 11/01/2012 0913   ALKPHOS 54 07/17/2012 1528   AST 15 11/01/2012 0913   AST 18 07/17/2012 1528   ALT 17 11/01/2012 0913   ALT 15 07/17/2012 1528   BILITOT 0.28 11/01/2012 0913   BILITOT 0.4 07/17/2012 1528       Studies/Results: Ct Abdomen Pelvis W Contrast  10/23/2012   *RADIOLOGY REPORT*  Clinical Data: History of gastric cancer diagnosed and July 09, 2012.  Chemotherapy in progress.  CT ABDOMEN AND PELVIS WITH CONTRAST  Technique:  Multidetector CT imaging of the abdomen and pelvis was performed following the standard protocol during bolus administration of intravenous contrast.  Contrast: OMNIPAQUE IOHEXOL 300 MG/ML  SOLN  Comparison: CT of the abdomen and pelvis 07/18/2012.  Findings:  Lung Bases: Unremarkable.  Abdomen/Pelvis:  When compared to the prior examination, the infiltrative  mass in the body of the stomach appears significantly smaller, now with only some asymmetric soft tissue thickening which is most pronounced along the greater curvature in the region of the body of the stomach best demonstrated on image 14 of series 2. There is also some soft tissue extending from the lesser curvature of the body of the stomach into the gastrohepatic ligament (image 18 of series 2), which also appears decreased compared to the prior study.  Several of the previously noted omental and peritoneal implants have completely resolved or are significantly smaller in size.  The best example of this is seen on image 20 of series 2 where there is an implant immediately anterior to the stomach measuring 2.2 x 1.5 cm on today's examination (previously 3.6 x 3.2 cm).  Lesions previously noted near the splenic flexure of the colon (images 5 and 6 of series 2 of the prior examination) are no longer identified.  The previously noted lesion adjacent to the falciform ligament inferior to the liver (image 26 of series 2 of the prior examination) is also no longer visualized. A small 9 mm omental implant (image 35 of series 2) immediately beneath the hepatic flexure of the colon is slightly decreased compared to the prior examination.  There are numerous well-defined low attenuation hepatic lesions which appear very similar in  size, number and distribution compared to the prior examination and are favored to represent a combination of cysts and biliary hamartomas (several of these these lesions remain too small to definitively characterize).  There is several ill-defined areas of peripheral wedge shaped decreased attenuation/enhancement in the right lobe of the liver on today's examination which were not apparent on the prior study.  These completely normalized on the delayed phase of the examination, and are favored to represent perfusion anomalies.  No definite focal hepatic lesions are noted to suggest metastatic  disease to the liver at this time.  The appearance of the gallbladder, pancreas, bilateral adrenal glands and bilateral kidneys is unremarkable.  A 1.3 x 1.0 cm low-attenuation lesion in the spleen appears slightly larger than the prior examination.  No significant volume of ascites.  No pneumoperitoneum.  No pathologic distension of small bowel. The prostate gland and urinary bladder are unremarkable in appearance.  Musculoskeletal: There are no aggressive appearing lytic or blastic lesions noted in the visualized portions of the skeleton.  IMPRESSION: 1.  Compared to the prior study, there has been a positive response to therapy with decreased size of the primary gastric neoplasm, and decreased number and size of numerous omental and peritoneal implants, as detailed above. 2.  Slight interval enlargement of a small low attenuation lesion in the spleen.  This is well defined and would be unusual in appearance for a metastasis, but is incompletely characterized on today's examination.  Continued attention on follow-up studies is recommended. 3.  No definite evidence for hepatic metastatic disease at this time.  There are several new areas of ill-defined wedge-shaped decreased attenuation and enhancement on the portal venous phase of the examination, which are favored to represent perfusion anomalies.   Original Report Authenticated By: Trudie Reed, M.D.     Assessment/Plan:  1. Probable dental abscess.  2. Metastatic gastric cancer. Cycle 6 FOLFOX beginning 08/13/014. 3. Penicillin allergy.  Disposition-Joseph Salinas appears to have a dental abscess. He is allergic to penicillin. We prescribed a 10 day course of Clindamycin and also referred him back to Dr. Kristin Bruins. He was instructed to seek emergency evaluation for increased swelling, spreading redness or fever.   Patient seen with Dr. Truett Perna.  Lonna Cobb ANP/GNP-BC   This was a shared visit with Lonna Cobb. Joseph Salinas was interviewed and  examined.  He presented today for the day 3 pump disconnect after completing another cycle of FOLFOX. He reported swelling at the left upper face for the past several days. There is significant swelling at the left cheek and left upper gumline. I suspect he has a dental abscess. He previously saw Dr. Kristin Bruins and tooth extractions were recommended. We will place him on an antibiotic and refer him back to Dr.Kulinski.  He has a pruritic rash over the trunk. He also had this following the previous cycle of chemotherapy. The rash is most likely related to steroid use, but this could be a drug rash secondary to oxaliplatin. He will try over the counter Benadryl for the itching. Ms. Bihl will contact us if the rash progresses.

## 2012-11-06 ENCOUNTER — Other Ambulatory Visit (HOSPITAL_COMMUNITY): Payer: Self-pay | Admitting: Dentistry

## 2012-11-06 ENCOUNTER — Ambulatory Visit (HOSPITAL_COMMUNITY): Payer: Self-pay | Admitting: Dentistry

## 2012-11-07 ENCOUNTER — Other Ambulatory Visit: Payer: BC Managed Care – PPO | Admitting: Lab

## 2012-11-07 ENCOUNTER — Inpatient Hospital Stay (HOSPITAL_COMMUNITY): Admission: RE | Admit: 2012-11-07 | Payer: Self-pay | Source: Ambulatory Visit

## 2012-11-07 ENCOUNTER — Inpatient Hospital Stay: Payer: BC Managed Care – PPO

## 2012-11-07 ENCOUNTER — Ambulatory Visit: Payer: BC Managed Care – PPO | Admitting: Oncology

## 2012-11-07 ENCOUNTER — Telehealth: Payer: Self-pay | Admitting: Oncology

## 2012-11-07 ENCOUNTER — Ambulatory Visit (HOSPITAL_COMMUNITY): Payer: Self-pay | Admitting: Dentistry

## 2012-11-07 ENCOUNTER — Encounter (HOSPITAL_COMMUNITY): Payer: Self-pay | Admitting: Dentistry

## 2012-11-07 VITALS — BP 110/69 | HR 73 | Temp 98.0°F

## 2012-11-07 DIAGNOSIS — K036 Deposits [accretions] on teeth: Secondary | ICD-10-CM

## 2012-11-07 DIAGNOSIS — K029 Dental caries, unspecified: Secondary | ICD-10-CM

## 2012-11-07 DIAGNOSIS — R220 Localized swelling, mass and lump, head: Secondary | ICD-10-CM

## 2012-11-07 DIAGNOSIS — C169 Malignant neoplasm of stomach, unspecified: Secondary | ICD-10-CM

## 2012-11-07 DIAGNOSIS — K045 Chronic apical periodontitis: Secondary | ICD-10-CM

## 2012-11-07 DIAGNOSIS — K0889 Other specified disorders of teeth and supporting structures: Secondary | ICD-10-CM

## 2012-11-07 DIAGNOSIS — K0530 Chronic periodontitis, unspecified: Secondary | ICD-10-CM

## 2012-11-07 NOTE — Patient Instructions (Addendum)
The patient is to continue taking his clindamycin antibiotic therapy. The patient is to see presurgical testing on this coming Thursday morning at 9 AM. Patient was reminded to be n.p.o. after midnight on Friday morning for the surgery at 7:30 AM . The patient is to call if problems arise before then. Charlynne Pander, DDS

## 2012-11-07 NOTE — Pre-Procedure Instructions (Signed)
Joseph Salinas  11/07/2012   Your procedure is scheduled on:  11/10/12 at 7:30 AM  Report to [facility] at 5:30 AM.  Call this number if you have problems the morning of surgery: [phone]   Remember:   Do not eat food or drink liquids after midnight.   Take these medicines the morning of surgery with A SIP OF WATER: clindamycin (CLEOCIN), HYDROcodone-acetaminophen (NORCO/VICODIN), prochlorperazine (COMPAZINE)      Do not wear jewelry, make-up or nail polish.  Do not wear lotions, powders, or perfumes. You may wear deodorant.  Do not shave 48 hours prior to surgery. Men may shave face and neck.  Do not bring valuables to the hospital.  [facility] is not responsible                   for any belongings or valuables.  Contacts, dentures or bridgework may not be worn into surgery.  Leave suitcase in the car. After surgery it may be brought to your room.  For patients admitted to the hospital, checkout time is 11:00 AM the day of  discharge.   Patients discharged the day of surgery will not be allowed to drive  home.  Name and phone number of your driver: Family/friend  Special Instructions: Shower using CHG 2 nights before surgery and the night before surgery.  If you shower the day of surgery use CHG.  Use special wash - you have one bottle of CHG for all showers.  You should use approximately 1/3 of the bottle for each shower.   Please read over the following fact sheets that you were given: Pain Booklet, Coughing and Deep Breathing and Surgical Site Infection Prevention

## 2012-11-07 NOTE — Progress Notes (Signed)
11/07/2012  Patient:            Joseph Salinas Date of Birth:  1960-04-15 MRN:                [id number]  BP 110/69  Pulse 73  Temp(Src) 98 F (36.7 C) (Oral)   Joseph Salinas is a 52 year old male previously evaluated on 08/10/2012.  The patient has a medical history significant for gastric cancer with active chemotherapy with Dr. Truett Perna. The patient was found to have multiple dental problems with the need for multiple extractions with alveoloplasty and gross to the remaining dentition as well as additional dental restorations at that time. It was determined after a discussion with Dr. Truett Perna, that dental treatment in operating room would be delayed until additional doses of current chemotherapy regimen could be administered.    The patient, however, developed left facial swelling last Friday and was prescribed clindamycin oral antibiotic therapy. Patient is taking 300 mg every 8 hours at this time. Patient was then referred to dental medicine for evaluation and treatment. The patient now presents for reevaluation of dental condition and to discuss proceeding with operating room procedures this coming Friday, 11/10/2012 in the operating room with general anesthesia at [facility].   Premedication: none required.  Medical Hx Update:  Past Medical History  Diagnosis Date  . Colon polyp   . Gastric cancer 08/09/2012  . ALLERGIES/ADVERSE DRUG REACTIONS: Allergies  Allergen Reactions  . Penicillins Rash   MEDICATIONS: Current Outpatient Prescriptions  Medication Sig Dispense Refill  . acetaminophen (TYLENOL) 500 MG tablet Take 1,000 mg by mouth every 6 (six) hours as needed for pain.      . Calcium Carbonate Antacid (ALKA-SELTZER ANTACID PO) Take 2 tablets by mouth 3 (three) times daily as needed (upset stomach).      . clindamycin (CLEOCIN) 300 MG capsule Take 1 capsule (300 mg total) by mouth 3 (three) times daily.  30 capsule  0  . dexamethasone (DECADRON) 4 MG tablet Take  1 tablet (4 mg total) by mouth 2 (two) times daily with a meal. Begin day after chemo. Take for 3 days.  12 tablet  4  . HYDROcodone-acetaminophen (NORCO/VICODIN) 5-325 MG per tablet Take 1 tablet by mouth every 6 (six) hours as needed for pain.  60 tablet  0  . lidocaine-prilocaine (EMLA) cream Apply 1 application topically as needed. Apply 1 tsp to [facility] site 1-2 hours prior to stick and cover with plastic wrap to numb site      . prochlorperazine (COMPAZINE) 10 MG tablet Take 10 mg by mouth every 6 (six) hours as needed (nausea).       No current facility-administered medications for this visit.   C/C: Patient was referred for evaluation of left facial swelling. HPI:  Patient developed significant left facial swelling and presented to Dr. Truett Perna on 11/03/2012. Patient was prescribed clindamycin 300 mg every 8 hours for 10 days. Patient was then referred to dental medicine for evaluation and discussion of treatment options at this time.  Patient currently is complaining of some left facial swelling in the upper left molar area. Patient has had significant resolution of the swelling by patient and wife report. Patient is describing dental pain that is sharp at times and currently only hurts when pressure is applied to the upper left cheek.  The pain reached an intensity of 3/10 on Friday. The pain is currently 0/10 by patient report. The patient is interested in proceeding  with dental extractions on this coming Friday in the operating room.  DENTAL EXAM: General: The patient is a well-developed, well-nourished male in no acute distress. Vitals:  BP 110/69  Pulse 73  Temp(Src) 98 F (36.7 C) (Oral) Extraoral Exam: No submandibular lymphadenopathy.  No TMJ Symptoms. No significant facial swelling is noted today. Intraoral  Exam: Normal Saliva. No evidence of abscess formation. Dentition: As before. Periodontal: Patient with chronic, advanced periodontal disease with plaque and calculus  accumulations, generalized gingival recession, generalized tooth mobility. Caries: As before. Endodontic: No acute pulpitits symptoms today. Periapical pathology is associated with tooth numbers 14,15,  and 16 at this time per review of orthopantogram.  C&B: No crown restorations Prosthodontic:  No partial dentures. Occlusion: Patient with a poor occlusal scheme secondary to multiple missing teeth, supra-eruption and drifting of the unopposed teeth into the edentulous areas, and lack of replacement of missing teeth with dental prostheses. Radiographic Interpretation: A new orthopantogram was taken. There multiple missing teeth. There is supra-eruption and drifting of the unopposed teeth into the edentulous areas. Periapical pathology and radiolucency associated with tooth numbers 14,15, and 16. Dental caries are noted. Moderate to severe bone loss noted. Radiographic calculus is noted.  ASSESSMENT: 1. History of Left facial swelling-resolved. 2. History of acute pulpitis symptoms 3. Chronic apical periodontitis 4. Chronic periodontitis 5. Accretions 6. Malocclusion 7. Multiple mobile teeth 8. Multiple missing teeth  Plan: 1. I discussed the risks, benefits, complications of various treatment options with the patient in relationship to his medical and dental conditions and current active chemotherapy. We discussed no treatment, multiple dental extractions with alveoloplasty, pre-prosthetic surgery as indicated, periodontal therapy, dental restorations, root canal therapy, crown and bridge therapy, implant therapy, and replacement of missing teeth as indicated. The patient and wife currently wish to proceed with operating room procedure with general anesthesia on this coming Friday, 11/10/2012 at 7:30 AM at [facility]. Patient agrees to proceed with multiple extractions with alveoloplasty and pre-prosthetic surgery as indicated with gross debridement of remaining dentition. The patient  will then followup with a dentist of his choice for other dental restorations and evaluation for replacement of missing teeth once medically stable after the completion of chemotherapy. The patient is currently scheduled for presurgical testing and review of labs for this coming Thursday to ensure that CBC is acceptable to proceed with dental procedures. The patient and wife are aware of the potential risks to include, but not excluded, complications such as bleeding, bruising, swelling, infection, root tip fracture, sinus involvement, nerve damage, and other risks of general anesthesia.  2. Discussion of findings and coordination of care with the medical and dental providers for this patient.  Charlynne Pander, DDS

## 2012-11-07 NOTE — Telephone Encounter (Signed)
Per 8/15 pof referral entered. Referral entered for pt to see Dr. Kristin Bruins. Pt on schedule to see Dr. Kristin Bruins today (11/07/12) no other orders per 8/15 pof.

## 2012-11-08 ENCOUNTER — Encounter (HOSPITAL_COMMUNITY): Payer: Self-pay | Admitting: Pharmacy Technician

## 2012-11-09 ENCOUNTER — Encounter (HOSPITAL_COMMUNITY): Payer: Self-pay

## 2012-11-09 ENCOUNTER — Ambulatory Visit: Payer: BC Managed Care – PPO | Admitting: Oncology

## 2012-11-09 ENCOUNTER — Encounter (HOSPITAL_COMMUNITY)
Admission: RE | Admit: 2012-11-09 | Discharge: 2012-11-09 | Disposition: A | Discharge status: Home / Self Care | Payer: Self-pay | Source: Ambulatory Visit | Attending: Dentistry | Admitting: Dentistry

## 2012-11-09 MED ORDER — CLINDAMYCIN PHOSPHATE 600 MG/50ML IV SOLN
600.0000 mg | Freq: Once | INTRAVENOUS | Status: AC
  Administered 2012-11-10: 600 mg via INTRAVENOUS
  Filled 2012-11-09: qty 50

## 2012-11-10 ENCOUNTER — Encounter (HOSPITAL_COMMUNITY): Payer: Self-pay | Admitting: *Deleted

## 2012-11-10 ENCOUNTER — Ambulatory Visit (HOSPITAL_COMMUNITY): Payer: Self-pay | Admitting: Anesthesiology

## 2012-11-10 ENCOUNTER — Encounter (HOSPITAL_COMMUNITY)
Admission: RE | Disposition: A | Discharge status: Home / Self Care | Payer: Self-pay | Source: Ambulatory Visit | Attending: Dentistry

## 2012-11-10 ENCOUNTER — Ambulatory Visit (HOSPITAL_COMMUNITY)
Admission: RE | Admit: 2012-11-10 | Discharge: 2012-11-10 | Disposition: A | Discharge status: Home / Self Care | Payer: Self-pay | Source: Ambulatory Visit | Attending: Dentistry | Admitting: Dentistry

## 2012-11-10 ENCOUNTER — Encounter (HOSPITAL_COMMUNITY): Payer: Self-pay | Admitting: Anesthesiology

## 2012-11-10 DIAGNOSIS — K0530 Chronic periodontitis, unspecified: Secondary | ICD-10-CM | POA: Diagnosis present

## 2012-11-10 DIAGNOSIS — K045 Chronic apical periodontitis: Secondary | ICD-10-CM

## 2012-11-10 DIAGNOSIS — Z79899 Other long term (current) drug therapy: Secondary | ICD-10-CM | POA: Insufficient documentation

## 2012-11-10 DIAGNOSIS — K036 Deposits [accretions] on teeth: Secondary | ICD-10-CM | POA: Insufficient documentation

## 2012-11-10 DIAGNOSIS — D649 Anemia, unspecified: Secondary | ICD-10-CM | POA: Insufficient documentation

## 2012-11-10 DIAGNOSIS — C169 Malignant neoplasm of stomach, unspecified: Secondary | ICD-10-CM | POA: Insufficient documentation

## 2012-11-10 DIAGNOSIS — Z87891 Personal history of nicotine dependence: Secondary | ICD-10-CM | POA: Insufficient documentation

## 2012-11-10 HISTORY — PX: MULTIPLE EXTRACTIONS WITH ALVEOLOPLASTY: SHX5342

## 2012-11-10 LAB — CBC
MCH: 26.9 pg (ref 26.0–34.0)
Platelets: 223 10*3/uL (ref 150–400)
RBC: 3.91 MIL/uL — ABNORMAL LOW (ref 4.22–5.81)
RDW: 18.8 % — ABNORMAL HIGH (ref 11.5–15.5)

## 2012-11-10 LAB — BASIC METABOLIC PANEL
CO2: 26 mEq/L (ref 19–32)
Calcium: 9.3 mg/dL (ref 8.4–10.5)
GFR calc Af Amer: >90 mL/min (ref >90)
GFR calc non Af Amer: >90 mL/min (ref >90)
Sodium: 138 mEq/L (ref 135–145)

## 2012-11-10 SURGERY — MULTIPLE EXTRACTION WITH ALVEOLOPLASTY
Anesthesia: General | Site: Mouth | Wound class: Clean Contaminated

## 2012-11-10 MED ORDER — BUPIVACAINE-EPINEPHRINE 0.5% -1:200000 IJ SOLN
INTRAMUSCULAR | Status: DC | PRN
  Administered 2012-11-10 (×2): 1.8 mL

## 2012-11-10 MED ORDER — LIDOCAINE-EPINEPHRINE 2 %-1:100000 IJ SOLN
INTRAMUSCULAR | Status: AC
  Filled 2012-11-10: qty 10.2

## 2012-11-10 MED ORDER — ISOPROPYL ALCOHOL 70 % SOLN
Status: DC | PRN
  Administered 2012-11-10: 1 via TOPICAL

## 2012-11-10 MED ORDER — ONDANSETRON HCL 4 MG/2ML IJ SOLN
INTRAMUSCULAR | Status: DC | PRN
  Administered 2012-11-10: 4 mg via INTRAVENOUS

## 2012-11-10 MED ORDER — DEXTROSE 5 % IV SOLN
INTRAVENOUS | Status: DC | PRN
  Administered 2012-11-10: 08:00:00 via INTRAVENOUS

## 2012-11-10 MED ORDER — MIDAZOLAM HCL 2 MG/2ML IJ SOLN: 0.5000 mg | Freq: Once | INTRAMUSCULAR | Status: DC | PRN

## 2012-11-10 MED ORDER — HEMOSTATIC AGENTS (NO CHARGE) OPTIME
TOPICAL | Status: DC | PRN
  Administered 2012-11-10: 1 via TOPICAL

## 2012-11-10 MED ORDER — PHENYLEPHRINE HCL 10 MG/ML IJ SOLN
INTRAMUSCULAR | Status: DC | PRN
  Administered 2012-11-10 (×3): 40 ug via INTRAVENOUS
  Administered 2012-11-10 (×2): 80 ug via INTRAVENOUS
  Administered 2012-11-10 (×8): 40 ug via INTRAVENOUS

## 2012-11-10 MED ORDER — MEPERIDINE HCL 25 MG/ML IJ SOLN: 6.2500 mg | INTRAMUSCULAR | Status: DC | PRN

## 2012-11-10 MED ORDER — PROPOFOL 10 MG/ML IV BOLUS
INTRAVENOUS | Status: DC | PRN
  Administered 2012-11-10: 20 mg via INTRAVENOUS
  Administered 2012-11-10: 150 mg via INTRAVENOUS

## 2012-11-10 MED ORDER — OXYMETAZOLINE HCL 0.05 % NA SOLN
NASAL | Status: AC
  Filled 2012-11-10: qty 15

## 2012-11-10 MED ORDER — OXYMETAZOLINE HCL 0.05 % NA SOLN
1.0000 | Freq: Once | NASAL | Status: DC
  Filled 2012-11-10: qty 15

## 2012-11-10 MED ORDER — OXYCODONE-ACETAMINOPHEN 5-325 MG PO TABS: ORAL_TABLET | ORAL | Status: DC

## 2012-11-10 MED ORDER — OXYCODONE HCL 5 MG/5ML PO SOLN: 5.0000 mg | Freq: Once | ORAL | Status: DC | PRN

## 2012-11-10 MED ORDER — HYDROMORPHONE HCL PF 1 MG/ML IJ SOLN: 0.2500 mg | INTRAMUSCULAR | Status: DC | PRN

## 2012-11-10 MED ORDER — LIDOCAINE-EPINEPHRINE 2 %-1:100000 IJ SOLN
INTRAMUSCULAR | Status: DC | PRN
  Administered 2012-11-10 (×6): 1.7 mL

## 2012-11-10 MED ORDER — FENTANYL CITRATE 0.05 MG/ML IJ SOLN
INTRAMUSCULAR | Status: DC | PRN
  Administered 2012-11-10 (×2): 50 ug via INTRAVENOUS

## 2012-11-10 MED ORDER — BUPIVACAINE-EPINEPHRINE (PF) 0.5% -1:200000 IJ SOLN
INTRAMUSCULAR | Status: AC
  Filled 2012-11-10: qty 3.6

## 2012-11-10 MED ORDER — PROPOFOL 10 MG/ML IV BOLUS: INTRAVENOUS | Status: DC | PRN

## 2012-11-10 MED ORDER — PROMETHAZINE HCL 25 MG/ML IJ SOLN: 6.2500 mg | INTRAMUSCULAR | Status: DC | PRN

## 2012-11-10 MED ORDER — NEOSTIGMINE METHYLSULFATE 1 MG/ML IJ SOLN
INTRAMUSCULAR | Status: DC | PRN
  Administered 2012-11-10: 4 mg via INTRAVENOUS

## 2012-11-10 MED ORDER — ROCURONIUM BROMIDE 100 MG/10ML IV SOLN
INTRAVENOUS | Status: DC | PRN
  Administered 2012-11-10: 30 mg via INTRAVENOUS

## 2012-11-10 MED ORDER — LIDOCAINE HCL (CARDIAC) 20 MG/ML IV SOLN
INTRAVENOUS | Status: DC | PRN
  Administered 2012-11-10: 20 mg via INTRAVENOUS

## 2012-11-10 MED ORDER — OXYCODONE HCL 5 MG PO TABS: 5.0000 mg | ORAL_TABLET | Freq: Once | ORAL | Status: DC | PRN

## 2012-11-10 MED ORDER — MIDAZOLAM HCL 5 MG/5ML IJ SOLN
INTRAMUSCULAR | Status: DC | PRN
  Administered 2012-11-10 (×2): 1 mg via INTRAVENOUS

## 2012-11-10 MED ORDER — GLYCOPYRROLATE 0.2 MG/ML IJ SOLN
INTRAMUSCULAR | Status: DC | PRN
  Administered 2012-11-10: 0.6 mg via INTRAVENOUS

## 2012-11-10 MED ORDER — LIDOCAINE-EPINEPHRINE 2 %-1:100000 IJ SOLN
INTRAMUSCULAR | Status: AC
  Filled 2012-11-10: qty 1.7

## 2012-11-10 MED ORDER — LIDOCAINE HCL (CARDIAC) 20 MG/ML IV SOLN: INTRAVENOUS | Status: DC | PRN

## 2012-11-10 MED ORDER — SUCCINYLCHOLINE CHLORIDE 20 MG/ML IJ SOLN
INTRAMUSCULAR | Status: DC | PRN
  Administered 2012-11-10: 100 mg via INTRAVENOUS

## 2012-11-10 MED ORDER — LACTATED RINGERS IV SOLN
INTRAVENOUS | Status: DC | PRN
  Administered 2012-11-10 (×2): via INTRAVENOUS

## 2012-11-10 MED ORDER — 0.9 % SODIUM CHLORIDE (POUR BTL) OPTIME
TOPICAL | Status: DC | PRN
  Administered 2012-11-10: 1000 mL

## 2012-11-10 SURGICAL SUPPLY — 37 items
ALCOHOL 70% 16 OZ (MISCELLANEOUS) ×2 IMPLANT
ATTRACTOMAT 16X20 MAGNETIC DRP (DRAPES) ×2 IMPLANT
BLADE SURG 15 STRL LF DISP TIS (BLADE) ×2 IMPLANT
BLADE SURG 15 STRL SS (BLADE) ×2
CLOTH BEACON ORANGE TIMEOUT ST (SAFETY) ×2 IMPLANT
COVER SURGICAL LIGHT HANDLE (MISCELLANEOUS) ×2 IMPLANT
CRADLE DONUT ADULT HEAD (MISCELLANEOUS) ×2 IMPLANT
GAUZE PACKING FOLDED 2  STR (GAUZE/BANDAGES/DRESSINGS) ×1
GAUZE PACKING FOLDED 2 STR (GAUZE/BANDAGES/DRESSINGS) ×1 IMPLANT
GAUZE SPONGE 4X4 16PLY XRAY LF (GAUZE/BANDAGES/DRESSINGS) ×2 IMPLANT
GLOVE BIOGEL PI IND STRL 6 (GLOVE) ×1 IMPLANT
GLOVE BIOGEL PI INDICATOR 6 (GLOVE) ×1
GLOVE SURG ORTHO 8.0 STRL STRW (GLOVE) ×2 IMPLANT
GLOVE SURG SS PI 6.0 STRL IVOR (GLOVE) ×4 IMPLANT
GOWN PREVENTION PLUS LG XLONG (DISPOSABLE) ×2 IMPLANT
GOWN STRL REIN 3XL LVL4 (GOWN DISPOSABLE) ×2 IMPLANT
HEMOSTAT SURGICEL .5X2 ABSORB (HEMOSTASIS) IMPLANT
KIT BASIN OR (CUSTOM PROCEDURE TRAY) ×2 IMPLANT
KIT ROOM TURNOVER OR (KITS) ×2 IMPLANT
MANIFOLD NEPTUNE WASTE (CANNULA) ×2 IMPLANT
NEEDLE BLUNT 16X1.5 OR ONLY (NEEDLE) ×2 IMPLANT
NEEDLE DENTAL 27 LONG (NEEDLE) IMPLANT
NS IRRIG 1000ML POUR BTL (IV SOLUTION) ×2 IMPLANT
PACK EENT II TURBAN DRAPE (CUSTOM PROCEDURE TRAY) ×2 IMPLANT
PAD ARMBOARD 7.5X6 YLW CONV (MISCELLANEOUS) ×4 IMPLANT
SPONGE SURGIFOAM ABS GEL 100 (HEMOSTASIS) IMPLANT
SPONGE SURGIFOAM ABS GEL 12-7 (HEMOSTASIS) ×2 IMPLANT
SPONGE SURGIFOAM ABS GEL SZ50 (HEMOSTASIS) IMPLANT
SUCTION FRAZIER TIP 10 FR DISP (SUCTIONS) ×2 IMPLANT
SUT CHROMIC 3 0 PS 2 (SUTURE) ×8 IMPLANT
SUT CHROMIC 4 0 P 3 18 (SUTURE) IMPLANT
SYR 50ML SLIP (SYRINGE) ×2 IMPLANT
TOWEL OR 17X24 6PK STRL BLUE (TOWEL DISPOSABLE) IMPLANT
TOWEL OR 17X26 10 PK STRL BLUE (TOWEL DISPOSABLE) ×2 IMPLANT
TUBE CONNECTING 12X1/4 (SUCTIONS) ×2 IMPLANT
WATER STERILE IRR 1000ML POUR (IV SOLUTION) ×2 IMPLANT
YANKAUER SUCT BULB TIP NO VENT (SUCTIONS) ×2 IMPLANT

## 2012-11-10 NOTE — Preoperative (Signed)
Beta Blockers   Reason not to administer Beta Blockers:Not Applicable 

## 2012-11-10 NOTE — Progress Notes (Signed)
PRE-OPERATIVE NOTE:  11/10/2012 Daniela R Thier [id number]  VITALS: BP 139/80  Pulse 69  Temp(Src) 96.9 F (36.1 C) (Oral)  Resp 18  SpO2 100%  Lab Results  Component Value Date   WBC 6.0 11/10/2012   HGB 10.5* 11/10/2012   HCT 32.0* 11/10/2012   MCV 81.8 11/10/2012   PLT 223 11/10/2012   BMET BMET    Component Value Date/Time   NA 143 11/01/2012 0913   NA 139 07/17/2012 1528   K 3.9 11/01/2012 0913   K 4.2 07/17/2012 1528   CL 103 09/06/2012 1034   CL 104 07/17/2012 1528   CO2 24 11/01/2012 0913   CO2 30 07/17/2012 1528   GLUCOSE 110 11/01/2012 0913   GLUCOSE 92 09/06/2012 1034   GLUCOSE 94 07/17/2012 1528   BUN 7.0 11/01/2012 0913   BUN 11 07/17/2012 1528   CREATININE 0.7 11/01/2012 0913   CREATININE 0.9 07/17/2012 1528   CALCIUM 9.0 11/01/2012 0913   CALCIUM 8.8 07/17/2012 1528   GFRNONAA >90 07/01/2012 1111   GFRAA >90 07/01/2012 1111     Lab Results  Component Value Date   INR 1.03 07/31/2012   No results found for this basename: PTT     Chrstopher R Teichert presents for dental procedures in the operating room.   SUBJECTIVE: The patient denies any acute medical or dental changes and agrees to proceed with treatment as planned.  EXAM: No sign of acute dental changes.Facial swelling hs improved significantly by patient report.  ASSESSMENT: Patient is affected by history of facial swelling, chronic apical periodontitis, chronic periodontitis, accretions, and loose teeth.  PLAN: Patient agrees to proceed with treatment as planned in the operating room as previously discussed and accepts the risks, benefits, complications of the proposed treatment.  Charlynne Pander, DDS

## 2012-11-10 NOTE — Progress Notes (Signed)
Report given to kay rn as caregiver 

## 2012-11-10 NOTE — Anesthesia Postprocedure Evaluation (Signed)
  Anesthesia Post-op Note  Patient: Joseph Salinas  Procedure(s) Performed: Procedure(s): extraction of tooth #'s 1, 2,10,14,15,16,17,18, 23, 24, 25, 26, 32 with alveoloplasty and gross debridement of remaining teeth (N/A)  Patient Location: PACU  Anesthesia Type:General  Level of Consciousness: awake, alert , oriented and patient cooperative  Airway and Oxygen Therapy: Patient Spontanous Breathing  Post-op Pain: none  Post-op Assessment: Post-op Vital signs reviewed, Patient's Cardiovascular Status Stable, Respiratory Function Stable, Patent Airway, No signs of Nausea or vomiting and Pain level controlled  Post-op Vital Signs: Reviewed and stable  Complications: No apparent anesthesia complications

## 2012-11-10 NOTE — H&P (Signed)
11/10/2012   Patient:            Joseph Salinas Date of Birth:  05-Jun-1960 MRN:                [id number]  Patient denies acute dental or medical changes.  Facial swelling has resolved significantly. Please see progress note of Dr. Truett Perna for use as H and P for dental OR procedure.  Charlynne Pander, DDS    [facility]  OFFICE PROGRESS NOTE  INTERVAL HISTORY:  He returns as scheduled. He completed another cycle of FOLFOX on 10/04/2012. No nausea and vomiting following this cycle. No mouth sores. Good appetite. He has developed a rash over the chest and abdomen. The rash is pruritic. He had numbness in the fingers a few days ago. This has resolved.  Objective:  Vital signs in last 24 hours:  Blood pressure 118/68, pulse 74, temperature 98 F (36.7 C), temperature source Oral, resp. rate 18, height 5\' 3"  (1.6 m), weight 134 lb (60.782 kg).  HEENT: No thrush or ulcers  Resp: Lungs clear bilaterally  Cardio: Regular rate and rhythm, split S1  GI: No hepatomegaly, no mass, nontender  Vascular: No leg edema  Neuro: Minimal decrease in vibratory sense at several of the fingertips, others are normal  Skin: Hyperpigmentation over the palms , acne type rash over the chest, abdomen, and back that appears to be resolving  Portacath/PICC-without erythema  Lab Results:  Lab Results   Component  Value  Date    WBC  3.4*  10/24/2012    HGB  10.3*  10/24/2012    HCT  32.6*  10/24/2012    MCV  83.2  10/24/2012    PLT  192  10/24/2012   ANC 0.8  X-rays: CT of the abdomen and pelvis on 07/23/2012, compared to 07/18/2012-the primary gastric mass has decreased in size. There has also been a decrease in the size and number of omental and peritoneal implants. I reviewed the CT images with Joseph Salinas and his wife  Medications: I have reviewed the patient's current medications.  Assessment/Plan:  1.MetastaticGastric cancer-status post biopsy of a gastric mass on 07/20/2012 with the pathology  confirming adenocarcinoma , HER-2 non-amplified  - CT 07/18/2012 consistent with a primary gastric mass and metastatic omental/peritoneal nodules  -biopsy of a peritoneal nodule 07/31/2012 consistent with metastatic adenocarcinoma  -Cycle 1 of FOLFOX on 08/02/2012  -Restaging CT 10/23/2012 consistent with a partial response-decrease in the primary gastric tumor and omental/peritoneal implants  3. Multiple colon polyps documented on a colonoscopy 06/22/2012-tubular adenomas, tubulovillous adenomas, and hyperplastic polyps  4. Family history of colon cancer (mother)  5. 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 Kristin Bruins, multiple extractions are recommended  7. Anorexia, nausea, and early satiety-improved  8. 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 Nausea following chemotherapy-improved with the addition of prophylactic Decadron  10. Skin rash-most likely secondary to Decadron, we will decrease the Decadron dose following this cycle  11. Neutropenia second chemotherapy-chemotherapy will be held today  Disposition:  He is computed 5 cycles of FOLFOX. There has been marked clinical and x-ray in improvement in the metastatic gastric cancer. The plan is to continue FOLFOX chemotherapy. He will return for the next cycle of FOLFOX on 11/01/2012. He is scheduled for an office visit and chemotherapy on 11/15/2012.  He will contact us for progressive rash  or nausea following this cycle  Thornton Papas, MD  10/24/2012  9:32 AM

## 2012-11-10 NOTE — Transfer of Care (Signed)
Immediate Anesthesia Transfer of Care Note  Patient: Joseph Salinas  Procedure(s) Performed: Procedure(s): extraction of tooth #'s 1, 2,10,14,15,16,17,18, 23, 24, 25, 26, 32 with alveoloplasty and gross debridement of remaining teeth (N/A)  Patient Location: PACU  Anesthesia Type:General  Level of Consciousness: awake and patient cooperative  Airway & Oxygen Therapy: Patient Spontanous Breathing and Patient connected to nasal cannula oxygen  Post-op Assessment: Report given to PACU RN, Post -op Vital signs reviewed and stable and Patient moving all extremities  Post vital signs: Reviewed and stable  Complications: No apparent anesthesia complications

## 2012-11-10 NOTE — Op Note (Signed)
Patient:            Joseph Salinas Date of Birth:  1961/02/13 MRN:                [id number]   DATE OF PROCEDURE:  11/10/2012               OPERATIVE REPORT   PREOPERATIVE DIAGNOSES: 1. Gastric cancer 2. Active chemotherapy 3. History of facial swelling 4. Chronic apical periodontitis 5. Chronic periodontitis 6. Accretions  POSTOPERATIVE DIAGNOSES: 1. Gastric cancer 2. Active chemotherapy 3. History of facial swelling 4. Chronic apical periodontitis 5. Chronic periodontitis 6. Accretions  OPERATIONS: 1. Multiple extraction of tooth numbers 1, 2, 10, 14, 15, 16, 17, 18, 23, 24, 25, 26, and 32 2. Four Quadrants of alveoloplasty 3. Gross debridement of remaining dentition   SURGEON: Charlynne Pander, DDS  ASSISTANT: Rory Percy, (dental assistant)  ANESTHESIA: General anesthesia via nasoendotracheal tube.  MEDICATIONS: 1. Clindamycin 600 mg IV prior to invasive dental procedures. 2. Local anesthesia with a total utilization of 5 carpules each containing 34 mg of lidocaine with 0.017 mg of epinephrine as well as two carpules each containing 9 mg of bupivacaine with 0.009 mg of epinephrine.  SPECIMENS: There are 13 teeth that were discarded.  DRAINS: None  CULTURES: None  COMPLICATIONS: None   ESTIMATED BLOOD LOSS: 100 mLs.  INTRAVENOUS FLUIDS: 1800 mLs of Lactated ringers solution.  INDICATIONS: The patient was recently diagnosed with gastric cancer.  A dental consultation was then requested to evaluate facial swelling.  The patient was examined and treatment planned for multiple extractions with alveoloplasty and pre-prosthetic surgery as indicated along with gross debridement of remaining dentition.  This treatment plan was formulated to decrease the risks and complications associated with dental infection from affecting the patient's systemic health while undergoing active chemotherapy.  OPERATIVE FINDINGS: Patient was examined operating room number 8.  The  teeth were identified for extraction. The patient was noted be affected by chronic periodontitis, chronic apical periodontitis, history of facial swelling, and accretions.   DESCRIPTION OF PROCEDURE: Patient was brought to the main operating room number 8. Patient was then placed in the supine position on the operating table. general anesthesia was then induced per the anesthesia team. The patient was then prepped and draped in the usual manner for dental medicine procedure. A timeout was performed. The patient was identified and procedures were verified. A throat pack was placed at this time. The oral cavity was then thoroughly examined with the findings noted above. The patient was then ready for dental medicine procedure as follows:  Local anesthesia was then administered sequentially with a total utilization of 5 carpules each containing 34 mg of lidocaine with 0.017 mg of epinephrine as well as two carpules  each containing 9 mg bupivacaine with 0.009 mg of epinephrine.  The Maxillary left and right quadrants first approached. Anesthesia was then delivered utilizing infiltration with lidocaine with epinephrine. A #15 blade incision was then made from the maxillary right tuberosity and extended to the mesial #4.  A  surgical flap was then carefully reflected. Appropriate amounts of buccal and interseptal bone were then removed utilizing a surgical handpiece and bur and copious amounts of sterile water.  The teeth were then subluxated with a series of straight elevators. Tooth numbers 2 and 3 were then removed with a 53R forceps without complications. Alveoloplasty was then performed utilizing a ronguers and bone file. The surgical site was then irrigated with copious amounts of  sterile saline. The tissues were approximated and trimmed appropriately. A piece of Surgifoam was placed in the extraction sockets appropriately. The surgical site was then closed from the distal of the tuberosity and extended to  the distal of #4 utilizing 3-0 chromic gut suture in a continuous interrupted suture technique x1.  Tooth #10 was then approached and removed with a 150 forceps without complications.  The socket was curetted and compressed appropriately. The socket was irrigated with copious postural saline. The socket area was closed utilizing a figure-of-eight suture technique and 3-0 chromic gut material x1.  At this point time the maxillary left surgical site was approached. A 15 blade incision was made from the maxillary left tuberosity and extended to the mesial #13 a surgical flap was then carefully reflected. Appropriate amounts of buccal and interseptal bone was removed with a surgical handpiece and bur and copious amounts sterile water. The teeth were then subluxated with a series of straight elevators. Tooth numbers 16, 15, and 14 were then removed with a 53L forceps without complications. Alveoloplasty was then performed utilizing a rongeur and bone file. The tissues were then approximated and trimmed appropriately.  A piece of Surgifoam was then placed in the extraction sockets appropriately. The surgical sitewas then closed from the maxillary left tuberosity and extended to the distal 13 utilizing 3-0 chromic gut suture in a continuous interrupted suture technique x1.  At this point time, the mandibular quadrants were approached. The patient was given bilateral inferior alveolar nerve blocks and long buccal nerve blocks utilizing the bupivacaine with epinephrine. Further infiltration was then achieved utilizing the lidocaine with epinephrine. A 15 blade incision was then made from the distal of number of #17 and extended to the distal of #20.  A surgical flap was then carefully reflected. Appropriate amounts of buccal and interseptal bone were then removed utilizing a surgical handpiece and copious amount of sterile water. Tooth numbers 17 and 18 were then removed utilizing a 23 forceps without complications.  Alveoloplasty was then performed utilizing a rongeurs and bone file. The tissues were approximated and trimmed appropriately. The surgical sites were then irrigated with copious amounts of sterile saline. The mandibular left surgical site was then closed from the distal of #17 and extended to the distal of #20 utilizing 3-0 chromic gut suture in a continuous interrupted suture technique x1.  The mandibular anterior surgical site was then approached. Tooth numbers 23, 24, 25, and 26 were then removed with a 151 forceps without complications. A 15 blade incision was then made from the mesial numbers 22 and extended to the mesial numbers 27 a surgical flap was then carefully reflected. Alveoloplasty was then performed utilizing a rongeur and bone file. The tissues were then approximated and trimmed appropriately. The surgical site was then irrigated with copious amounts of sterile saline. The surgical site was then closed from the mesial numbers 22 to the mesial of #27 utilizing 3-0 chromic gut suture in a continuous interrupted suture technique x1.  The mandibular right surgical site was then approached. A 15 blade incision was made from the distal of #32 and extended to the mesial of #30.  Tooth #32 was then subluxated and removed with a 23 forceps without complications. Alveoloplasty was then performed utilizing a rongeur and bone file. The tissues were approximated and trimmed appropriately. The surgical site was then irrigated with copious of sterile saline. The mandibular right surgical site was then closed from the distal of #32 and extended to the mesial of #  30 utilizing 3-0 chromic gut suture in a continuous interrupted suture technique x1.  Remaining dentition was then approached. A sonic scaler was used to remove significant accretions. A series of hand curettes were then utilized refine removal of accretions. The sonic scaler was then again used to further refine removal of accretions. The entire  mouth was then irrigated with copious of sterile saline.  The patient was examined for complications, seeing none, the dental medicine procedure was deemed to be complete. The throat pack was removed at this time. A series of 4 x 4 gauze were placed in the mouth to aid hemostasis. The patient was then handed over to the anesthesia team for final disposition. After an appropriate amount of time, the patient was extubated and taken to the postanesthsia care unit with stable vital signs and a good condition. All counts were correct for the dental medicine procedure. The patient is to complete his current clindamycin antibiotic therapy. The patient is to use Percocet 5/325 as directed for his pain medication. The patient is to return to dental medicine in approximately 7-10 days for evaluation of healing and suture removal.  Charlynne Pander, DDS.

## 2012-11-10 NOTE — Anesthesia Preprocedure Evaluation (Addendum)
Anesthesia Evaluation  Patient identified by MRN, date of birth, ID band Patient awake    Reviewed: Allergy & Precautions, H&P , NPO status , Patient's Chart, lab work & pertinent test results, reviewed documented beta blocker date and time   History of Anesthesia Complications Negative for: history of anesthetic complications  Airway Mallampati: II TM Distance: >3 FB Neck ROM: Full    Dental  (+) Poor Dentition, Dental Advisory Given and Loose   Pulmonary former smoker (quit 4/14),  breath sounds clear to auscultation  Pulmonary exam normal       Cardiovascular negative cardio ROS  Rhythm:Regular Rate:Normal     Neuro/Psych negative neurological ROS     GI/Hepatic Neg liver ROS, Gastric cancer: chemo   Endo/Other  negative endocrine ROS  Renal/GU negative Renal ROS     Musculoskeletal   Abdominal   Peds  Hematology  (+) Blood dyscrasia (Hb 10.5), anemia ,   Anesthesia Other Findings   Reproductive/Obstetrics                         Anesthesia Physical Anesthesia Plan  ASA: III  Anesthesia Plan: General   Post-op Pain Management:    Induction: Intravenous  Airway Management Planned: Nasal ETT  Additional Equipment:   Intra-op Plan:   Post-operative Plan: Extubation in OR  Informed Consent: I have reviewed the patients History and Physical, chart, labs and discussed the procedure including the risks, benefits and alternatives for the proposed anesthesia with the patient or authorized representative who has indicated his/her understanding and acceptance.   Dental advisory given  Plan Discussed with: CRNA and Surgeon  Anesthesia Plan Comments: (Plan routine monitors, GETA with NT intubation )        Anesthesia Quick Evaluation

## 2012-11-12 ENCOUNTER — Other Ambulatory Visit: Payer: Self-pay | Admitting: Oncology

## 2012-11-14 ENCOUNTER — Encounter (HOSPITAL_COMMUNITY): Payer: Self-pay | Admitting: Dentistry

## 2012-11-15 ENCOUNTER — Telehealth: Payer: Self-pay | Admitting: Oncology

## 2012-11-15 ENCOUNTER — Telehealth: Payer: Self-pay | Admitting: *Deleted

## 2012-11-15 ENCOUNTER — Ambulatory Visit (HOSPITAL_BASED_OUTPATIENT_CLINIC_OR_DEPARTMENT_OTHER): Payer: BC Managed Care – PPO

## 2012-11-15 ENCOUNTER — Other Ambulatory Visit (HOSPITAL_BASED_OUTPATIENT_CLINIC_OR_DEPARTMENT_OTHER): Payer: BC Managed Care – PPO | Admitting: Lab

## 2012-11-15 ENCOUNTER — Ambulatory Visit (HOSPITAL_BASED_OUTPATIENT_CLINIC_OR_DEPARTMENT_OTHER): Payer: BC Managed Care – PPO | Admitting: Oncology

## 2012-11-15 VITALS — BP 111/67 | HR 75 | Temp 97.9°F | Resp 18 | Ht 63.0 in | Wt 137.0 lb

## 2012-11-15 DIAGNOSIS — C169 Malignant neoplasm of stomach, unspecified: Secondary | ICD-10-CM

## 2012-11-15 DIAGNOSIS — Z5111 Encounter for antineoplastic chemotherapy: Secondary | ICD-10-CM

## 2012-11-15 LAB — CBC WITH DIFFERENTIAL/PLATELET
Basophils Absolute: 0 10*3/uL (ref 0.0–0.1)
HCT: 33.3 % — ABNORMAL LOW (ref 38.4–49.9)
HGB: 10.7 g/dL — ABNORMAL LOW (ref 13.0–17.1)
LYMPH%: 41.2 % (ref 14.0–49.0)
MONO#: 0.6 10*3/uL (ref 0.1–0.9)
NEUT%: 38.4 % — ABNORMAL LOW (ref 39.0–75.0)
Platelets: 202 10*3/uL (ref 140–400)
WBC: 4.3 10*3/uL (ref 4.0–10.3)
lymph#: 1.8 10*3/uL (ref 0.9–3.3)

## 2012-11-15 LAB — COMPREHENSIVE METABOLIC PANEL (CC13)
Albumin: 3.6 g/dL (ref 3.5–5.0)
BUN: 8.8 mg/dL (ref 7.0–26.0)
CO2: 24 mEq/L (ref 22–29)
Calcium: 9 mg/dL (ref 8.4–10.4)
Chloride: 109 mEq/L (ref 98–109)
Creatinine: 0.7 mg/dL (ref 0.7–1.3)
Glucose: 94 mg/dl (ref 70–140)
Potassium: 4.2 mEq/L (ref 3.5–5.1)

## 2012-11-15 MED ORDER — PALONOSETRON HCL INJECTION 0.25 MG/5ML
0.2500 mg | Freq: Once | INTRAVENOUS | Status: AC
  Administered 2012-11-15: 0.25 mg via INTRAVENOUS

## 2012-11-15 MED ORDER — SODIUM CHLORIDE 0.9 % IJ SOLN
10.0000 mL | INTRAMUSCULAR | Status: DC | PRN
  Filled 2012-11-15: qty 10

## 2012-11-15 MED ORDER — COLD PACK MISC ONCOLOGY
1.0000 | Freq: Once | Status: DC | PRN
  Filled 2012-11-15: qty 1

## 2012-11-15 MED ORDER — HEPARIN SOD (PORK) LOCK FLUSH 100 UNIT/ML IV SOLN
500.0000 [IU] | Freq: Once | INTRAVENOUS | Status: DC | PRN
  Filled 2012-11-15: qty 5

## 2012-11-15 MED ORDER — DEXAMETHASONE SODIUM PHOSPHATE 10 MG/ML IJ SOLN
10.0000 mg | Freq: Once | INTRAMUSCULAR | Status: AC
  Administered 2012-11-15: 10 mg via INTRAVENOUS

## 2012-11-15 MED ORDER — HEPARIN SOD (PORK) LOCK FLUSH 100 UNIT/ML IV SOLN
250.0000 [IU] | Freq: Once | INTRAVENOUS | Status: DC | PRN
  Filled 2012-11-15: qty 5

## 2012-11-15 MED ORDER — HOT PACK MISC ONCOLOGY
1.0000 | Freq: Once | Status: DC | PRN
Start: 2012-11-15 — End: 2012-11-15
  Filled 2012-11-15: qty 1

## 2012-11-15 MED ORDER — ALTEPLASE 2 MG IJ SOLR
2.0000 mg | Freq: Once | INTRAMUSCULAR | Status: DC | PRN
  Filled 2012-11-15: qty 2

## 2012-11-15 MED ORDER — SODIUM CHLORIDE 0.9 % IJ SOLN
3.0000 mL | INTRAMUSCULAR | Status: DC | PRN
  Filled 2012-11-15: qty 10

## 2012-11-15 MED ORDER — FLUOROURACIL CHEMO INJECTION 2.5 GM/50ML
400.0000 mg/m2 | Freq: Once | INTRAVENOUS | Status: AC
  Administered 2012-11-15: 600 mg via INTRAVENOUS
  Filled 2012-11-15: qty 12

## 2012-11-15 MED ORDER — DEXTROSE 5 % IV SOLN
Freq: Once | INTRAVENOUS | Status: AC
  Administered 2012-11-15: 12:00:00 via INTRAVENOUS

## 2012-11-15 MED ORDER — OXALIPLATIN CHEMO INJECTION 100 MG/20ML
85.0000 mg/m2 | Freq: Once | INTRAVENOUS | Status: AC
  Administered 2012-11-15: 135 mg via INTRAVENOUS
  Filled 2012-11-15: qty 27

## 2012-11-15 MED ORDER — SODIUM CHLORIDE 0.9 % IV SOLN
2400.0000 mg/m2 | INTRAVENOUS | Status: DC
  Administered 2012-11-15: 3750 mg via INTRAVENOUS
  Filled 2012-11-15: qty 75

## 2012-11-15 MED ORDER — DEXTROSE 5 % IV SOLN
385.0000 mg/m2 | Freq: Once | INTRAVENOUS | Status: AC
  Administered 2012-11-15: 600 mg via INTRAVENOUS
  Filled 2012-11-15: qty 30

## 2012-11-15 NOTE — Progress Notes (Signed)
   [facility]    OFFICE PROGRESS NOTE   INTERVAL HISTORY:   He returns as scheduled. He underwent multiple tooth extractions on 11/10/2012. He continues to have mild discomfort at the left upper gum line and face. No nausea with the last cycle chemotherapy. No neuropathy symptoms. The skin rash has improved. Good appetite.  Objective:  Vital signs in last 24 hours:  Blood pressure 111/67, pulse 75, temperature 97.9 F (36.6 C), temperature source Oral, resp. rate 18, height 5\' 3"  (1.6 m), weight 137 lb (62.143 kg), SpO2 100.00%.    HEENT: No thrush, no bleeding, status post multiple tooth extractions Resp: Lungs clear bilaterally Cardio: Regular rate and rhythm GI: No hepatomegaly, no mass, no apparent ascites Vascular: No leg edema Neuro: The vibratory sense is intact at the fingertips bilaterally  Skin: Dry acne type rash over the chest and back   Portacath/PICC-without erythema  Lab Results:  Lab Results  Component Value Date   WBC 4.3 11/15/2012   HGB 10.7* 11/15/2012   HCT 33.3* 11/15/2012   MCV 81.6 11/15/2012   PLT 202 11/15/2012   ANC 1.7    Medications: I have reviewed the patient's current medications.  Assessment/Plan: 1.MetastaticGastric cancer-status post biopsy of a gastric mass on 07/20/2012 with the pathology confirming adenocarcinoma , HER-2 non-amplified  - CT 07/18/2012 consistent with a primary gastric mass and metastatic omental/peritoneal nodules  -biopsy of a peritoneal nodule 07/31/2012 consistent with metastatic adenocarcinoma  -Cycle 1 of FOLFOX on 08/02/2012  -Restaging CT 10/23/2012 consistent with a partial response-decrease in the primary gastric tumor and omental/peritoneal implants  3. Multiple colon polyps documented on a colonoscopy 06/22/2012-tubular adenomas, tubulovillous adenomas, and hyperplastic polyps  4. Family history of colon cancer (mother)  5. Pain secondary to the primary gastric mass or carcinomatosis  -improved  6. 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 Kristin Bruins, multiple extractions are recommended  7. Anorexia, nausea, and early satiety-improved  8. 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 Nausea following chemotherapy-improved with the addition of prophylactic Decadron  10. Skin rash-most likely secondary to Decadron, he will not take Decadron following this cycle 11. Neutropenia second chemotherapy on 07/24/2012-chemotherapy was held until 11/01/2012 12. Presentation with clinical evidence of a dental abscess on 11/03/2012, placed on antibiotics, status post multiple tooth extractions on 11/10/2012   Disposition:  He has completed 6 cycles of FOLFOX. He continues to tolerate chemotherapy well. The plan is to proceed with cycle 7 today. He will return for an office visit and chemotherapy in 2 weeks. He appears to have recovered from the tooth extraction procedure. We will hold the prophylactic Decadron after this cycle since this may be causing the skin rash. He will contact us for nausea.   Thornton Papas, MD  11/15/2012  3:40 PM

## 2012-11-15 NOTE — Telephone Encounter (Signed)
Talked to pt and he will come friday to get another appt calendar for September 2014

## 2012-11-15 NOTE — Telephone Encounter (Signed)
Gave pt appt for lab and MD  on September , emailed Marcelino Duster regarding chemo

## 2012-11-15 NOTE — Patient Instructions (Addendum)
[facility] Discharge Instructions for Patients Receiving Chemotherapy  Today you received the following chemotherapy agents FOLFOX.   To help prevent nausea and vomiting after your treatment, we encourage you to take your nausea medication as directed.    If you develop nausea and vomiting that is not controlled by your nausea medication, call the clinic.   BELOW ARE SYMPTOMS THAT SHOULD BE REPORTED IMMEDIATELY:  *FEVER GREATER THAN 100.5 F  *CHILLS WITH OR WITHOUT FEVER  NAUSEA AND VOMITING THAT IS NOT CONTROLLED WITH YOUR NAUSEA MEDICATION  *UNUSUAL SHORTNESS OF BREATH  *UNUSUAL BRUISING OR BLEEDING  TENDERNESS IN MOUTH AND THROAT WITH OR WITHOUT PRESENCE OF ULCERS  *URINARY PROBLEMS  *BOWEL PROBLEMS  UNUSUAL RASH Items with * indicate a potential emergency and should be followed up as soon as possible.  Feel free to call the clinic you have any questions or concerns. The clinic phone number is [phone].    

## 2012-11-15 NOTE — Telephone Encounter (Signed)
Per staff message and POF I have scheduled appts.  JMW  

## 2012-11-17 ENCOUNTER — Ambulatory Visit (HOSPITAL_BASED_OUTPATIENT_CLINIC_OR_DEPARTMENT_OTHER): Payer: Self-pay

## 2012-11-17 VITALS — BP 125/69 | HR 76 | Temp 97.5°F

## 2012-11-17 DIAGNOSIS — C169 Malignant neoplasm of stomach, unspecified: Secondary | ICD-10-CM

## 2012-11-17 MED ORDER — HEPARIN SOD (PORK) LOCK FLUSH 100 UNIT/ML IV SOLN
500.0000 [IU] | Freq: Once | INTRAVENOUS | Status: AC | PRN
  Administered 2012-11-17: 500 [IU]
  Filled 2012-11-17: qty 5

## 2012-11-17 MED ORDER — SODIUM CHLORIDE 0.9 % IJ SOLN
10.0000 mL | INTRAMUSCULAR | Status: DC | PRN
  Administered 2012-11-17: 10 mL
  Filled 2012-11-17: qty 10

## 2012-11-21 ENCOUNTER — Telehealth (HOSPITAL_COMMUNITY): Payer: Self-pay | Admitting: Dentistry

## 2012-11-21 ENCOUNTER — Ambulatory Visit (HOSPITAL_COMMUNITY): Payer: Self-pay | Admitting: Dentistry

## 2012-11-21 NOTE — Telephone Encounter (Signed)
11/21/2012  Patient:            Joseph Salinas Date of Birth:  1960/09/26 MRN:                [id number]  Broken appointment. Wife was contacted by telephone and indicated that she forgot about the appointment for her husband. Patient was rescheduled to 11/22/2012 at 9 AM for followup evaluation and suture removal. Charlynne Pander, DDS

## 2012-11-22 ENCOUNTER — Encounter (HOSPITAL_COMMUNITY): Payer: Self-pay | Admitting: Dentistry

## 2012-11-22 ENCOUNTER — Ambulatory Visit (HOSPITAL_COMMUNITY): Payer: Self-pay | Admitting: Dentistry

## 2012-11-22 VITALS — BP 108/69 | HR 73 | Temp 98.3°F

## 2012-11-22 DIAGNOSIS — K08409 Partial loss of teeth, unspecified cause, unspecified class: Secondary | ICD-10-CM

## 2012-11-22 DIAGNOSIS — C169 Malignant neoplasm of stomach, unspecified: Secondary | ICD-10-CM

## 2012-11-22 DIAGNOSIS — K0530 Chronic periodontitis, unspecified: Secondary | ICD-10-CM

## 2012-11-22 DIAGNOSIS — K029 Dental caries, unspecified: Secondary | ICD-10-CM

## 2012-11-22 NOTE — Patient Instructions (Addendum)
PLAN: 1. Continue salt water rinses every 2 hours while awake to aid healing 2. Use chlorhexidine rinses as prescribed 3. Return to clinic in one week for restoration of tooth numbers 4 and 5 4. Patient is to followup with a primary dentist of his choice for exam, continued periodontal care, and discussion of other treatment options as indicated once medically stable from the chemotherapy and gastric cancer. 5. Call if problems arise with healing.  Charlynne Pander, DDS

## 2012-11-22 NOTE — Progress Notes (Signed)
POST OPERATIVE NOTE:  11/22/2012 Joseph Salinas [id number]  VITALS: BP 108/69  Pulse 73  Temp(Src) 98.3 F (36.8 C) (Oral)  Joseph Salinas  is status post multiple extractions with alveoloplasty and pre-prosthetic surgery as indicated along with gross debridement of remaining dentition the operating room on 11/11/2002.  SUBJECTIVE: Patient with minimal complaints from the dental extractions. Patient indicates that he has a few stitches that remain. Patient is rinsing with the chlorhexidine and the salt water rinses as instructed.  EXAM: No sign of infection, heme, or ooze. A few sutures are loosely intact. Generalized primary closure is noted. Dental caries of tooth numbers 4 and 5 are noted. Patient now with multiple missing teeth.  ASSESSMENT: Post operative course is consistent with dental procedures performed in the operating room. Dental caries affecting tooth numbers 4 and 5  PLAN: 1. Continue salt water rinses every 2 hours while awake to aid healing 2. Use chlorhexidine rinses as prescribed 3. Return to clinic in one week for restoration of tooth numbers 4 and 5 4. Patient is to followup with a primary dentist of his choice for exam, continued periodontal care, and discussion of other treatment options as indicated once medically stable from the chemotherapy and gastric cancer. 5. Call if problems arise with healing.  Charlynne Pander, DDS

## 2012-11-26 ENCOUNTER — Other Ambulatory Visit: Payer: Self-pay | Admitting: Oncology

## 2012-11-28 ENCOUNTER — Telehealth: Payer: Self-pay | Admitting: *Deleted

## 2012-11-28 ENCOUNTER — Encounter: Payer: Self-pay | Admitting: Oncology

## 2012-11-28 ENCOUNTER — Ambulatory Visit (HOSPITAL_COMMUNITY): Payer: Self-pay | Admitting: Dentistry

## 2012-11-28 ENCOUNTER — Encounter (HOSPITAL_COMMUNITY): Payer: Self-pay | Admitting: Dentistry

## 2012-11-28 VITALS — BP 114/73 | HR 68 | Temp 97.8°F

## 2012-11-28 DIAGNOSIS — K029 Dental caries, unspecified: Secondary | ICD-10-CM

## 2012-11-28 DIAGNOSIS — C169 Malignant neoplasm of stomach, unspecified: Secondary | ICD-10-CM

## 2012-11-28 NOTE — Progress Notes (Signed)
Patient and wife came in office upset. The [employer] was cancelled. She said [employer] said she called and cancelled? She would not with him in treatment....... I called [employer] and long hold, so I told her I would call them back and call her and let her know what I found out. She said she pays 179 per month for it and no way would cancel. They still have our discount until nov.

## 2012-11-28 NOTE — Telephone Encounter (Signed)
Patient's wife contacted this RN.  She was very upset over being told by [facility]  that her husband has lost insurance even though she has continued to pay on schedule.  She was very upset.  This RN offered emotional support and connected her with FC,  who was very accommodating.  This RN also contacted SW, who saw patient.  SW and FC working on assisting patient with this issue.  Patient and wife very appreciative.  They verbalized understanding to contact this RN with any barriers to care.

## 2012-11-28 NOTE — Progress Notes (Signed)
11/28/2012  Patient:            Joseph Salinas Date of Birth:  07-09-1960 MRN:                [id number]  BP 114/73  Pulse 68  Temp(Src) 97.8 F (36.6 C) (Oral)  [name] presents for dental restorations.  Procedure: 36 mg lidocaine with .018 epi via infiltration 4-5 area. W0981 #4 MO/[employer] Gluma, Tytin alloy. Burnish. X9147 #5 DO/[employer] Gluma, Tytin alloy. Burnish. Occlusion checked and is acceptable. Patient tolerated procedure well.  Patient to contact dental medicine if acute problems arise in the future while undergoing active chemotherapy. After chemotherapy, the patient is to followup with a dentist of his choice for exam, x-rays, and discussion of overall dental treatment needs. Patient is aware of the need for additional periodontal therapy as well as evaluation for replacement of missing teeth.  Charlynne Pander, DDS

## 2012-11-28 NOTE — Progress Notes (Signed)
Called and spoke with [employer] and they can't tell me why termed. I called her back to let her know she needs to call them again.. She is so upset and frustrated. I asked her to give someone that speaks Spanish to make sure they understand what she is asking. I called and left a message again for [facility] again [phone] about w-9 and paying 180.00 for their bill and that they want to be refunded from 180.00 for work done on AC--they needed with him being patient.

## 2012-11-28 NOTE — Patient Instructions (Signed)
Patient to contact dental medicine if acute problems arise in the future while undergoing active chemotherapy. After chemotherapy, the patient is to followup with a dentist of his choice for exam, x-rays, and discussion of overall dental treatment needs. Patient is aware of the need for additional periodontal therapy as well as evaluation for replacement of missing teeth.   The patient is to avoid contact on tooth numbers 4 and 5 for 24 hours. Patient to use Tylenol as needed for dental discomfort.  Charlynne Pander DDS

## 2012-11-29 ENCOUNTER — Ambulatory Visit (HOSPITAL_BASED_OUTPATIENT_CLINIC_OR_DEPARTMENT_OTHER): Payer: Self-pay | Admitting: Oncology

## 2012-11-29 ENCOUNTER — Telehealth: Payer: Self-pay | Admitting: Oncology

## 2012-11-29 ENCOUNTER — Other Ambulatory Visit (HOSPITAL_BASED_OUTPATIENT_CLINIC_OR_DEPARTMENT_OTHER): Payer: Self-pay

## 2012-11-29 ENCOUNTER — Ambulatory Visit (HOSPITAL_BASED_OUTPATIENT_CLINIC_OR_DEPARTMENT_OTHER): Payer: Self-pay

## 2012-11-29 VITALS — BP 118/76 | HR 76 | Temp 97.0°F | Resp 18 | Ht 63.0 in | Wt 143.2 lb

## 2012-11-29 DIAGNOSIS — C169 Malignant neoplasm of stomach, unspecified: Secondary | ICD-10-CM

## 2012-11-29 DIAGNOSIS — C786 Secondary malignant neoplasm of retroperitoneum and peritoneum: Secondary | ICD-10-CM

## 2012-11-29 DIAGNOSIS — R21 Rash and other nonspecific skin eruption: Secondary | ICD-10-CM

## 2012-11-29 DIAGNOSIS — Z5111 Encounter for antineoplastic chemotherapy: Secondary | ICD-10-CM

## 2012-11-29 DIAGNOSIS — R110 Nausea: Secondary | ICD-10-CM

## 2012-11-29 DIAGNOSIS — Z800 Family history of malignant neoplasm of digestive organs: Secondary | ICD-10-CM

## 2012-11-29 LAB — COMPREHENSIVE METABOLIC PANEL (CC13)
BUN: 9.5 mg/dL (ref 7.0–26.0)
CO2: 27 mEq/L (ref 22–29)
Glucose: 107 mg/dl (ref 70–140)
Sodium: 140 mEq/L (ref 136–145)
Total Bilirubin: 0.4 mg/dL (ref 0.20–1.20)
Total Protein: 6.9 g/dL (ref 6.4–8.3)

## 2012-11-29 LAB — CBC WITH DIFFERENTIAL/PLATELET
Basophils Absolute: 0.1 10*3/uL (ref 0.0–0.1)
Eosinophils Absolute: 0.1 10*3/uL (ref 0.0–0.5)
HCT: 34 % — ABNORMAL LOW (ref 38.4–49.9)
HGB: 10.8 g/dL — ABNORMAL LOW (ref 13.0–17.1)
LYMPH%: 43.2 % (ref 14.0–49.0)
MCH: 26 pg — ABNORMAL LOW (ref 27.2–33.4)
MCV: 81.9 fL (ref 79.3–98.0)
MONO%: 11.8 % (ref 0.0–14.0)
NEUT#: 1.5 10*3/uL (ref 1.5–6.5)
NEUT%: 39.2 % (ref 39.0–75.0)
Platelets: 145 10*3/uL (ref 140–400)
RDW: 18.2 % — ABNORMAL HIGH (ref 11.0–14.6)

## 2012-11-29 MED ORDER — PALONOSETRON HCL INJECTION 0.25 MG/5ML
INTRAVENOUS | Status: AC
  Filled 2012-11-29: qty 5

## 2012-11-29 MED ORDER — FLUOROURACIL CHEMO INJECTION 2.5 GM/50ML
400.0000 mg/m2 | Freq: Once | INTRAVENOUS | Status: AC
  Administered 2012-11-29: 600 mg via INTRAVENOUS
  Filled 2012-11-29: qty 12

## 2012-11-29 MED ORDER — DEXTROSE 5 % IV SOLN
Freq: Once | INTRAVENOUS | Status: AC
  Administered 2012-11-29: 12:00:00 via INTRAVENOUS

## 2012-11-29 MED ORDER — SODIUM CHLORIDE 0.9 % IJ SOLN
10.0000 mL | INTRAMUSCULAR | Status: DC | PRN
  Filled 2012-11-29: qty 10

## 2012-11-29 MED ORDER — LEUCOVORIN CALCIUM INJECTION 350 MG
385.0000 mg/m2 | Freq: Once | INTRAVENOUS | Status: AC
  Administered 2012-11-29: 600 mg via INTRAVENOUS
  Filled 2012-11-29: qty 30

## 2012-11-29 MED ORDER — DEXAMETHASONE SODIUM PHOSPHATE 10 MG/ML IJ SOLN
INTRAMUSCULAR | Status: AC
  Administered 2012-11-29: 10 mg
  Filled 2012-11-29: qty 1

## 2012-11-29 MED ORDER — SODIUM CHLORIDE 0.9 % IV SOLN
INTRAVENOUS | Status: DC
  Administered 2012-11-29: 11:00:00 via INTRAVENOUS

## 2012-11-29 MED ORDER — HEPARIN SOD (PORK) LOCK FLUSH 100 UNIT/ML IV SOLN
500.0000 [IU] | Freq: Once | INTRAVENOUS | Status: DC | PRN
  Filled 2012-11-29: qty 5

## 2012-11-29 MED ORDER — PALONOSETRON HCL INJECTION 0.25 MG/5ML
0.2500 mg | Freq: Once | INTRAVENOUS | Status: AC
  Administered 2012-11-29: 0.25 mg via INTRAVENOUS

## 2012-11-29 MED ORDER — OXALIPLATIN CHEMO INJECTION 100 MG/20ML
85.0000 mg/m2 | Freq: Once | INTRAVENOUS | Status: AC
  Administered 2012-11-29: 135 mg via INTRAVENOUS
  Filled 2012-11-29: qty 27

## 2012-11-29 MED ORDER — SODIUM CHLORIDE 0.9 % IV SOLN
2400.0000 mg/m2 | INTRAVENOUS | Status: DC
  Administered 2012-11-29: 3750 mg via INTRAVENOUS
  Filled 2012-11-29: qty 75

## 2012-11-29 MED ORDER — DEXAMETHASONE SODIUM PHOSPHATE 10 MG/ML IJ SOLN: 10.0000 mg | Freq: Once | INTRAMUSCULAR | Status: DC

## 2012-11-29 NOTE — Progress Notes (Signed)
   [facility]    OFFICE PROGRESS NOTE   INTERVAL HISTORY:   He returns as scheduled. He completed another cycle of FOLFOX on 11/15/2012. He did not take Decadron following this cycle. The skin rash has improved. No nausea/vomiting, mouth sores, or diarrhea. He had cold sensitivity following chemotherapy. No neuropathy symptoms at present. He saw Dr. Kristin Bruins yesterday. Objective:  Vital signs in last 24 hours:  Blood pressure 118/76, pulse 76, temperature 97 F (36.1 C), temperature source Oral, resp. rate 18, height 5\' 3"  (1.6 m), weight 143 lb 3.2 oz (64.955 kg).    HEENT: No thrush or ulcers Resp: Lungs clear bilaterally Cardio: Regular rate and rhythm GI: No hepatomegaly, no apparent ascites, nontender, no mass Vascular: No leg edema Neuro: The vibratory sense is intact at the fingertips  Skin: Resolving Manson Passey raised lesions over the trunk   Portacath/PICC-without erythema  Lab Results:  Lab Results  Component Value Date   WBC 3.8* 11/29/2012   HGB 10.8* 11/29/2012   HCT 34.0* 11/29/2012   MCV 81.9 11/29/2012   PLT 145 11/29/2012   ANC 1.5   Medications: I have reviewed the patient's current medications.  Assessment/Plan: 1.MetastaticGastric cancer-status post biopsy of a gastric mass on 07/20/2012 with the pathology confirming adenocarcinoma , HER-2 non-amplified  - CT 07/18/2012 consistent with a primary gastric mass and metastatic omental/peritoneal nodules  -biopsy of a peritoneal nodule 07/31/2012 consistent with metastatic adenocarcinoma  -Cycle 1 of FOLFOX on 08/02/2012  -Restaging CT 10/23/2012 consistent with a partial response-decrease in the primary gastric tumor and omental/peritoneal implants  3. Multiple colon polyps documented on a colonoscopy 06/22/2012-tubular adenomas, tubulovillous adenomas, and hyperplastic polyps  4. Family history of colon cancer (mother)  5. Pain secondary to the primary gastric mass or carcinomatosis -improved   6. 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 Kristin Bruins, multiple extractions were recommended  7. Anorexia, nausea, and early satiety-improved  8. 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 Nausea following chemotherapy-improved with the addition of prophylactic Decadron  10. Skin rash-most likely secondary to Decadron, improved 11. Neutropenia second chemotherapy on 07/24/2012-chemotherapy was held until 11/01/2012  12. Presentation with clinical evidence of a dental abscess on 11/03/2012, placed on antibiotics, status post multiple tooth extractions on 11/10/2012    Disposition:  He has completed 7 cycles of FOLFOX. He continues tolerated chemotherapy well. The skin rash has improved after discontinuing home Decadron. The plan is to proceed with cycle 8 of FOLFOX today. He will return for an office visit and chemotherapy in 2 weeks. We will followup on the CEA from today. The plan is to complete 10 cycles of FOLFOX and then switch to maintenance capecitabine.     Thornton Papas, MD  11/29/2012  12:38 PM

## 2012-11-29 NOTE — Telephone Encounter (Signed)
gv pt appt schedule for September and October.  °

## 2012-11-30 LAB — CEA: CEA: 3.3 ng/mL (ref 0.0–5.0)

## 2012-12-01 ENCOUNTER — Ambulatory Visit (HOSPITAL_BASED_OUTPATIENT_CLINIC_OR_DEPARTMENT_OTHER): Payer: Self-pay

## 2012-12-01 VITALS — BP 119/71 | HR 67 | Temp 98.0°F

## 2012-12-01 DIAGNOSIS — C169 Malignant neoplasm of stomach, unspecified: Secondary | ICD-10-CM

## 2012-12-01 MED ORDER — HEPARIN SOD (PORK) LOCK FLUSH 100 UNIT/ML IV SOLN
500.0000 [IU] | Freq: Once | INTRAVENOUS | Status: AC | PRN
  Administered 2012-12-01: 500 [IU]
  Filled 2012-12-01: qty 5

## 2012-12-01 MED ORDER — SODIUM CHLORIDE 0.9 % IJ SOLN
10.0000 mL | INTRAMUSCULAR | Status: DC | PRN
  Administered 2012-12-01: 10 mL
  Filled 2012-12-01: qty 10

## 2012-12-10 ENCOUNTER — Other Ambulatory Visit: Payer: Self-pay | Admitting: Oncology

## 2012-12-13 ENCOUNTER — Encounter: Payer: Self-pay | Admitting: Oncology

## 2012-12-13 ENCOUNTER — Telehealth: Payer: Self-pay | Admitting: Oncology

## 2012-12-13 ENCOUNTER — Ambulatory Visit (HOSPITAL_BASED_OUTPATIENT_CLINIC_OR_DEPARTMENT_OTHER): Payer: Self-pay

## 2012-12-13 ENCOUNTER — Ambulatory Visit (HOSPITAL_BASED_OUTPATIENT_CLINIC_OR_DEPARTMENT_OTHER): Payer: Self-pay | Admitting: Oncology

## 2012-12-13 ENCOUNTER — Other Ambulatory Visit: Payer: Self-pay | Admitting: Lab

## 2012-12-13 VITALS — BP 112/67 | HR 65 | Temp 97.2°F | Resp 18 | Ht 63.0 in | Wt 150.1 lb

## 2012-12-13 DIAGNOSIS — C169 Malignant neoplasm of stomach, unspecified: Secondary | ICD-10-CM

## 2012-12-13 DIAGNOSIS — Z5111 Encounter for antineoplastic chemotherapy: Secondary | ICD-10-CM

## 2012-12-13 DIAGNOSIS — C786 Secondary malignant neoplasm of retroperitoneum and peritoneum: Secondary | ICD-10-CM

## 2012-12-13 LAB — COMPREHENSIVE METABOLIC PANEL (CC13)
ALT: 21 U/L (ref 0–55)
AST: 24 U/L (ref 5–34)
Albumin: 3.3 g/dL — ABNORMAL LOW (ref 3.5–5.0)
Alkaline Phosphatase: 84 U/L (ref 40–150)
Glucose: 102 mg/dl (ref 70–140)
Potassium: 4.1 mEq/L (ref 3.5–5.1)
Sodium: 141 mEq/L (ref 136–145)
Total Bilirubin: 0.45 mg/dL (ref 0.20–1.20)
Total Protein: 6.9 g/dL (ref 6.4–8.3)

## 2012-12-13 LAB — CBC WITH DIFFERENTIAL/PLATELET
BASO%: 1.4 % (ref 0.0–2.0)
EOS%: 3.9 % (ref 0.0–7.0)
Eosinophils Absolute: 0.1 10*3/uL (ref 0.0–0.5)
LYMPH%: 38.7 % (ref 14.0–49.0)
MCHC: 31.4 g/dL — ABNORMAL LOW (ref 32.0–36.0)
MCV: 82.2 fL (ref 79.3–98.0)
MONO%: 13.3 % (ref 0.0–14.0)
NEUT#: 1.6 10*3/uL (ref 1.5–6.5)
Platelets: 121 10*3/uL — ABNORMAL LOW (ref 140–400)
RBC: 3.99 10*6/uL — ABNORMAL LOW (ref 4.20–5.82)
RDW: 18.5 % — ABNORMAL HIGH (ref 11.0–14.6)

## 2012-12-13 MED ORDER — PALONOSETRON HCL INJECTION 0.25 MG/5ML
INTRAVENOUS | Status: AC
  Filled 2012-12-13: qty 5

## 2012-12-13 MED ORDER — OXALIPLATIN CHEMO INJECTION 100 MG/20ML
85.0000 mg/m2 | Freq: Once | INTRAVENOUS | Status: AC
  Administered 2012-12-13: 135 mg via INTRAVENOUS
  Filled 2012-12-13: qty 27

## 2012-12-13 MED ORDER — LEUCOVORIN CALCIUM INJECTION 350 MG
385.0000 mg/m2 | Freq: Once | INTRAVENOUS | Status: AC
  Administered 2012-12-13: 600 mg via INTRAVENOUS
  Filled 2012-12-13: qty 30

## 2012-12-13 MED ORDER — DEXAMETHASONE SODIUM PHOSPHATE 10 MG/ML IJ SOLN
INTRAMUSCULAR | Status: AC
  Filled 2012-12-13: qty 1

## 2012-12-13 MED ORDER — DEXTROSE 5 % IV SOLN
Freq: Once | INTRAVENOUS | Status: AC
  Administered 2012-12-13: 13:00:00 via INTRAVENOUS

## 2012-12-13 MED ORDER — FLUOROURACIL CHEMO INJECTION 2.5 GM/50ML
400.0000 mg/m2 | Freq: Once | INTRAVENOUS | Status: AC
  Administered 2012-12-13: 600 mg via INTRAVENOUS
  Filled 2012-12-13: qty 12

## 2012-12-13 MED ORDER — PALONOSETRON HCL INJECTION 0.25 MG/5ML
0.2500 mg | Freq: Once | INTRAVENOUS | Status: AC
  Administered 2012-12-13: 0.25 mg via INTRAVENOUS

## 2012-12-13 MED ORDER — DEXAMETHASONE SODIUM PHOSPHATE 10 MG/ML IJ SOLN
10.0000 mg | Freq: Once | INTRAMUSCULAR | Status: AC
  Administered 2012-12-13: 10 mg via INTRAVENOUS

## 2012-12-13 MED ORDER — SODIUM CHLORIDE 0.9 % IV SOLN
2400.0000 mg/m2 | INTRAVENOUS | Status: DC
  Administered 2012-12-13: 3750 mg via INTRAVENOUS
  Filled 2012-12-13: qty 75

## 2012-12-13 NOTE — Progress Notes (Signed)
Met with patient and wife.  They continue to feel frustration re: need for assistance with having no insurance or income.  They are afraid they are going to lose their house.  This RN offered emotional support and asked that SW meet with patient while here today.

## 2012-12-13 NOTE — Progress Notes (Signed)
   [facility]    OFFICE PROGRESS NOTE   INTERVAL HISTORY:   He returns for scheduled followup of gastric cancer. He completed another cycle of FOLFOX on 11/29/2012. No nausea following chemotherapy. Cold sensitivity lasted for a few days. No neuropathy symptoms at present. Good appetite. No pain.  Objective:  Vital signs in last 24 hours:  Blood pressure 112/67, pulse 65, temperature 97.2 F (36.2 C), resp. rate 18, height 5\' 3"  (1.6 m), weight 150 lb 1.6 oz (68.085 kg).    HEENT: No thrush or ulcers Resp: Lungs clear bilaterally Cardio: Regular rate and rhythm GI: No hepatomegaly, nontender, no mass, no apparent ascites Vascular: No leg edema Neuro: The vibratory sense is intact at the fingertips bilaterally  Skin: Dry hyperpigmented papules over the trunk   Portacath/PICC-without erythema  Lab Results:  Lab Results  Component Value Date   WBC 3.6* 12/13/2012   HGB 10.3* 12/13/2012   HCT 32.8* 12/13/2012   MCV 82.2 12/13/2012   PLT 121* 12/13/2012   ANC 1.6  CEA on 11/29/2012-3.3  Medications: I have reviewed the patient's current medications.  Assessment/Plan: 1.MetastaticGastric cancer-status post biopsy of a gastric mass on 07/20/2012 with the pathology confirming adenocarcinoma , HER-2 non-amplified  - CT 07/18/2012 consistent with a primary gastric mass and metastatic omental/peritoneal nodules  -biopsy of a peritoneal nodule 07/31/2012 consistent with metastatic adenocarcinoma  -Cycle 1 of FOLFOX on 08/02/2012  -Restaging CT 10/23/2012 consistent with a partial response-decrease in the primary gastric tumor and omental/peritoneal implants  3. Multiple colon polyps documented on a colonoscopy 06/22/2012-tubular adenomas, tubulovillous adenomas, and hyperplastic polyps  4. Family history of colon cancer (mother)  5. Pain secondary to the primary gastric mass or carcinomatosis -improved  6. 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 Kristin Bruins, multiple extractions were recommended  7. Anorexia, nausea, and early satiety-improved  8. 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 Nausea following chemotherapy-improved with the addition of prophylactic Decadron  10. Skin rash-most likely secondary to Decadron, improved  11. Neutropenia second chemotherapy on 07/24/2012-chemotherapy was held until 11/01/2012  12. Presentation with clinical evidence of a dental abscess on 11/03/2012, placed on antibiotics, status post multiple tooth extractions on 11/10/2012    Disposition:  He has completed 8 cycles of FOLFOX. He continues to tolerate chemotherapy well. The plan is to proceed with cycle 9 today. We will switch to maintenance capecitabine after 10 cycles of FOLFOX. Mr. Sahr will return for an office visit and the next cycle of chemotherapy on 01/02/2013.   Thornton Papas, MD  12/13/2012  11:38 AM

## 2012-12-13 NOTE — Progress Notes (Signed)
Fluorouracil,, oxaliplatin and leucovorin are not replaceable drugs.

## 2012-12-13 NOTE — Telephone Encounter (Signed)
gv pt appt schedule for September and October.  °

## 2012-12-13 NOTE — Patient Instructions (Addendum)
[facility] Discharge Instructions for Patients Receiving Chemotherapy  Today you received the following chemotherapy agents Oxaliplatin, Leucovorin and Adrucil.  To help prevent nausea and vomiting after your treatment, we encourage you to take your nausea medication as prescribed.   If you develop nausea and vomiting that is not controlled by your nausea medication, call the clinic.   BELOW ARE SYMPTOMS THAT SHOULD BE REPORTED IMMEDIATELY:  *FEVER GREATER THAN 100.5 F  *CHILLS WITH OR WITHOUT FEVER  NAUSEA AND VOMITING THAT IS NOT CONTROLLED WITH YOUR NAUSEA MEDICATION  *UNUSUAL SHORTNESS OF BREATH  *UNUSUAL BRUISING OR BLEEDING  TENDERNESS IN MOUTH AND THROAT WITH OR WITHOUT PRESENCE OF ULCERS  *URINARY PROBLEMS  *BOWEL PROBLEMS  UNUSUAL RASH Items with * indicate a potential emergency and should be followed up as soon as possible.  Feel free to call the clinic you have any questions or concerns. The clinic phone number is [phone].    

## 2012-12-13 NOTE — Progress Notes (Signed)
Patient and wife need asst from Child psychotherapist. He has 200.00 left for [facility] funds and that is for his meds. They were in office a few weeks ago and said that the insurance was cancelled in error. I advised her she needed to call them and see why was it done. If he is not needing anymore meds we could use some of the rem 200.00 if need be. I left message for Remonia Richter to call them on 12/14/12 to advise what other asst is available for them.

## 2012-12-13 NOTE — Progress Notes (Unsigned)
Oxaliplatin, leucovorin and

## 2012-12-14 ENCOUNTER — Encounter: Payer: Self-pay | Admitting: *Deleted

## 2012-12-14 NOTE — Progress Notes (Signed)
[facility] Clinical Social Work  Clinical Social Work was referred by Technical sales engineer for assessment of psychosocial needs due to financial concerns.  Clinical Social Worker contacted patient's wife at home to offer support and assess for needs.    Wife tearful as she described many financial concerns. CSW attempted to problem solve with the wife and discussed many community agencies that may assist. Wife responded to each resource that CSW suggested "they no help me." Pt is receiving SSD and this is helpful. CSW suggested family to reapply for medicaid as they may qualify as they are receiving SSD currently. From discussion with the wife, there may not have been follow through from family on prior suggestions for assistance/resources.    CSW attempting to assist this family, but currently does not have anything else to offer them that has not already been suggested to them. CSW will try to meet with family at Pt's next appointment.       Doreen Salvage, LCSW Clinical Social Worker [facility] Wednesday, Thursday and Friday Phone: [phone] Fax: [phone]

## 2012-12-15 ENCOUNTER — Ambulatory Visit (HOSPITAL_BASED_OUTPATIENT_CLINIC_OR_DEPARTMENT_OTHER): Payer: Self-pay

## 2012-12-15 VITALS — BP 132/72 | HR 80 | Temp 98.5°F

## 2012-12-15 DIAGNOSIS — C169 Malignant neoplasm of stomach, unspecified: Secondary | ICD-10-CM

## 2012-12-15 MED ORDER — SODIUM CHLORIDE 0.9 % IJ SOLN
10.0000 mL | INTRAMUSCULAR | Status: DC | PRN
  Administered 2012-12-15: 10 mL
  Filled 2012-12-15: qty 10

## 2012-12-15 MED ORDER — HEPARIN SOD (PORK) LOCK FLUSH 100 UNIT/ML IV SOLN
500.0000 [IU] | Freq: Once | INTRAVENOUS | Status: AC | PRN
  Administered 2012-12-15: 500 [IU]
  Filled 2012-12-15: qty 5

## 2012-12-15 NOTE — Progress Notes (Signed)
Error

## 2012-12-20 ENCOUNTER — Encounter: Payer: Self-pay | Admitting: *Deleted

## 2012-12-20 NOTE — Progress Notes (Signed)
[facility] Clinical Social Work  Clinical Social Work called wife in attempt to follow up on some of their many financial concerns. Wife very appreciative and received a phone call from [employer] insurance rep this am who is acting on her behalf to appeal the insurance decision. She is waiting on the outcome, but it appears this insurance decision may be appealed.   Per wife, she is able to continue working and most of their immediate needs are being met. She reports they are now four months behind on their mortgage, but she is working with the bank and sent them paperwork last week. She denies any food concerns currently. She agrees to contact CSW if any assistance is needed on these concerns. Pt and wife are currently waiting on outcomes/updates on these two concerns.   CSW will meet with Pt and wife at his appt on 10/14.  Doreen Salvage, LCSW Clinical Social Worker [facility] Wednesday, Thursday and Friday Phone: [phone] Fax: [phone]

## 2012-12-31 ENCOUNTER — Other Ambulatory Visit: Payer: Self-pay | Admitting: Oncology

## 2013-01-02 ENCOUNTER — Telehealth: Payer: Self-pay | Admitting: Oncology

## 2013-01-02 ENCOUNTER — Ambulatory Visit: Payer: Medicaid Other

## 2013-01-02 ENCOUNTER — Other Ambulatory Visit (HOSPITAL_BASED_OUTPATIENT_CLINIC_OR_DEPARTMENT_OTHER): Payer: Medicaid Other | Admitting: Lab

## 2013-01-02 ENCOUNTER — Ambulatory Visit (HOSPITAL_BASED_OUTPATIENT_CLINIC_OR_DEPARTMENT_OTHER): Payer: Medicaid Other | Admitting: Oncology

## 2013-01-02 VITALS — BP 134/72 | HR 69 | Temp 98.0°F | Resp 20 | Ht 63.0 in | Wt 154.1 lb

## 2013-01-02 DIAGNOSIS — C169 Malignant neoplasm of stomach, unspecified: Secondary | ICD-10-CM

## 2013-01-02 DIAGNOSIS — C786 Secondary malignant neoplasm of retroperitoneum and peritoneum: Secondary | ICD-10-CM

## 2013-01-02 DIAGNOSIS — G893 Neoplasm related pain (acute) (chronic): Secondary | ICD-10-CM

## 2013-01-02 DIAGNOSIS — D702 Other drug-induced agranulocytosis: Secondary | ICD-10-CM

## 2013-01-02 LAB — CBC WITH DIFFERENTIAL/PLATELET
BASO%: 3.1 % — ABNORMAL HIGH (ref 0.0–2.0)
HCT: 31.1 % — ABNORMAL LOW (ref 38.4–49.9)
MCHC: 31.2 g/dL — ABNORMAL LOW (ref 32.0–36.0)
MONO#: 0.5 10*3/uL (ref 0.1–0.9)
MONO%: 16.4 % — ABNORMAL HIGH (ref 0.0–14.0)
NEUT%: 26.9 % — ABNORMAL LOW (ref 39.0–75.0)
WBC: 3.2 10*3/uL — ABNORMAL LOW (ref 4.0–10.3)
lymph#: 1.6 10*3/uL (ref 0.9–3.3)
nRBC: 0 % (ref 0–0)

## 2013-01-02 NOTE — Progress Notes (Signed)
Per Dr. Truett Perna: Postpone treatment one week due to ANC 0.9. Pt and wife voice understanding. Pt taken to chemo scheduler to arrange appts.

## 2013-01-02 NOTE — Telephone Encounter (Signed)
gv adn printed appt sched and avs forpt for OCT and NOV...gv pt barium

## 2013-01-02 NOTE — Progress Notes (Signed)
   [facility]    OFFICE PROGRESS NOTE   INTERVAL HISTORY:   He returns as scheduled. He completed cycle 9 of FOLFOX on 12/13/2012. No nausea/vomiting or diarrhea. He has occasional mild abdominal discomfort. Mild numbness in the fingers. The skin rash has improved. No other complaint.  Objective:  Vital signs in last 24 hours:  Blood pressure 134/72, pulse 69, temperature 98 F (36.7 C), temperature source Oral, resp. rate 20, height 5\' 3"  (1.6 m), weight 154 lb 1.6 oz (69.899 kg).    HEENT: No thrush or ulcers Resp: Lungs clear bilaterally Cardio: Regular rate and rhythm GI: No hepatomegaly, nontender, no mass Vascular: No leg edema Neuro: Very mild decrease in vibratory sense at the fingertips bilaterally  Skin: Hyperpigmentation over the hands   Portacath/PICC-without erythema  Lab Results:  Lab Results  Component Value Date   WBC 3.2* 01/02/2013   HGB 9.7* 01/02/2013   HCT 31.1* 01/02/2013   MCV 81.8 01/02/2013   PLT 193 01/02/2013   ANC 0.9    Medications: I have reviewed the patient's current medications.  Assessment/Plan: 1.MetastaticGastric cancer-status post biopsy of a gastric mass on 07/20/2012 with the pathology confirming adenocarcinoma , HER-2 non-amplified  - CT 07/18/2012 consistent with a primary gastric mass and metastatic omental/peritoneal nodules  -biopsy of a peritoneal nodule 07/31/2012 consistent with metastatic adenocarcinoma  -Cycle 1 of FOLFOX on 08/02/2012  -Restaging CT 10/23/2012 consistent with a partial response-decrease in the primary gastric tumor and omental/peritoneal implants  3. Multiple colon polyps documented on a colonoscopy 06/22/2012-tubular adenomas, tubulovillous adenomas, and hyperplastic polyps  4. Family history of colon cancer (mother)  5. Pain secondary to the primary gastric mass or carcinomatosis -improved  6. 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  Kristin Bruins, multiple extractions were recommended  7. Anorexia, nausea, and early satiety-improved  8. 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 Nausea following chemotherapy-improved with the addition of prophylactic Decadron  10. Skin rash-most likely secondary to Decadron, improved  11. Neutropenia second chemotherapy  12. 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 He appears to be tolerating the chemotherapy well and his clinical status is stable. Chemotherapy will be held today secondary to neutropenia. He will return for cycle 10 of FOLFOX on 01/09/2013. He will be scheduled for a restaging CT after cycle 10. The plan is to make a switch to maintenance capecitabine if the restaging CT confirms stability or improvement in the metastatic gastric cancer.   Thornton Papas, MD  01/02/2013  12:24 PM

## 2013-01-10 ENCOUNTER — Other Ambulatory Visit: Payer: Self-pay | Admitting: *Deleted

## 2013-01-10 DIAGNOSIS — C169 Malignant neoplasm of stomach, unspecified: Secondary | ICD-10-CM

## 2013-01-11 ENCOUNTER — Ambulatory Visit (HOSPITAL_BASED_OUTPATIENT_CLINIC_OR_DEPARTMENT_OTHER): Payer: Medicaid Other

## 2013-01-11 ENCOUNTER — Encounter (INDEPENDENT_AMBULATORY_CARE_PROVIDER_SITE_OTHER): Payer: Self-pay

## 2013-01-11 ENCOUNTER — Other Ambulatory Visit (HOSPITAL_BASED_OUTPATIENT_CLINIC_OR_DEPARTMENT_OTHER): Payer: Medicaid Other

## 2013-01-11 VITALS — BP 124/84 | HR 79 | Temp 97.6°F | Resp 18

## 2013-01-11 DIAGNOSIS — C169 Malignant neoplasm of stomach, unspecified: Secondary | ICD-10-CM

## 2013-01-11 DIAGNOSIS — Z5111 Encounter for antineoplastic chemotherapy: Secondary | ICD-10-CM

## 2013-01-11 LAB — COMPREHENSIVE METABOLIC PANEL (CC13)
AST: 15 U/L (ref 5–34)
Albumin: 3.6 g/dL (ref 3.5–5.0)
Anion Gap: 8 mEq/L (ref 3–11)
BUN: 8.6 mg/dL (ref 7.0–26.0)
CO2: 24 mEq/L (ref 22–29)
Calcium: 9.4 mg/dL (ref 8.4–10.4)
Chloride: 110 mEq/L — ABNORMAL HIGH (ref 98–109)
Glucose: 110 mg/dl (ref 70–140)
Potassium: 3.9 mEq/L (ref 3.5–5.1)

## 2013-01-11 LAB — CBC WITH DIFFERENTIAL/PLATELET
Basophils Absolute: 0.1 10*3/uL (ref 0.0–0.1)
Eosinophils Absolute: 0.1 10*3/uL (ref 0.0–0.5)
HGB: 10.6 g/dL — ABNORMAL LOW (ref 13.0–17.1)
LYMPH%: 36.6 % (ref 14.0–49.0)
MCV: 80.4 fL (ref 79.3–98.0)
MONO#: 0.8 10*3/uL (ref 0.1–0.9)
NEUT#: 2.4 10*3/uL (ref 1.5–6.5)
RDW: 17.5 % — ABNORMAL HIGH (ref 11.0–14.6)
WBC: 5.3 10*3/uL (ref 4.0–10.3)
lymph#: 1.9 10*3/uL (ref 0.9–3.3)

## 2013-01-11 MED ORDER — PALONOSETRON HCL INJECTION 0.25 MG/5ML
INTRAVENOUS | Status: AC
  Filled 2013-01-11: qty 5

## 2013-01-11 MED ORDER — SODIUM CHLORIDE 0.9 % IJ SOLN
10.0000 mL | INTRAMUSCULAR | Status: DC | PRN
  Filled 2013-01-11: qty 10

## 2013-01-11 MED ORDER — FLUOROURACIL CHEMO INJECTION 2.5 GM/50ML
600.0000 mg | Freq: Once | INTRAVENOUS | Status: AC
  Administered 2013-01-11: 600 mg via INTRAVENOUS
  Filled 2013-01-11: qty 12

## 2013-01-11 MED ORDER — HEPARIN SOD (PORK) LOCK FLUSH 100 UNIT/ML IV SOLN
500.0000 [IU] | Freq: Once | INTRAVENOUS | Status: DC | PRN
  Filled 2013-01-11: qty 5

## 2013-01-11 MED ORDER — DEXTROSE 5 % IV SOLN
Freq: Once | INTRAVENOUS | Status: AC
  Administered 2013-01-11: 12:00:00 via INTRAVENOUS

## 2013-01-11 MED ORDER — DEXAMETHASONE SODIUM PHOSPHATE 10 MG/ML IJ SOLN
INTRAMUSCULAR | Status: AC
  Filled 2013-01-11: qty 1

## 2013-01-11 MED ORDER — DEXAMETHASONE SODIUM PHOSPHATE 10 MG/ML IJ SOLN
10.0000 mg | Freq: Once | INTRAMUSCULAR | Status: AC
  Administered 2013-01-11: 10 mg via INTRAVENOUS

## 2013-01-11 MED ORDER — OXALIPLATIN CHEMO INJECTION 100 MG/20ML
85.0000 mg/m2 | Freq: Once | INTRAVENOUS | Status: AC
  Administered 2013-01-11: 135 mg via INTRAVENOUS
  Filled 2013-01-11: qty 27

## 2013-01-11 MED ORDER — SODIUM CHLORIDE 0.9 % IV SOLN
2400.0000 mg/m2 | INTRAVENOUS | Status: DC
  Administered 2013-01-11: 3750 mg via INTRAVENOUS
  Filled 2013-01-11: qty 75

## 2013-01-11 MED ORDER — PALONOSETRON HCL INJECTION 0.25 MG/5ML
0.2500 mg | Freq: Once | INTRAVENOUS | Status: AC
  Administered 2013-01-11: 0.25 mg via INTRAVENOUS

## 2013-01-11 MED ORDER — LEUCOVORIN CALCIUM INJECTION 350 MG
600.0000 mg | Freq: Once | INTRAVENOUS | Status: AC
  Administered 2013-01-11: 600 mg via INTRAVENOUS
  Filled 2013-01-11: qty 30

## 2013-01-11 NOTE — Patient Instructions (Signed)
[facility] Discharge Instructions for Patients Receiving Chemotherapy  Today you received the following chemotherapy agents: oxaliplatin, leucovorin, 5fu  To help prevent nausea and vomiting after your treatment, we encourage you to take your nausea medication.  Take it as often as prescribed.     If you develop nausea and vomiting that is not controlled by your nausea medication, call the clinic. If it is after clinic hours your family physician or the after hours number for the clinic or go to the Emergency Department.   BELOW ARE SYMPTOMS THAT SHOULD BE REPORTED IMMEDIATELY:  *FEVER GREATER THAN 100.5 F  *CHILLS WITH OR WITHOUT FEVER  NAUSEA AND VOMITING THAT IS NOT CONTROLLED WITH YOUR NAUSEA MEDICATION  *UNUSUAL SHORTNESS OF BREATH  *UNUSUAL BRUISING OR BLEEDING  TENDERNESS IN MOUTH AND THROAT WITH OR WITHOUT PRESENCE OF ULCERS  *URINARY PROBLEMS  *BOWEL PROBLEMS  UNUSUAL RASH Items with * indicate a potential emergency and should be followed up as soon as possible.  Feel free to call the clinic you have any questions or concerns. The clinic phone number is [phone].   I have been informed and understand all the instructions given to me. I know to contact the clinic, my physician, or go to the Emergency Department if any problems should occur. I do not have any questions at this time, but understand that I may call the clinic during office hours   should I have any questions or need assistance in obtaining follow up care.    __________________________________________  _____________  __________ Signature of Patient or Authorized Representative            Date                   Time    __________________________________________ Nurse's Signature    

## 2013-01-12 LAB — CEA: CEA: 3 ng/mL (ref 0.0–5.0)

## 2013-01-13 ENCOUNTER — Other Ambulatory Visit: Payer: Self-pay | Admitting: *Deleted

## 2013-01-13 ENCOUNTER — Ambulatory Visit (HOSPITAL_BASED_OUTPATIENT_CLINIC_OR_DEPARTMENT_OTHER): Payer: Medicaid Other

## 2013-01-13 VITALS — BP 110/67 | HR 78 | Temp 98.5°F | Resp 18

## 2013-01-13 DIAGNOSIS — C169 Malignant neoplasm of stomach, unspecified: Secondary | ICD-10-CM

## 2013-01-13 DIAGNOSIS — Z452 Encounter for adjustment and management of vascular access device: Secondary | ICD-10-CM

## 2013-01-13 MED ORDER — HEPARIN SOD (PORK) LOCK FLUSH 100 UNIT/ML IV SOLN
500.0000 [IU] | Freq: Once | INTRAVENOUS | Status: AC | PRN
  Administered 2013-01-13: 500 [IU]
  Filled 2013-01-13: qty 5

## 2013-01-13 MED ORDER — SODIUM CHLORIDE 0.9 % IJ SOLN
10.0000 mL | INTRAMUSCULAR | Status: DC | PRN
  Administered 2013-01-13: 10 mL
  Filled 2013-01-13: qty 10

## 2013-01-13 MED ORDER — BACLOFEN 10 MG PO TABS: 10.0000 mg | ORAL_TABLET | Freq: Two times a day (BID) | ORAL | Status: DC | PRN

## 2013-01-23 ENCOUNTER — Other Ambulatory Visit (HOSPITAL_BASED_OUTPATIENT_CLINIC_OR_DEPARTMENT_OTHER): Payer: Medicaid Other | Admitting: Lab

## 2013-01-23 ENCOUNTER — Ambulatory Visit (HOSPITAL_COMMUNITY)
Admission: RE | Admit: 2013-01-23 | Discharge: 2013-01-23 | Disposition: A | Discharge status: Home / Self Care | Payer: Medicaid Other | Source: Ambulatory Visit | Attending: Oncology | Admitting: Oncology

## 2013-01-23 ENCOUNTER — Other Ambulatory Visit: Payer: Self-pay | Admitting: Lab

## 2013-01-23 ENCOUNTER — Encounter (HOSPITAL_COMMUNITY): Payer: Self-pay

## 2013-01-23 DIAGNOSIS — C786 Secondary malignant neoplasm of retroperitoneum and peritoneum: Secondary | ICD-10-CM | POA: Insufficient documentation

## 2013-01-23 DIAGNOSIS — D739 Disease of spleen, unspecified: Secondary | ICD-10-CM | POA: Insufficient documentation

## 2013-01-23 DIAGNOSIS — C169 Malignant neoplasm of stomach, unspecified: Secondary | ICD-10-CM | POA: Insufficient documentation

## 2013-01-23 DIAGNOSIS — Z9221 Personal history of antineoplastic chemotherapy: Secondary | ICD-10-CM | POA: Insufficient documentation

## 2013-01-23 DIAGNOSIS — K7689 Other specified diseases of liver: Secondary | ICD-10-CM | POA: Insufficient documentation

## 2013-01-23 LAB — CBC WITH DIFFERENTIAL/PLATELET
BASO%: 1.5 % (ref 0.0–2.0)
Basophils Absolute: 0.1 10*3/uL (ref 0.0–0.1)
EOS%: 3.5 % (ref 0.0–7.0)
Eosinophils Absolute: 0.2 10*3/uL (ref 0.0–0.5)
HCT: 33.6 % — ABNORMAL LOW (ref 38.4–49.9)
HGB: 10.5 g/dL — ABNORMAL LOW (ref 13.0–17.1)
MCH: 24.7 pg — ABNORMAL LOW (ref 27.2–33.4)
MCHC: 31.1 g/dL — ABNORMAL LOW (ref 32.0–36.0)
MCV: 79.4 fL (ref 79.3–98.0)
NEUT%: 45 % (ref 39.0–75.0)
RDW: 18.8 % — ABNORMAL HIGH (ref 11.0–14.6)
WBC: 4.6 10*3/uL (ref 4.0–10.3)
lymph#: 1.6 10*3/uL (ref 0.9–3.3)

## 2013-01-23 LAB — COMPREHENSIVE METABOLIC PANEL (CC13)
ALT: 10 U/L (ref 0–55)
AST: 15 U/L (ref 5–34)
Albumin: 3.7 g/dL (ref 3.5–5.0)
Alkaline Phosphatase: 84 U/L (ref 40–150)
BUN: 9 mg/dL (ref 7.0–26.0)
Calcium: 9.8 mg/dL (ref 8.4–10.4)
Chloride: 106 mEq/L (ref 98–109)
Creatinine: 0.8 mg/dL (ref 0.7–1.3)
Potassium: 4 mEq/L (ref 3.5–5.1)

## 2013-01-23 MED ORDER — IOHEXOL 300 MG/ML  SOLN
100.0000 mL | Freq: Once | INTRAMUSCULAR | Status: AC | PRN
  Administered 2013-01-23: 100 mL via INTRAVENOUS

## 2013-01-24 ENCOUNTER — Telehealth: Payer: Self-pay | Admitting: Oncology

## 2013-01-24 ENCOUNTER — Ambulatory Visit (HOSPITAL_BASED_OUTPATIENT_CLINIC_OR_DEPARTMENT_OTHER): Payer: Medicaid Other | Admitting: Oncology

## 2013-01-24 VITALS — BP 96/82 | HR 77 | Temp 98.1°F | Resp 20 | Ht 63.0 in | Wt 157.7 lb

## 2013-01-24 DIAGNOSIS — C169 Malignant neoplasm of stomach, unspecified: Secondary | ICD-10-CM

## 2013-01-24 DIAGNOSIS — C786 Secondary malignant neoplasm of retroperitoneum and peritoneum: Secondary | ICD-10-CM

## 2013-01-24 DIAGNOSIS — G620 Drug-induced polyneuropathy: Secondary | ICD-10-CM

## 2013-01-24 NOTE — Telephone Encounter (Signed)
Gave pt appt for lab,md and flush for December 2014

## 2013-01-24 NOTE — Progress Notes (Signed)
   [facility]    OFFICE PROGRESS NOTE   INTERVAL HISTORY:   He completed cycle 10 of FOLFOX on 01/11/2013. He reports increased numbness in the hands and feet. This does not interfere with activity. He otherwise feels well. Good appetite.  Objective:  Vital signs in last 24 hours:  Blood pressure 96/82, pulse 77, temperature 98.1 F (36.7 C), temperature source Oral, resp. rate 20, height 5\' 3"  (1.6 m), weight 157 lb 11.2 oz (71.532 kg).    HEENT: hyperpigmentation of the palate, no thrush or ulcer Resp: lungs clear bilaterally Cardio: regular rate and rhythm GI: no hepatosplenomegaly, nontender, no mass Vascular: no leg edema  Skin:hyperpigmentation of the hands   Portacath/PICC-without erythema  Lab Results:  Lab Results  Component Value Date   WBC 4.6 01/23/2013   HGB 10.5* 01/23/2013   HCT 33.6* 01/23/2013   MCV 79.4 01/23/2013   PLT 181 01/23/2013  ANC 2.1  CEA 3.0 on 01/11/2013  X-rays:CT of the abdomen and pelvis on 01/23/2013, compared to 10/23/2012: No suspicious liver lesion.subcapsular splenic lesion is slightly larger. Persistent thickening of the gastric wall.no new omental or peritoneal lesions. An 8 mm right sided omental lesion is no longer apparent. A nodule within the gastrocolic ligament is smaller.   Medications: I have reviewed the patient's current medications.  Assessment/Plan: 1.MetastaticGastric cancer-status post biopsy of a gastric mass on 07/20/2012 with the pathology confirming adenocarcinoma , HER-2 non-amplified  - CT 07/18/2012 consistent with a primary gastric mass and metastatic omental/peritoneal nodules  -biopsy of a peritoneal nodule 07/31/2012 consistent with metastatic adenocarcinoma  -Cycle 1 of FOLFOX on 08/02/2012  -Restaging CT 10/23/2012 consistent with a partial response-decrease in the primary gastric tumor and omental/peritoneal implants  -cycle 10 of FOLFOX completed 01/11/2013 -normal CEA  01/11/2013 -Restaging CT 01/23/2013 with improvement in the peritoneal metastases and an increased size of an indeterminate splenic lesion 3. Multiple colon polyps documented on a colonoscopy 06/22/2012-tubular adenomas, tubulovillous adenomas, and hyperplastic polyps  4. Family history of colon cancer (mother)  5. Pain secondary to the primary gastric mass or carcinomatosis -improved  6. 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 Kristin Bruins, multiple extractions were recommended  7. Anorexia, nausea, and early satiety-improved  8. 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 Nausea following chemotherapy-improved with the addition of prophylactic Decadron  10. Skin rash-most likely secondary to Decadron, improved  11. History ofNeutropenia second chemotherapy  12. Presentation with clinical evidence of a dental abscess on 11/03/2012, placed on antibiotics, status post multiple tooth extractions on 11/10/2012  13. oxaliplatin neuropathy   Disposition:  He appears stable. The restaging CT evaluation reveals no clear evidence of disease progression. I discussed treatment options with Joseph Salinas and his wife. I recommend "maintenance "therapy with capecitabine. We discussed the potential toxicities associated with capecitabine including the chance for mucositis, diarrhea, rash, hyperpigmentation, and the hand/foot syndrome. He agrees to proceed. The plan is to begin capecitabine on a 7 day on/7 day off schedule 01/29/2013. He will return for an office visit, CBC, and Port-A-Cath flush in one month.   Thornton Papas, MD  01/24/2013  10:52 AM

## 2013-01-25 ENCOUNTER — Other Ambulatory Visit: Payer: Self-pay | Admitting: *Deleted

## 2013-01-25 ENCOUNTER — Encounter: Payer: Self-pay | Admitting: Oncology

## 2013-01-25 MED ORDER — CAPECITABINE 500 MG PO TABS: 1500.0000 mg | ORAL_TABLET | Freq: Two times a day (BID) | ORAL | Status: DC

## 2013-01-25 NOTE — Progress Notes (Signed)
Faxed xeloda prescription to [facility]. °

## 2013-02-21 ENCOUNTER — Ambulatory Visit (HOSPITAL_BASED_OUTPATIENT_CLINIC_OR_DEPARTMENT_OTHER): Payer: Medicaid Other | Admitting: Oncology

## 2013-02-21 ENCOUNTER — Telehealth: Payer: Self-pay | Admitting: *Deleted

## 2013-02-21 ENCOUNTER — Telehealth: Payer: Self-pay | Admitting: Oncology

## 2013-02-21 ENCOUNTER — Other Ambulatory Visit (HOSPITAL_BASED_OUTPATIENT_CLINIC_OR_DEPARTMENT_OTHER): Payer: Medicaid Other | Admitting: Lab

## 2013-02-21 ENCOUNTER — Ambulatory Visit (HOSPITAL_BASED_OUTPATIENT_CLINIC_OR_DEPARTMENT_OTHER): Payer: Medicaid Other

## 2013-02-21 VITALS — BP 119/74 | HR 88 | Temp 98.0°F | Resp 20 | Ht 63.0 in | Wt 153.7 lb

## 2013-02-21 VITALS — BP 119/75 | HR 83 | Temp 97.7°F | Resp 18

## 2013-02-21 DIAGNOSIS — C169 Malignant neoplasm of stomach, unspecified: Secondary | ICD-10-CM

## 2013-02-21 DIAGNOSIS — G893 Neoplasm related pain (acute) (chronic): Secondary | ICD-10-CM

## 2013-02-21 DIAGNOSIS — C786 Secondary malignant neoplasm of retroperitoneum and peritoneum: Secondary | ICD-10-CM

## 2013-02-21 DIAGNOSIS — G620 Drug-induced polyneuropathy: Secondary | ICD-10-CM

## 2013-02-21 DIAGNOSIS — Z95828 Presence of other vascular implants and grafts: Secondary | ICD-10-CM

## 2013-02-21 LAB — CBC WITH DIFFERENTIAL/PLATELET
BASO%: 0.5 % (ref 0.0–2.0)
EOS%: 1.6 % (ref 0.0–7.0)
Eosinophils Absolute: 0.1 10*3/uL (ref 0.0–0.5)
HCT: 36.7 % — ABNORMAL LOW (ref 38.4–49.9)
LYMPH%: 32.3 % (ref 14.0–49.0)
MCH: 25 pg — ABNORMAL LOW (ref 27.2–33.4)
MCHC: 31.4 g/dL — ABNORMAL LOW (ref 32.0–36.0)
MCV: 79.5 fL (ref 79.3–98.0)
MONO%: 13.1 % (ref 0.0–14.0)
NEUT%: 52.5 % (ref 39.0–75.0)
Platelets: 226 10*3/uL (ref 140–400)
RBC: 4.62 10*6/uL (ref 4.20–5.82)
RDW: 18.9 % — ABNORMAL HIGH (ref 11.0–14.6)
WBC: 5.6 10*3/uL (ref 4.0–10.3)

## 2013-02-21 MED ORDER — SODIUM CHLORIDE 0.9 % IJ SOLN
10.0000 mL | INTRAMUSCULAR | Status: DC | PRN
  Administered 2013-02-21: 10 mL via INTRAVENOUS
  Filled 2013-02-21: qty 10

## 2013-02-21 MED ORDER — HEPARIN SOD (PORK) LOCK FLUSH 100 UNIT/ML IV SOLN
500.0000 [IU] | Freq: Once | INTRAVENOUS | Status: AC
  Administered 2013-02-21: 500 [IU] via INTRAVENOUS
  Filled 2013-02-21: qty 5

## 2013-02-21 MED ORDER — PROCHLORPERAZINE MALEATE 10 MG PO TABS: 10.0000 mg | ORAL_TABLET | Freq: Four times a day (QID) | ORAL | Status: DC | PRN

## 2013-02-21 MED ORDER — CAPECITABINE 500 MG PO TABS: 1000.0000 mg | ORAL_TABLET | Freq: Two times a day (BID) | ORAL | Status: DC

## 2013-02-21 NOTE — Telephone Encounter (Signed)
Gave pt appt for lab and MD for December 2014 °

## 2013-02-21 NOTE — Progress Notes (Signed)
   [facility]    OFFICE PROGRESS NOTE   INTERVAL HISTORY:   He returns for scheduled followup of metastatic gastric cancer. He has completed 2 weeks of Xeloda. He last took Xeloda on 02/18/2013. He developed nausea over the last several days of Xeloda therapy and had an episode of vomiting on 02/18/2013. He complains of a decreased appetite while on Xeloda. He had diarrhea for several days at the end of Xeloda therapy. This improved with Imodium. He complains of increased numbness in the fingers and feet. This now interferes with activity. The hands are painful.  Objective:  Vital signs in last 24 hours:  Blood pressure 119/74, pulse 88, temperature 98 F (36.7 C), temperature source Oral, resp. rate 20, height 5\' 3"  (1.6 m), weight 153 lb 11.2 oz (69.718 kg).    HEENT: No thrush or ulcers Resp: Lungs clear bilaterally Cardio: Regular rate and rhythm GI: No hepatomegaly, nontender, no mass, no apparent ascites Vascular: No leg edema Neuro: Moderate decrease in vibratory sense at the fingertips bilaterally , he had difficulty buttoning his shirt Skin: Hyperpigmentation over the hands and feet. No erythema or skin breakdown.   Portacath/PICC-without erythema  Lab Results:  Lab Results  Component Value Date   WBC 5.6 02/21/2013   HGB 11.5* 02/21/2013   HCT 36.7* 02/21/2013   MCV 79.5 02/21/2013   PLT 226 02/21/2013   ANC 2.9    Medications: I have reviewed the patient's current medications.  Assessment/Plan: 1.MetastaticGastric cancer-status post biopsy of a gastric mass on 07/20/2012 with the pathology confirming adenocarcinoma , HER-2 non-amplified  - CT 07/18/2012 consistent with a primary gastric mass and metastatic omental/peritoneal nodules  -biopsy of a peritoneal nodule 07/31/2012 consistent with metastatic adenocarcinoma  -Cycle 1 of FOLFOX on 08/02/2012  -Restaging CT 10/23/2012 consistent with a partial response-decrease in the primary gastric tumor  and omental/peritoneal implants  -cycle 10 of FOLFOX completed 01/11/2013  -normal CEA 01/11/2013  -Restaging CT 01/23/2013 with improvement in the peritoneal metastases and an increased size of an indeterminate splenic lesion  -Initiation of maintenance Xeloda therapy on a 7 day on/7 day off schedule 01/29/2013 3. Multiple colon polyps documented on a colonoscopy 06/22/2012-tubular adenomas, tubulovillous adenomas, and hyperplastic polyps  4. Family history of colon cancer (mother)  5. Pain secondary to the primary gastric mass or carcinomatosis -improved  6. 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 Kristin Bruins, multiple extractions were recommended  7. Anorexia, nausea, and early satiety-improved  8. 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 Nausea following chemotherapy-improved with the addition of prophylactic Decadron  10. Skin rash-most likely secondary to Decadron, improved  11. History of Neutropenia second chemotherapy  12. 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 Nausea and diarrhea while treated with Xeloda    Disposition:  He has developed progressive neuropathy symptoms secondary to oxaliplatin. The Xeloda was complicated by nausea and diarrhea. The plan is to proceed with maintenance Xeloda beginning on 02/26/2013. The Xeloda will be dose reduced to 1000 mg twice daily with this cycle. Joseph Salinas will contact us if his appetite, nausea, and diarrhea are not improved by 02/26/2013. He will return for an office visit on 03/07/2013.   Thornton Papas, MD  02/21/2013  1:11 PM

## 2013-02-21 NOTE — Telephone Encounter (Signed)
This RN contacted Dept. Of SS for [facility] on behalf of patient.  The patient received a notification re: change of benefits for medicaid.  The patient needs to have a spanish interpreter available during the visit to assist in explanation of benefits.  This RN spoke with Serita Butcher, SW at DSS who will arrange interpreter services and will contact the patient for an appointment.  Patient expressed appreciation.

## 2013-02-21 NOTE — Patient Instructions (Signed)
Implanted Port Instructions  An implanted port is a central line that has a round shape and is placed under the skin. It is used for long-term IV (intravenous) access for:  · Medicine.  · Fluids.  · Liquid nutrition, such as TPN (total parenteral nutrition).  · Blood samples.  Ports can be placed:  · In the chest area just below the collarbone (this is the most common place.)  · In the arms.  · In the belly (abdomen) area.  · In the legs.  PARTS OF THE PORT  A port has 2 main parts:  · The reservoir. The reservoir is round, disc-shaped, and will be a small, raised area under your skin.  · The reservoir is the part where a needle is inserted (accessed) to either give medicines or to draw blood.  · The catheter. The catheter is a long, slender tube that extends from the reservoir. The catheter is placed into a large vein.  · Medicine that is inserted into the reservoir goes into the catheter and then into the vein.  INSERTION OF THE PORT  · The port is surgically placed in either an operating room or in a procedural area (interventional radiology).  · Medicine may be given to help you relax during the procedure.  · The skin where the port will be inserted is numbed (local anesthetic).  · 1 or 2 small cuts (incisions) will be made in the skin to insert the port.  · The port can be used after it has been inserted.  INCISION SITE CARE  · The incision site may have small adhesive strips on it. This helps keep the incision site closed. Sometimes, no adhesive strips are placed. Instead of adhesive strips, a special kind of surgical glue is used to keep the incision closed.  · If adhesive strips were placed on the incision sites, do not take them off. They will fall off on their own.  · The incision site may be sore for 1 to 2 days. Pain medicine can help.  · Do not get the incision site wet. Bathe or shower as directed by your caregiver.  · The incision site should heal in 5 to 7 days. A small scar may form after the  incision has healed.  ACCESSING THE PORT  Special steps must be taken to access the port:  · Before the port is accessed, a numbing cream can be placed on the skin. This helps numb the skin over the port site.  · A sterile technique is used to access the port.  · The port is accessed with a needle. Only "non-coring" port needles should be used to access the port. Once the port is accessed, a blood return should be checked. This helps ensure the port is in the vein and is not clogged (clotted).  · If your caregiver believes your port should remain accessed, a clear (transparent) bandage will be placed over the needle site. The bandage and needle will need to be changed every week or as directed by your caregiver.  · Keep the bandage covering the needle clean and dry. Do not get it wet. Follow your caregiver's instructions on how to take a shower or bath when the port is accessed.  · If your port does not need to stay accessed, no bandage is needed over the port.  FLUSHING THE PORT  Flushing the port keeps it from getting clogged. How often the port is flushed depends on:  · If a   constant infusion is running. If a constant infusion is running, the port may not need to be flushed.  · If intermittent medicines are given.  · If the port is not being used.  For intermittent medicines:  · The port will need to be flushed:  · After medicines have been given.  · After blood has been drawn.  · As part of routine maintenance.  · A port is normally flushed with:  · Normal saline.  · Heparin.  · Follow your caregiver's advice on how often, how much, and the type of flush to use on your port.  IMPORTANT PORT INFORMATION  · Tell your caregiver if you are allergic to heparin.  · After your port is placed, you will get a manufacturer's information card. The card has information about your port. Keep this card with you at all times.  · There are many types of ports available. Know what kind of port you have.  · In case of an  emergency, it may be helpful to wear a medical alert bracelet. This can help alert health care workers that you have a port.  · The port can stay in for as long as your caregiver believes it is necessary.  · When it is time for the port to come out, surgery will be done to remove it. The surgery will be similar to how the port was put in.  · If you are in the hospital or clinic:  · Your port will be taken care of and flushed by a nurse.  · If you are at home:  · A home health care nurse may give medicines and take care of the port.  · You or a family member can get special training and directions for giving medicine and taking care of the port at home.  SEEK IMMEDIATE MEDICAL CARE IF:   · Your port does not flush or you are unable to get a blood return.  · New drainage or pus is coming from the incision.  · A bad smell is coming from the incision site.  · You develop swelling or increased redness at the incision site.  · You develop increased swelling or pain at the port site.  · You develop swelling or pain in the surrounding skin near the port.  · You have an oral temperature above 102° F (38.9° C), not controlled by medicine.  MAKE SURE YOU:   · Understand these instructions.  · Will watch your condition.  · Will get help right away if you are not doing well or get worse.  Document Released: 03/08/2005 Document Revised: 05/31/2011 Document Reviewed: 05/30/2008  [employer]® Patient Information ©2014 [employer].

## 2013-02-21 NOTE — Telephone Encounter (Signed)
Gave pt appt for labs december and January 2014, emailed MD regarding pt asking for an MD visit °

## 2013-02-22 LAB — CEA: CEA: 3.9 ng/mL (ref 0.0–5.0)

## 2013-03-07 ENCOUNTER — Telehealth: Payer: Self-pay | Admitting: Oncology

## 2013-03-07 ENCOUNTER — Ambulatory Visit (HOSPITAL_BASED_OUTPATIENT_CLINIC_OR_DEPARTMENT_OTHER): Payer: Medicaid Other | Admitting: Nurse Practitioner

## 2013-03-07 ENCOUNTER — Other Ambulatory Visit (HOSPITAL_BASED_OUTPATIENT_CLINIC_OR_DEPARTMENT_OTHER): Payer: Medicaid Other

## 2013-03-07 VITALS — BP 116/74 | HR 76 | Temp 98.1°F | Resp 18 | Ht 63.0 in | Wt 154.7 lb

## 2013-03-07 DIAGNOSIS — C169 Malignant neoplasm of stomach, unspecified: Secondary | ICD-10-CM

## 2013-03-07 LAB — CBC WITH DIFFERENTIAL/PLATELET
BASO%: 1.9 % (ref 0.0–2.0)
Basophils Absolute: 0.1 10*3/uL (ref 0.0–0.1)
EOS%: 3 % (ref 0.0–7.0)
HCT: 35.3 % — ABNORMAL LOW (ref 38.4–49.9)
HGB: 11.2 g/dL — ABNORMAL LOW (ref 13.0–17.1)
MCH: 25.4 pg — ABNORMAL LOW (ref 27.2–33.4)
MCHC: 31.7 g/dL — ABNORMAL LOW (ref 32.0–36.0)
MCV: 79.9 fL (ref 79.3–98.0)
MONO#: 0.6 10*3/uL (ref 0.1–0.9)
MONO%: 10.6 % (ref 0.0–14.0)
NEUT%: 47.2 % (ref 39.0–75.0)
RBC: 4.42 10*6/uL (ref 4.20–5.82)
RDW: 21.2 % — ABNORMAL HIGH (ref 11.0–14.6)
lymph#: 1.9 10*3/uL (ref 0.9–3.3)

## 2013-03-07 LAB — COMPREHENSIVE METABOLIC PANEL (CC13)
ALT: 18 U/L (ref 0–55)
AST: 21 U/L (ref 5–34)
Albumin: 3.7 g/dL (ref 3.5–5.0)
Alkaline Phosphatase: 71 U/L (ref 40–150)
BUN: 8.8 mg/dL (ref 7.0–26.0)
Creatinine: 1 mg/dL (ref 0.7–1.3)
Glucose: 122 mg/dl (ref 70–140)
Potassium: 4.3 mEq/L (ref 3.5–5.1)

## 2013-03-07 NOTE — Telephone Encounter (Signed)
gv pt appt schedule for january °

## 2013-03-07 NOTE — Patient Instructions (Signed)
Resume XELODA 500 mg twice daily on 03/20/13 if hands and feet are better. If hands and feet are not better, do not start Xeloda and call the office ([phone]).

## 2013-03-07 NOTE — Progress Notes (Addendum)
OFFICE PROGRESS NOTE  Interval history:  Joseph Salinas returns for follow up of metastatic gastric cancer. He is currently on a maintenance Xeloda. The Xeloda dose was reduced to 1000 mg twice daily with the most recent cycle.  He is currently on the off week of Xeloda. He denies nausea. No mouth sores. No diarrhea. He reports peeling skin over the hands and a "burning" pain over the hands and feet. Appetite and energy level are poor.   Objective: Blood pressure 116/74, pulse 76, temperature 98.1 F (36.7 C), temperature source Oral, resp. rate 18, height 5\' 3"  (1.6 m), weight 154 lb 11.2 oz (70.171 kg).  No thrush or ulceration. Lungs are clear. Regular cardiac rhythm. Abdomen is soft and nontender. No hepatomegaly. No leg edema. Palms with mild erythema and areas of dry desquamation. Soles are erythematous and tender; no skin breakdown.  Lab Results: Lab Results  Component Value Date   WBC 5.2 03/07/2013   HGB 11.2* 03/07/2013   HCT 35.3* 03/07/2013   MCV 79.9 03/07/2013   PLT 223 03/07/2013   ANC 2.5  Chemistry:    Chemistry      Component Value Date/Time   NA 142 01/23/2013 1157   NA 138 11/10/2012 0627   K 4.0 01/23/2013 1157   K 4.1 11/10/2012 0627   CL 104 11/10/2012 0627   CL 103 09/06/2012 1034   CO2 25 01/23/2013 1157   CO2 26 11/10/2012 0627   BUN 9.0 01/23/2013 1157   BUN 9 11/10/2012 0627   CREATININE 0.8 01/23/2013 1157   CREATININE 0.74 11/10/2012 0627      Component Value Date/Time   CALCIUM 9.8 01/23/2013 1157   CALCIUM 9.3 11/10/2012 0627   ALKPHOS 84 01/23/2013 1157   ALKPHOS 54 07/17/2012 1528   AST 15 01/23/2013 1157   AST 18 07/17/2012 1528   ALT 10 01/23/2013 1157   ALT 15 07/17/2012 1528   BILITOT 0.51 01/23/2013 1157   BILITOT 0.4 07/17/2012 1528       Studies/Results: No results found.  Medications: I have reviewed the patient's current medications.  Assessment/Plan:  1.Metastatic gastric cancer-status post biopsy of a gastric mass on 07/20/2012 with  the pathology confirming adenocarcinoma, HER-2 non-amplified.  - CT 07/18/2012 consistent with a primary gastric mass and metastatic omental/peritoneal nodules  -biopsy of a peritoneal nodule 07/31/2012 consistent with metastatic adenocarcinoma  -Cycle 1 of FOLFOX on 08/02/2012  -Restaging CT 10/23/2012 consistent with a partial response-decrease in the primary gastric tumor and omental/peritoneal implants  -cycle 10 of FOLFOX completed 01/11/2013  -normal CEA 01/11/2013  -Restaging CT 01/23/2013 with improvement in the peritoneal metastases and an increased size of an indeterminate splenic lesion  -Initiation of maintenance Xeloda therapy on a 7 day on/7 day off schedule 01/29/2013. -Xeloda dose reduced to 1000 mg twice daily 7 days on/7 days off beginning 02/26/2013 due to nausea and diarrhea.  3. Multiple colon polyps documented on a colonoscopy 06/22/2012-tubular adenomas, tubulovillous adenomas, and hyperplastic polyps  4. Family history of colon cancer (mother)  5. Pain secondary to the primary gastric mass or carcinomatosis -improved  6. 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 Kristin Bruins, multiple extractions were recommended  7. Anorexia, nausea, and early satiety-improved  8. 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 Nausea following chemotherapy-improved with the addition of prophylactic Decadron  10. Skin rash-most likely secondary to Decadron, improved  11. History of Neutropenia secondary to chemotherapy  12. 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 Nausea and diarrhea while treated with Xeloda. Improved. 15. Hand-foot syndrome 03/07/2013 manifesting with erythema, dry desquamation and pain on the palms and erythema and pain on the soles.  Disposition-he has developed  hand-foot syndrome related to Xeloda. We instructed him to hold Xeloda for now. If his hands and feet are better on 03/20/2013 he will resume Xeloda at a dose of 500 mg twice daily 7 days on/7 days off. If hands and feet are not better by 03/20/2013 he will not resume Xeloda and will contact the office to notify. We will see him in followup on 03/30/2013. He will contact the office in the interim as outlined above or with any other problems.  Patient seen with Dr. Truett Perna.   Lonna Cobb ANP/GNP-BC   This was a shared visit with Lonna Cobb. Joseph Salinas was interviewed and examined. The plan is to further dose reduce the Xeloda.  Mancel Bale, M.D.

## 2013-03-30 ENCOUNTER — Telehealth: Payer: Self-pay | Admitting: Oncology

## 2013-03-30 ENCOUNTER — Other Ambulatory Visit: Payer: Self-pay | Admitting: *Deleted

## 2013-03-30 ENCOUNTER — Other Ambulatory Visit (HOSPITAL_BASED_OUTPATIENT_CLINIC_OR_DEPARTMENT_OTHER): Payer: Medicaid Other

## 2013-03-30 ENCOUNTER — Ambulatory Visit (HOSPITAL_BASED_OUTPATIENT_CLINIC_OR_DEPARTMENT_OTHER): Payer: Medicaid Other | Admitting: Oncology

## 2013-03-30 VITALS — BP 132/80 | HR 71 | Temp 97.7°F | Resp 18 | Ht 63.0 in | Wt 158.1 lb

## 2013-03-30 DIAGNOSIS — C169 Malignant neoplasm of stomach, unspecified: Secondary | ICD-10-CM

## 2013-03-30 DIAGNOSIS — C786 Secondary malignant neoplasm of retroperitoneum and peritoneum: Secondary | ICD-10-CM

## 2013-03-30 DIAGNOSIS — T451X5A Adverse effect of antineoplastic and immunosuppressive drugs, initial encounter: Secondary | ICD-10-CM

## 2013-03-30 DIAGNOSIS — Z85038 Personal history of other malignant neoplasm of large intestine: Secondary | ICD-10-CM

## 2013-03-30 DIAGNOSIS — K5900 Constipation, unspecified: Secondary | ICD-10-CM

## 2013-03-30 DIAGNOSIS — L270 Generalized skin eruption due to drugs and medicaments taken internally: Secondary | ICD-10-CM

## 2013-03-30 DIAGNOSIS — G622 Polyneuropathy due to other toxic agents: Secondary | ICD-10-CM

## 2013-03-30 DIAGNOSIS — R110 Nausea: Secondary | ICD-10-CM

## 2013-03-30 LAB — COMPREHENSIVE METABOLIC PANEL (CC13)
ALBUMIN: 3.8 g/dL (ref 3.5–5.0)
ALT: 12 U/L (ref 0–55)
ANION GAP: 8 meq/L (ref 3–11)
AST: 14 U/L (ref 5–34)
Alkaline Phosphatase: 76 U/L (ref 40–150)
BUN: 11.1 mg/dL (ref 7.0–26.0)
CALCIUM: 9.5 mg/dL (ref 8.4–10.4)
CHLORIDE: 107 meq/L (ref 98–109)
CO2: 26 meq/L (ref 22–29)
Creatinine: 0.9 mg/dL (ref 0.7–1.3)
Glucose: 106 mg/dl (ref 70–140)
POTASSIUM: 4 meq/L (ref 3.5–5.1)
SODIUM: 141 meq/L (ref 136–145)
TOTAL PROTEIN: 7.2 g/dL (ref 6.4–8.3)
Total Bilirubin: 0.44 mg/dL (ref 0.20–1.20)

## 2013-03-30 LAB — CBC WITH DIFFERENTIAL/PLATELET
BASO%: 1.8 % (ref 0.0–2.0)
Basophils Absolute: 0.1 10*3/uL (ref 0.0–0.1)
EOS%: 3.1 % (ref 0.0–7.0)
Eosinophils Absolute: 0.2 10*3/uL (ref 0.0–0.5)
HEMATOCRIT: 33 % — AB (ref 38.4–49.9)
HGB: 10.6 g/dL — ABNORMAL LOW (ref 13.0–17.1)
LYMPH#: 1.8 10*3/uL (ref 0.9–3.3)
LYMPH%: 36.2 % (ref 14.0–49.0)
MCH: 25.8 pg — ABNORMAL LOW (ref 27.2–33.4)
MCHC: 32.2 g/dL (ref 32.0–36.0)
MCV: 80 fL (ref 79.3–98.0)
MONO#: 0.6 10*3/uL (ref 0.1–0.9)
MONO%: 11.9 % (ref 0.0–14.0)
NEUT#: 2.3 10*3/uL (ref 1.5–6.5)
NEUT%: 47 % (ref 39.0–75.0)
Platelets: 235 10*3/uL (ref 140–400)
RBC: 4.12 10*6/uL — ABNORMAL LOW (ref 4.20–5.82)
RDW: 23.6 % — AB (ref 11.0–14.6)
WBC: 4.9 10*3/uL (ref 4.0–10.3)

## 2013-03-30 MED ORDER — HEPARIN SOD (PORK) LOCK FLUSH 100 UNIT/ML IV SOLN
500.0000 [IU] | Freq: Once | INTRAVENOUS | Status: AC
Start: 2013-03-30 — End: 2013-03-30
  Administered 2013-03-30: 500 [IU] via INTRAVENOUS
  Filled 2013-03-30: qty 5

## 2013-03-30 MED ORDER — CAPECITABINE 500 MG PO TABS: 1000.0000 mg | ORAL_TABLET | Freq: Two times a day (BID) | ORAL | Status: DC

## 2013-03-30 MED ORDER — SODIUM CHLORIDE 0.9 % IJ SOLN
10.0000 mL | INTRAMUSCULAR | Status: DC | PRN
  Administered 2013-03-30: 10 mL via INTRAVENOUS
  Filled 2013-03-30: qty 10

## 2013-03-30 NOTE — Telephone Encounter (Signed)
gv and printed appt sched and avs for pt for Feb  °

## 2013-03-30 NOTE — Progress Notes (Signed)
   [facility]    OFFICE PROGRESS NOTE   INTERVAL HISTORY:   He returns as scheduled. He completed another cycle of capecitabine on 03/26/2013. He felt better after being off of the capecitabine for 2 weeks. He continues to have neuropathy symptoms in the hands. This makes it difficult for him to open objects. No nausea, abdominal pain, mouth sores, or diarrhea.  Objective:  Vital signs in last 24 hours:  Blood pressure 132/80, pulse 71, temperature 97.7 F (36.5 C), temperature source Oral, resp. rate 18, height $RemoveBe'5\' 3"'[id number]$  (1.6 m), weight 158 lb 1.6 oz (71.714 kg).    HEENT: hyperpigmentation of the mucous membranes, no thrush or ulcers Resp: lungs clear bilaterally Cardio: regular rate and rhythm GI: no hepatomegaly, nontender Vascular: no leg edema  Skin:hyperpigmentation of the hands and soles.   Portacath/PICC-without erythema  Lab Results:  Lab Results  Component Value Date   WBC 4.9 03/30/2013   HGB 10.6* 03/30/2013   HCT 33.0* 03/30/2013   MCV 80.0 03/30/2013   PLT 235 03/30/2013   NEUTROABS 2.3 03/30/2013   CEA on 02/21/2013-3.9   Medications: I have reviewed the patient's current medications.  Assessment/Plan: 1.Metastatic gastric cancer-status post biopsy of a gastric mass on 07/20/2012 with the pathology confirming adenocarcinoma, HER-2 non-amplified.  - CT 07/18/2012 consistent with a primary gastric mass and metastatic omental/peritoneal nodules  -biopsy of a peritoneal nodule 07/31/2012 consistent with metastatic adenocarcinoma  -Cycle 1 of FOLFOX on 08/02/2012  -Restaging CT 10/23/2012 consistent with a partial response-decrease in the primary gastric tumor and omental/peritoneal implants  -cycle 10 of FOLFOX completed 01/11/2013  -normal CEA 01/11/2013  -Restaging CT 01/23/2013 with improvement in the peritoneal metastases and an increased size of an indeterminate splenic lesion  -Initiation of maintenance Xeloda therapy on a 7 day on/7 day off  schedule 01/29/2013.  -Xeloda dose reduced to 1000 mg twice daily 7 days on/7 days off beginning 02/26/2013 due to nausea and diarrhea.  -Xeloda dose reduced to 500 mg twice daily, 7 days/14 days off 03/20/2013 3. Multiple colon polyps documented on a colonoscopy 06/22/2012-tubular adenomas, tubulovillous adenomas, and hyperplastic polyps  4. Family history of colon cancer (mother)  64. Pain secondary to the primary gastric mass or carcinomatosis -improved  6. 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 Enrique Sack, multiple extractions were recommended  7. Anorexia, nausea, and early satiety-improved  8. 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 Nausea following chemotherapy-improved with the addition of prophylactic Decadron  10. Skin rash-most likely secondary to Decadron, improved  11. History of Neutropenia secondary to chemotherapy  12. 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 Nausea and diarrhea while treated with Xeloda. Improved.  15. Hand-foot syndrome 03/07/2013 manifesting with erythema, dry desquamation and pain on the palms and erythema and pain on the soles.    Disposition:  The hand/foot syndrome appears improved. He would like to continue Xeloda on a 7 day on/14 day off schedule. He will begin the next cycle of Xeloda on 04/09/2013.He is scheduled for an office visit to 04/25/2013.   Betsy Coder, MD  03/30/2013  10:58 AM

## 2013-03-30 NOTE — Telephone Encounter (Signed)
Received fax from [facility], Xeloda Rx must be sent to specialty pharmacy. Rx faxed to [facility].

## 2013-04-19 ENCOUNTER — Other Ambulatory Visit: Payer: Self-pay | Admitting: *Deleted

## 2013-04-19 MED ORDER — CAPECITABINE 500 MG PO TABS: 1000.0000 mg | ORAL_TABLET | Freq: Two times a day (BID) | ORAL | Status: DC

## 2013-04-25 ENCOUNTER — Other Ambulatory Visit (HOSPITAL_BASED_OUTPATIENT_CLINIC_OR_DEPARTMENT_OTHER): Payer: BC Managed Care – PPO

## 2013-04-25 ENCOUNTER — Ambulatory Visit (HOSPITAL_BASED_OUTPATIENT_CLINIC_OR_DEPARTMENT_OTHER): Payer: BC Managed Care – PPO | Admitting: Nurse Practitioner

## 2013-04-25 ENCOUNTER — Telehealth: Payer: Self-pay | Admitting: Oncology

## 2013-04-25 VITALS — BP 123/78 | HR 78 | Temp 97.5°F | Resp 18 | Ht 63.0 in | Wt 159.9 lb

## 2013-04-25 DIAGNOSIS — G620 Drug-induced polyneuropathy: Secondary | ICD-10-CM

## 2013-04-25 DIAGNOSIS — L270 Generalized skin eruption due to drugs and medicaments taken internally: Secondary | ICD-10-CM

## 2013-04-25 DIAGNOSIS — C169 Malignant neoplasm of stomach, unspecified: Secondary | ICD-10-CM

## 2013-04-25 DIAGNOSIS — C786 Secondary malignant neoplasm of retroperitoneum and peritoneum: Secondary | ICD-10-CM

## 2013-04-25 DIAGNOSIS — Z85038 Personal history of other malignant neoplasm of large intestine: Secondary | ICD-10-CM

## 2013-04-25 LAB — CBC WITH DIFFERENTIAL/PLATELET
BASO%: 2 % (ref 0.0–2.0)
Basophils Absolute: 0.1 10*3/uL (ref 0.0–0.1)
EOS%: 8.5 % — AB (ref 0.0–7.0)
Eosinophils Absolute: 0.5 10*3/uL (ref 0.0–0.5)
HCT: 34.3 % — ABNORMAL LOW (ref 38.4–49.9)
HGB: 11 g/dL — ABNORMAL LOW (ref 13.0–17.1)
LYMPH#: 1.6 10*3/uL (ref 0.9–3.3)
LYMPH%: 25.5 % (ref 14.0–49.0)
MCH: 25.3 pg — ABNORMAL LOW (ref 27.2–33.4)
MCHC: 32 g/dL (ref 32.0–36.0)
MCV: 79.3 fL (ref 79.3–98.0)
MONO#: 0.8 10*3/uL (ref 0.1–0.9)
MONO%: 12.9 % (ref 0.0–14.0)
NEUT#: 3.2 10*3/uL (ref 1.5–6.5)
NEUT%: 51.1 % (ref 39.0–75.0)
Platelets: 229 10*3/uL (ref 140–400)
RBC: 4.33 10*6/uL (ref 4.20–5.82)
RDW: 24.6 % — AB (ref 11.0–14.6)
WBC: 6.3 10*3/uL (ref 4.0–10.3)

## 2013-04-25 NOTE — Progress Notes (Addendum)
OFFICE PROGRESS NOTE  Interval history:  Joseph Salinas returns for followup of metastatic gastric cancer. He continues maintenance Xeloda. He is currently on week 2 of the break.  He reports persistent numbness/tingling in the hands and feet. Hands and feet are painful, hands more so. He is unable to close his hands completely or grip objects due to pain. He denies any skin breakdown. No mouth sores. No nausea or vomiting. No diarrhea. Appetite is "okay".   Objective: Filed Vitals:   04/25/13 0854  BP: 123/78  Pulse: 78  Temp: 97.5 F (36.4 C)  Resp: 18   Oropharynx is without thrush or ulceration. Lungs are clear. Regular cardiac rhythm. Port-A-Cath site without erythema. Abdomen soft and nontender. No hepatomegaly. No leg edema. Palms and soles with hyperpigmentation, tenderness. Palms with mild erythema; no skin breakdown. Soles of a dry appearance. Vibratory sense is moderately decreased over the fingertips per tuning fork exam.   Lab Results: Lab Results  Component Value Date   WBC 6.3 04/25/2013   HGB 11.0* 04/25/2013   HCT 34.3* 04/25/2013   MCV 79.3 04/25/2013   PLT 229 04/25/2013   NEUTROABS 3.2 04/25/2013    Chemistry:    Chemistry      Component Value Date/Time   NA 141 03/30/2013 0845   NA 138 11/10/2012 0627   K 4.0 03/30/2013 0845   K 4.1 11/10/2012 0627   CL 104 11/10/2012 0627   CL 103 09/06/2012 1034   CO2 26 03/30/2013 0845   CO2 26 11/10/2012 0627   BUN 11.1 03/30/2013 0845   BUN 9 11/10/2012 0627   CREATININE 0.9 03/30/2013 0845   CREATININE 0.74 11/10/2012 0627      Component Value Date/Time   CALCIUM 9.5 03/30/2013 0845   CALCIUM 9.3 11/10/2012 0627   ALKPHOS 76 03/30/2013 0845   ALKPHOS 54 07/17/2012 1528   AST 14 03/30/2013 0845   AST 18 07/17/2012 1528   ALT 12 03/30/2013 0845   ALT 15 07/17/2012 1528   BILITOT 0.44 03/30/2013 0845   BILITOT 0.4 07/17/2012 1528     CEA pending.  Studies/Results: No results found.  Medications: I have reviewed the patient's current  medications.  Assessment/Plan: 1.Metastatic gastric cancer-status post biopsy of a gastric mass on 07/20/2012 with the pathology confirming adenocarcinoma, HER-2 non-amplified.  - CT 07/18/2012 consistent with a primary gastric mass and metastatic omental/peritoneal nodules  -biopsy of a peritoneal nodule 07/31/2012 consistent with metastatic adenocarcinoma  -Cycle 1 of FOLFOX on 08/02/2012  -Restaging CT 10/23/2012 consistent with a partial response-decrease in the primary gastric tumor and omental/peritoneal implants  -cycle 10 of FOLFOX completed 01/11/2013  -normal CEA 01/11/2013  -Restaging CT 01/23/2013 with improvement in the peritoneal metastases and an increased size of an indeterminate splenic lesion  -Initiation of maintenance Xeloda therapy on a 7 day on/7 day off schedule 01/29/2013.  -Xeloda dose reduced to 1000 mg twice daily 7 days on/7 days off beginning 02/26/2013 due to nausea and diarrhea.  -Xeloda dose reduced to 500 mg twice daily, 7 days/14 days off 03/20/2013.  3. Multiple colon polyps documented on a colonoscopy 06/22/2012-tubular adenomas, tubulovillous adenomas, and hyperplastic polyps  4. Family history of colon cancer (mother)  64. Pain secondary to the primary gastric mass or carcinomatosis -improved  6. 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 Enrique Sack, multiple extractions were recommended  7. Anorexia, nausea, and early satiety-improved  8. 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 Nausea following chemotherapy-improved with  the addition of prophylactic Decadron  10. Skin rash-most likely secondary to Decadron, improved  11. History of Neutropenia secondary to chemotherapy  12. Presentation with clinical evidence of a dental abscess on 11/03/2012, placed on antibiotics, status post multiple tooth extractions on 11/10/2012  13. oxaliplatin neuropathy. Persistent. 14. Nausea and diarrhea while  treated with Xeloda. Improved.  15. Hand-foot syndrome 03/07/2013 manifesting with erythema, dry desquamation and pain on the palms and erythema and pain on the soles. Progressive hand pain 04/25/2013.  Dispositon-he has increased pain on the hands and feet. This is most likely due to hand foot syndrome. Dr. Benay Spice recommends discontinuing Xeloda and initiating infusional 5-FU as per the FOLFOX regimen (no oxaliplatin) if symptoms are better in 2 weeks.  We will see Joseph Salinas on 05/09/2013 with plans to proceed with infusional 5-FU that day if he is improved.   Patient seen with Dr. Benay Spice.   Ned Card ANP/GNP-BC  This was a shared visit Ned Card.  Joseph Salinas continues maintenance Xeloda. He has developed disabling hand-foot syndrome despite a dose reduction and an interval change of the Xeloda.  We decided to discontinue Xeloda. He will begin infusional 5-FU on 05/09/2013. He previously tolerated the infusional 5-FU well.  Julieanne Manson, M.D.

## 2013-04-25 NOTE — Telephone Encounter (Signed)
gv and printed appt sched and avs for pt for Feb...sed added tx.   °

## 2013-04-26 ENCOUNTER — Other Ambulatory Visit: Payer: Self-pay | Admitting: *Deleted

## 2013-04-26 DIAGNOSIS — C169 Malignant neoplasm of stomach, unspecified: Secondary | ICD-10-CM

## 2013-04-26 LAB — CEA: CEA: 9.1 ng/mL — ABNORMAL HIGH (ref 0.0–5.0)

## 2013-05-06 ENCOUNTER — Other Ambulatory Visit: Payer: Self-pay | Admitting: Oncology

## 2013-05-08 ENCOUNTER — Other Ambulatory Visit: Payer: Self-pay | Admitting: Oncology

## 2013-05-09 ENCOUNTER — Telehealth: Payer: Self-pay | Admitting: Oncology

## 2013-05-09 ENCOUNTER — Ambulatory Visit (HOSPITAL_BASED_OUTPATIENT_CLINIC_OR_DEPARTMENT_OTHER): Payer: BC Managed Care – PPO | Admitting: Nurse Practitioner

## 2013-05-09 ENCOUNTER — Ambulatory Visit (HOSPITAL_BASED_OUTPATIENT_CLINIC_OR_DEPARTMENT_OTHER): Payer: BC Managed Care – PPO

## 2013-05-09 ENCOUNTER — Other Ambulatory Visit (HOSPITAL_BASED_OUTPATIENT_CLINIC_OR_DEPARTMENT_OTHER): Payer: BC Managed Care – PPO

## 2013-05-09 VITALS — BP 139/84 | HR 72 | Temp 97.8°F | Resp 18 | Ht 63.0 in | Wt 160.0 lb

## 2013-05-09 DIAGNOSIS — Z5111 Encounter for antineoplastic chemotherapy: Secondary | ICD-10-CM

## 2013-05-09 DIAGNOSIS — C169 Malignant neoplasm of stomach, unspecified: Secondary | ICD-10-CM

## 2013-05-09 DIAGNOSIS — C786 Secondary malignant neoplasm of retroperitoneum and peritoneum: Secondary | ICD-10-CM

## 2013-05-09 DIAGNOSIS — G893 Neoplasm related pain (acute) (chronic): Secondary | ICD-10-CM

## 2013-05-09 DIAGNOSIS — T451X5A Adverse effect of antineoplastic and immunosuppressive drugs, initial encounter: Secondary | ICD-10-CM

## 2013-05-09 DIAGNOSIS — L270 Generalized skin eruption due to drugs and medicaments taken internally: Secondary | ICD-10-CM

## 2013-05-09 LAB — CBC WITH DIFFERENTIAL/PLATELET
BASO%: 1.4 % (ref 0.0–2.0)
Basophils Absolute: 0.1 10*3/uL (ref 0.0–0.1)
EOS%: 8.1 % — ABNORMAL HIGH (ref 0.0–7.0)
Eosinophils Absolute: 0.5 10*3/uL (ref 0.0–0.5)
HCT: 33.9 % — ABNORMAL LOW (ref 38.4–49.9)
HGB: 10.7 g/dL — ABNORMAL LOW (ref 13.0–17.1)
LYMPH%: 26.6 % (ref 14.0–49.0)
MCH: 24.7 pg — ABNORMAL LOW (ref 27.2–33.4)
MCHC: 31.6 g/dL — ABNORMAL LOW (ref 32.0–36.0)
MCV: 78.2 fL — AB (ref 79.3–98.0)
MONO#: 0.7 10*3/uL (ref 0.1–0.9)
MONO%: 11.2 % (ref 0.0–14.0)
NEUT%: 52.7 % (ref 39.0–75.0)
NEUTROS ABS: 3.2 10*3/uL (ref 1.5–6.5)
Platelets: 220 10*3/uL (ref 140–400)
RBC: 4.34 10*6/uL (ref 4.20–5.82)
RDW: 22.1 % — ABNORMAL HIGH (ref 11.0–14.6)
WBC: 6 10*3/uL (ref 4.0–10.3)
lymph#: 1.6 10*3/uL (ref 0.9–3.3)

## 2013-05-09 LAB — COMPREHENSIVE METABOLIC PANEL (CC13)
ALK PHOS: 71 U/L (ref 40–150)
ALT: 13 U/L (ref 0–55)
ANION GAP: 11 meq/L (ref 3–11)
AST: 17 U/L (ref 5–34)
Albumin: 3.9 g/dL (ref 3.5–5.0)
BILIRUBIN TOTAL: 0.39 mg/dL (ref 0.20–1.20)
BUN: 12.2 mg/dL (ref 7.0–26.0)
CALCIUM: 9.5 mg/dL (ref 8.4–10.4)
CO2: 25 mEq/L (ref 22–29)
Chloride: 107 mEq/L (ref 98–109)
Creatinine: 0.9 mg/dL (ref 0.7–1.3)
Glucose: 107 mg/dl (ref 70–140)
Potassium: 4.1 mEq/L (ref 3.5–5.1)
SODIUM: 143 meq/L (ref 136–145)
Total Protein: 7 g/dL (ref 6.4–8.3)

## 2013-05-09 LAB — CEA: CEA: 9.5 ng/mL — AB (ref 0.0–5.0)

## 2013-05-09 MED ORDER — SODIUM CHLORIDE 0.9 % IV SOLN
1920.0000 mg/m2 | INTRAVENOUS | Status: DC
  Administered 2013-05-09: 3450 mg via INTRAVENOUS
  Filled 2013-05-09: qty 69

## 2013-05-09 MED ORDER — PROCHLORPERAZINE MALEATE 10 MG PO TABS
10.0000 mg | ORAL_TABLET | Freq: Once | ORAL | Status: AC
  Administered 2013-05-09: 10 mg via ORAL

## 2013-05-09 MED ORDER — SODIUM CHLORIDE 0.9 % IV SOLN
Freq: Once | INTRAVENOUS | Status: AC
  Administered 2013-05-09: 10:00:00 via INTRAVENOUS

## 2013-05-09 MED ORDER — DEXTROSE 5 % IV SOLN
300.0000 mg/m2 | Freq: Once | INTRAVENOUS | Status: AC
  Administered 2013-05-09: 540 mg via INTRAVENOUS
  Filled 2013-05-09: qty 27

## 2013-05-09 MED ORDER — LIDOCAINE-PRILOCAINE 2.5-2.5 % EX CREA: 1.0000 "application " | TOPICAL_CREAM | CUTANEOUS | Status: DC | PRN

## 2013-05-09 MED ORDER — PROCHLORPERAZINE MALEATE 10 MG PO TABS
ORAL_TABLET | ORAL | Status: AC
  Filled 2013-05-09: qty 1

## 2013-05-09 MED ORDER — HEPARIN SOD (PORK) LOCK FLUSH 100 UNIT/ML IV SOLN
500.0000 [IU] | Freq: Once | INTRAVENOUS | Status: DC | PRN
  Filled 2013-05-09: qty 5

## 2013-05-09 MED ORDER — SODIUM CHLORIDE 0.9 % IJ SOLN
10.0000 mL | INTRAMUSCULAR | Status: DC | PRN
  Filled 2013-05-09: qty 10

## 2013-05-09 MED ORDER — FLUOROURACIL CHEMO INJECTION 2.5 GM/50ML
300.0000 mg/m2 | Freq: Once | INTRAVENOUS | Status: AC
  Administered 2013-05-09: 550 mg via INTRAVENOUS
  Filled 2013-05-09: qty 11

## 2013-05-09 NOTE — Patient Instructions (Signed)
[facility] Discharge Instructions for Patients Receiving Chemotherapy  Today you received the following chemotherapy agents Leucovorin, Fluorouracil.  To help prevent nausea and vomiting after your treatment, we encourage you to take your nausea medication as prescribed.   If you develop nausea and vomiting that is not controlled by your nausea medication, call the clinic.   BELOW ARE SYMPTOMS THAT SHOULD BE REPORTED IMMEDIATELY:  *FEVER GREATER THAN 100.5 F  *CHILLS WITH OR WITHOUT FEVER  NAUSEA AND VOMITING THAT IS NOT CONTROLLED WITH YOUR NAUSEA MEDICATION  *UNUSUAL SHORTNESS OF BREATH  *UNUSUAL BRUISING OR BLEEDING  TENDERNESS IN MOUTH AND THROAT WITH OR WITHOUT PRESENCE OF ULCERS  *URINARY PROBLEMS  *BOWEL PROBLEMS  UNUSUAL RASH Items with * indicate a potential emergency and should be followed up as soon as possible.  Feel free to call the clinic should you have any questions or concerns. The clinic phone number is [phone].  It was my pleasure to take care of you today! Leeanne Rio, RN

## 2013-05-09 NOTE — Progress Notes (Signed)
OFFICE PROGRESS NOTE  Interval history:  [name] returns for followup of metastatic gastric cancer. Following his last visit 04/25/2013 Xeloda was placed on hold due to progressive hand-foot syndrome. He notes less pain and swelling over the hands and feet. He reports increased mobility of the fingers. He has a good appetite. No nausea or vomiting. No diarrhea. No mouth sores.   Objective: Filed Vitals:   05/09/13 0854  BP: 139/84  Pulse: 72  Temp: 97.8 F (36.6 C)  Resp: 18   Oropharynx is without thrush or ulceration. Lungs are clear. Regular cardiac rhythm. Port-A-Cath site without erythema. Abdomen soft and nontender. No hepatomegaly. No leg edema. Palms with mild erythema. No skin breakdown. Soles without erythema or skin breakdown.   Lab Results: Lab Results  Component Value Date   WBC 6.0 05/09/2013   HGB 10.7* 05/09/2013   HCT 33.9* 05/09/2013   MCV 78.2* 05/09/2013   PLT 220 05/09/2013   NEUTROABS 3.2 05/09/2013    Chemistry:    Chemistry      Component Value Date/Time   NA 143 05/09/2013 0845   NA 138 11/10/2012 0627   K 4.1 05/09/2013 0845   K 4.1 11/10/2012 0627   CL 104 11/10/2012 0627   CL 103 09/06/2012 1034   CO2 25 05/09/2013 0845   CO2 26 11/10/2012 0627   BUN 12.2 05/09/2013 0845   BUN 9 11/10/2012 0627   CREATININE 0.9 05/09/2013 0845   CREATININE 0.74 11/10/2012 0627      Component Value Date/Time   CALCIUM 9.5 05/09/2013 0845   CALCIUM 9.3 11/10/2012 0627   ALKPHOS 71 05/09/2013 0845   ALKPHOS 54 07/17/2012 1528   AST 17 05/09/2013 0845   AST 18 07/17/2012 1528   ALT 13 05/09/2013 0845   ALT 15 07/17/2012 1528   BILITOT 0.39 05/09/2013 0845   BILITOT 0.4 07/17/2012 1528       Studies/Results: No results found.  Medications: I have reviewed the patient's current medications.  Assessment/Plan: 1.Metastatic gastric cancer-status post biopsy of a gastric mass on 07/20/2012 with the pathology confirming adenocarcinoma, HER-2 non-amplified.  - CT 07/18/2012  consistent with a primary gastric mass and metastatic omental/peritoneal nodules  -biopsy of a peritoneal nodule 07/31/2012 consistent with metastatic adenocarcinoma  -Cycle 1 of FOLFOX on 08/02/2012  -Restaging CT 10/23/2012 consistent with a partial response-decrease in the primary gastric tumor and omental/peritoneal implants  -cycle 10 of FOLFOX completed 01/11/2013  -normal CEA 01/11/2013  -Restaging CT 01/23/2013 with improvement in the peritoneal metastases and an increased size of an indeterminate splenic lesion  -Initiation of maintenance Xeloda therapy on a 7 day on/7 day off schedule 01/29/2013.  -Xeloda dose reduced to 1000 mg twice daily 7 days on/7 days off beginning 02/26/2013 due to nausea and diarrhea.  -Xeloda dose reduced to 500 mg twice daily, 7 days/14 days off 03/20/2013.  -Xeloda discontinued 04/25/2013 due to progressive hand foot syndrome. -CEA elevated at 9.1 on 04/25/2013. 3. Multiple colon polyps documented on a colonoscopy 06/22/2012-tubular adenomas, tubulovillous adenomas, and hyperplastic polyps  4. Family history of colon cancer (mother)  38. Pain secondary to the primary gastric mass or carcinomatosis -improved  6. 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 Enrique Sack, multiple extractions were recommended  7. Anorexia, nausea, and early satiety-improved  8. 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 Nausea following chemotherapy-improved with the addition of prophylactic Decadron  10. Skin rash-most likely secondary to Decadron, improved  11. History of  Neutropenia secondary to chemotherapy  12. 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  14. Nausea and diarrhea while treated with Xeloda. Improved.  15. Hand-foot syndrome 03/07/2013 manifesting with  erythema, dry desquamation and pain on the palms and erythema and pain on the soles. Progressive hand pain 04/25/2013. Xeloda discontinued.  Dispositon-the symptoms of hand-foot syndrome are better. Xeloda has been discontinued. Plan to proceed with infusional 5-FU as per the FOLFOX regimen today as scheduled.  The CEA was elevated on 04/25/2013. Repeat value from today is pending.  He will return for a followup visit and infusional 5-FU in 2 weeks. He will contact the office in the interim with any problems.   Plan reviewed with Dr. Benay Spice.   Ned Card ANP/GNP-BC

## 2013-05-09 NOTE — Telephone Encounter (Signed)
gv and printed papt sched and avs forpt for Feb and March...sed added tx.

## 2013-05-11 ENCOUNTER — Encounter (INDEPENDENT_AMBULATORY_CARE_PROVIDER_SITE_OTHER): Payer: Self-pay

## 2013-05-11 ENCOUNTER — Ambulatory Visit (HOSPITAL_BASED_OUTPATIENT_CLINIC_OR_DEPARTMENT_OTHER): Payer: BC Managed Care – PPO

## 2013-05-11 VITALS — BP 119/81 | HR 78 | Temp 98.3°F

## 2013-05-11 DIAGNOSIS — C169 Malignant neoplasm of stomach, unspecified: Secondary | ICD-10-CM

## 2013-05-11 DIAGNOSIS — Z452 Encounter for adjustment and management of vascular access device: Secondary | ICD-10-CM

## 2013-05-11 MED ORDER — SODIUM CHLORIDE 0.9 % IJ SOLN
10.0000 mL | INTRAMUSCULAR | Status: DC | PRN
  Administered 2013-05-11: 10 mL
  Filled 2013-05-11: qty 10

## 2013-05-11 MED ORDER — HEPARIN SOD (PORK) LOCK FLUSH 100 UNIT/ML IV SOLN
500.0000 [IU] | Freq: Once | INTRAVENOUS | Status: AC | PRN
  Administered 2013-05-11: 500 [IU]
  Filled 2013-05-11: qty 5

## 2013-05-20 ENCOUNTER — Other Ambulatory Visit: Payer: Self-pay | Admitting: Oncology

## 2013-05-24 ENCOUNTER — Ambulatory Visit (HOSPITAL_BASED_OUTPATIENT_CLINIC_OR_DEPARTMENT_OTHER): Payer: BC Managed Care – PPO | Admitting: Nurse Practitioner

## 2013-05-24 ENCOUNTER — Inpatient Hospital Stay: Payer: BC Managed Care – PPO

## 2013-05-24 ENCOUNTER — Other Ambulatory Visit (HOSPITAL_BASED_OUTPATIENT_CLINIC_OR_DEPARTMENT_OTHER): Payer: BC Managed Care – PPO

## 2013-05-24 ENCOUNTER — Telehealth: Payer: Self-pay | Admitting: Oncology

## 2013-05-24 VITALS — BP 127/75 | HR 74 | Temp 98.4°F | Resp 18 | Ht 63.0 in | Wt 162.0 lb

## 2013-05-24 DIAGNOSIS — C786 Secondary malignant neoplasm of retroperitoneum and peritoneum: Secondary | ICD-10-CM

## 2013-05-24 DIAGNOSIS — G620 Drug-induced polyneuropathy: Secondary | ICD-10-CM

## 2013-05-24 DIAGNOSIS — C169 Malignant neoplasm of stomach, unspecified: Secondary | ICD-10-CM

## 2013-05-24 LAB — COMPREHENSIVE METABOLIC PANEL (CC13)
ALT: 12 U/L (ref 0–55)
ANION GAP: 7 meq/L (ref 3–11)
AST: 15 U/L (ref 5–34)
Albumin: 3.8 g/dL (ref 3.5–5.0)
Alkaline Phosphatase: 75 U/L (ref 40–150)
BUN: 10.6 mg/dL (ref 7.0–26.0)
CO2: 26 meq/L (ref 22–29)
CREATININE: 0.9 mg/dL (ref 0.7–1.3)
Calcium: 9.5 mg/dL (ref 8.4–10.4)
Chloride: 109 mEq/L (ref 98–109)
Glucose: 115 mg/dl (ref 70–140)
Potassium: 4.1 mEq/L (ref 3.5–5.1)
Sodium: 142 mEq/L (ref 136–145)
Total Bilirubin: 0.47 mg/dL (ref 0.20–1.20)
Total Protein: 7.1 g/dL (ref 6.4–8.3)

## 2013-05-24 LAB — CBC WITH DIFFERENTIAL/PLATELET
BASO%: 1.4 % (ref 0.0–2.0)
Basophils Absolute: 0.1 10*3/uL (ref 0.0–0.1)
EOS%: 4.1 % (ref 0.0–7.0)
Eosinophils Absolute: 0.2 10*3/uL (ref 0.0–0.5)
HCT: 33.4 % — ABNORMAL LOW (ref 38.4–49.9)
HGB: 10.5 g/dL — ABNORMAL LOW (ref 13.0–17.1)
LYMPH%: 31.6 % (ref 14.0–49.0)
MCH: 24.6 pg — ABNORMAL LOW (ref 27.2–33.4)
MCHC: 31.5 g/dL — ABNORMAL LOW (ref 32.0–36.0)
MCV: 77.8 fL — ABNORMAL LOW (ref 79.3–98.0)
MONO#: 0.5 10*3/uL (ref 0.1–0.9)
MONO%: 10.5 % (ref 0.0–14.0)
NEUT%: 52.4 % (ref 39.0–75.0)
NEUTROS ABS: 2.7 10*3/uL (ref 1.5–6.5)
Platelets: 242 10*3/uL (ref 140–400)
RBC: 4.29 10*6/uL (ref 4.20–5.82)
RDW: 22.2 % — AB (ref 11.0–14.6)
WBC: 5.1 10*3/uL (ref 4.0–10.3)
lymph#: 1.6 10*3/uL (ref 0.9–3.3)

## 2013-05-24 LAB — CEA: CEA: 7.4 ng/mL — ABNORMAL HIGH (ref 0.0–5.0)

## 2013-05-24 NOTE — Telephone Encounter (Signed)
gv adn printed appt scheda dna vs for pt for March and April....sed added tx. °

## 2013-05-24 NOTE — Progress Notes (Signed)
OFFICE PROGRESS NOTE  Interval history:  Joseph Salinas returns for followup of metastatic gastric cancer. He completed a cycle of infusional 5-FU as per the FOLFOX regimen on 05/09/2013. He denies nausea/vomiting. No mouth sores. No diarrhea. Hand and foot discomfort continue to improve. He has increased mobility of the fingers and significantly less pain. He has a good appetite.   Objective: Filed Vitals:   05/24/13 0958  BP: 127/75  Pulse: 74  Temp: 98.4 F (36.9 C)  Resp: 18   Oropharynx is without thrush or ulceration. Lungs are clear. Regular cardiac rhythm. Port-A-Cath site without erythema. Abdomen soft and nontender. No hepatomegaly. No leg edema. Palms with mild erythema. No skin breakdown. No edema.   Lab Results: Lab Results  Component Value Date   WBC 5.1 05/24/2013   HGB 10.5* 05/24/2013   HCT 33.4* 05/24/2013   MCV 77.8* 05/24/2013   PLT 242 05/24/2013   NEUTROABS 2.7 05/24/2013    Chemistry:    Chemistry      Component Value Date/Time   NA 142 05/24/2013 0930   NA 138 11/10/2012 0627   K 4.1 05/24/2013 0930   K 4.1 11/10/2012 0627   CL 104 11/10/2012 0627   CL 103 09/06/2012 1034   CO2 26 05/24/2013 0930   CO2 26 11/10/2012 0627   BUN 10.6 05/24/2013 0930   BUN 9 11/10/2012 0627   CREATININE 0.9 05/24/2013 0930   CREATININE 0.74 11/10/2012 0627      Component Value Date/Time   CALCIUM 9.5 05/24/2013 0930   CALCIUM 9.3 11/10/2012 0627   ALKPHOS 75 05/24/2013 0930   ALKPHOS 54 07/17/2012 1528   AST 15 05/24/2013 0930   AST 18 07/17/2012 1528   ALT 12 05/24/2013 0930   ALT 15 07/17/2012 1528   BILITOT 0.47 05/24/2013 0930   BILITOT 0.4 07/17/2012 1528       Studies/Results: No results found.  Medications: I have reviewed the patient's current medications.  Assessment/Plan: 1.Metastatic gastric cancer-status post biopsy of a gastric mass on 07/20/2012 with the pathology confirming adenocarcinoma, HER-2 non-amplified.  - CT 07/18/2012 consistent with a primary gastric mass and  metastatic omental/peritoneal nodules  -biopsy of a peritoneal nodule 07/31/2012 consistent with metastatic adenocarcinoma  -Cycle 1 of FOLFOX on 08/02/2012  -Restaging CT 10/23/2012 consistent with a partial response-decrease in the primary gastric tumor and omental/peritoneal implants  -cycle 10 of FOLFOX completed 01/11/2013  -normal CEA 01/11/2013  -Restaging CT 01/23/2013 with improvement in the peritoneal metastases and an increased size of an indeterminate splenic lesion  -Initiation of maintenance Xeloda therapy on a 7 day on/7 day off schedule 01/29/2013.  -Xeloda dose reduced to 1000 mg twice daily 7 days on/7 days off beginning 02/26/2013 due to nausea and diarrhea.  -Xeloda dose reduced to 500 mg twice daily, 7 days/14 days off 03/20/2013.  -Xeloda discontinued 04/25/2013 due to progressive hand foot syndrome.  -CEA elevated at 9.1 on 04/25/2013.  -CEA stable at 9.5 on 05/09/2013. -Initiation of infusional 5-FU as per the FOLFOX regimen 05/09/2013. 3. Multiple colon polyps documented on a colonoscopy 06/22/2012-tubular adenomas, tubulovillous adenomas, and hyperplastic polyps  4. Family history of colon cancer (mother)  43. Pain secondary to the primary gastric mass or carcinomatosis -improved  6. 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 Enrique Sack, multiple extractions were recommended  7. Anorexia, nausea, and early satiety-improved  8. 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 Nausea following chemotherapy-improved with the addition of prophylactic Decadron  10. Skin rash-most likely secondary to Decadron, improved  11. History of neutropenia secondary to chemotherapy  12. 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  14. Nausea and diarrhea while treated with Xeloda.  Improved.  15. Hand-foot syndrome 03/07/2013 manifesting with erythema, dry desquamation and pain on the palms and erythema and pain on the soles. Progressive hand pain 04/25/2013. Xeloda discontinued.  Dispositon-he appears stable. Hand-foot syndrome continues to improve. Plan to continue infusional 5-FU as per the FOLFOX regimen. We recommended giving the chemotherapy on a 2 week schedule. Mr. Ardolino requests treatment on a 3 week schedule. We will hold today's chemotherapy and reschedule for one week (05/31/2013). He will return for a followup visit and chemotherapy on 06/21/2013. He will contact the office in the interim with any problems.  Plan reviewed with Dr. Benay Spice.   Ned Card ANP/GNP-BC

## 2013-05-29 ENCOUNTER — Encounter: Payer: Self-pay | Admitting: *Deleted

## 2013-05-31 ENCOUNTER — Ambulatory Visit (HOSPITAL_BASED_OUTPATIENT_CLINIC_OR_DEPARTMENT_OTHER): Payer: BC Managed Care – PPO

## 2013-05-31 ENCOUNTER — Other Ambulatory Visit (HOSPITAL_BASED_OUTPATIENT_CLINIC_OR_DEPARTMENT_OTHER): Payer: BC Managed Care – PPO

## 2013-05-31 VITALS — BP 136/83 | HR 70 | Temp 97.8°F | Resp 18

## 2013-05-31 DIAGNOSIS — Z5111 Encounter for antineoplastic chemotherapy: Secondary | ICD-10-CM

## 2013-05-31 DIAGNOSIS — C169 Malignant neoplasm of stomach, unspecified: Secondary | ICD-10-CM

## 2013-05-31 LAB — CBC WITH DIFFERENTIAL/PLATELET
BASO%: 2 % (ref 0.0–2.0)
BASOS ABS: 0.1 10*3/uL (ref 0.0–0.1)
EOS ABS: 0.3 10*3/uL (ref 0.0–0.5)
EOS%: 5.4 % (ref 0.0–7.0)
HCT: 33.9 % — ABNORMAL LOW (ref 38.4–49.9)
HGB: 10.7 g/dL — ABNORMAL LOW (ref 13.0–17.1)
LYMPH%: 38.3 % (ref 14.0–49.0)
MCH: 24.6 pg — ABNORMAL LOW (ref 27.2–33.4)
MCHC: 31.7 g/dL — ABNORMAL LOW (ref 32.0–36.0)
MCV: 77.6 fL — AB (ref 79.3–98.0)
MONO#: 0.7 10*3/uL (ref 0.1–0.9)
MONO%: 13 % (ref 0.0–14.0)
NEUT%: 41.3 % (ref 39.0–75.0)
NEUTROS ABS: 2.2 10*3/uL (ref 1.5–6.5)
Platelets: 223 10*3/uL (ref 140–400)
RBC: 4.37 10*6/uL (ref 4.20–5.82)
RDW: 21.5 % — AB (ref 11.0–14.6)
WBC: 5.2 10*3/uL (ref 4.0–10.3)
lymph#: 2 10*3/uL (ref 0.9–3.3)

## 2013-05-31 MED ORDER — LEUCOVORIN CALCIUM INJECTION 350 MG
300.0000 mg/m2 | Freq: Once | INTRAVENOUS | Status: AC
  Administered 2013-05-31: 540 mg via INTRAVENOUS
  Filled 2013-05-31: qty 27

## 2013-05-31 MED ORDER — SODIUM CHLORIDE 0.9 % IV SOLN
1920.0000 mg/m2 | INTRAVENOUS | Status: DC
  Administered 2013-05-31: 3450 mg via INTRAVENOUS
  Filled 2013-05-31: qty 69

## 2013-05-31 MED ORDER — PROCHLORPERAZINE MALEATE 10 MG PO TABS
ORAL_TABLET | ORAL | Status: AC
  Filled 2013-05-31: qty 1

## 2013-05-31 MED ORDER — PROCHLORPERAZINE MALEATE 10 MG PO TABS
10.0000 mg | ORAL_TABLET | Freq: Once | ORAL | Status: AC
  Administered 2013-05-31: 10 mg via ORAL

## 2013-05-31 MED ORDER — FLUOROURACIL CHEMO INJECTION 2.5 GM/50ML
300.0000 mg/m2 | Freq: Once | INTRAVENOUS | Status: AC
  Administered 2013-05-31: 550 mg via INTRAVENOUS
  Filled 2013-05-31: qty 11

## 2013-05-31 MED ORDER — SODIUM CHLORIDE 0.9 % IV SOLN
Freq: Once | INTRAVENOUS | Status: AC
  Administered 2013-05-31: 09:00:00 via INTRAVENOUS

## 2013-05-31 NOTE — Progress Notes (Signed)
Patient c/o small, pink non-tender sore to upper left lip. Ned Card, NP notified. To monitor for now. Patient instructed to call clinic if this area becomes more painful or if sores develop inside his mouth or he is unable to eat or drink because of this. Patient verbalizes understanding.

## 2013-05-31 NOTE — Patient Instructions (Signed)
[facility] Discharge Instructions for Patients Receiving Chemotherapy  Today you received the following chemotherapy agents: Leucovorin, 5FU  To help prevent nausea and vomiting after your treatment, we encourage you to take your nausea medication as prescribed.    If you develop nausea and vomiting that is not controlled by your nausea medication, call the clinic.   BELOW ARE SYMPTOMS THAT SHOULD BE REPORTED IMMEDIATELY:  *FEVER GREATER THAN 100.5 F  *CHILLS WITH OR WITHOUT FEVER  NAUSEA AND VOMITING THAT IS NOT CONTROLLED WITH YOUR NAUSEA MEDICATION  *UNUSUAL SHORTNESS OF BREATH  *UNUSUAL BRUISING OR BLEEDING  TENDERNESS IN MOUTH AND THROAT WITH OR WITHOUT PRESENCE OF ULCERS  *URINARY PROBLEMS  *BOWEL PROBLEMS  UNUSUAL RASH Items with * indicate a potential emergency and should be followed up as soon as possible.  Feel free to call the clinic you have any questions or concerns. The clinic phone number is [phone].    

## 2013-06-01 ENCOUNTER — Other Ambulatory Visit: Payer: Self-pay | Admitting: Oncology

## 2013-06-02 ENCOUNTER — Ambulatory Visit (HOSPITAL_BASED_OUTPATIENT_CLINIC_OR_DEPARTMENT_OTHER): Payer: BC Managed Care – PPO

## 2013-06-02 VITALS — BP 134/90 | HR 84 | Temp 98.0°F

## 2013-06-02 DIAGNOSIS — C169 Malignant neoplasm of stomach, unspecified: Secondary | ICD-10-CM

## 2013-06-02 MED ORDER — SODIUM CHLORIDE 0.9 % IJ SOLN
10.0000 mL | INTRAMUSCULAR | Status: DC | PRN
  Administered 2013-06-02: 10 mL
  Filled 2013-06-02: qty 10

## 2013-06-02 MED ORDER — HEPARIN SOD (PORK) LOCK FLUSH 100 UNIT/ML IV SOLN
500.0000 [IU] | Freq: Once | INTRAVENOUS | Status: AC | PRN
  Administered 2013-06-02: 500 [IU]
  Filled 2013-06-02: qty 5

## 2013-06-12 ENCOUNTER — Encounter: Payer: Self-pay | Admitting: Oncology

## 2013-06-12 NOTE — Progress Notes (Signed)
Called and spoke with wife [name] and confirmed he still has [employer].

## 2013-06-17 ENCOUNTER — Other Ambulatory Visit: Payer: Self-pay | Admitting: Oncology

## 2013-06-20 ENCOUNTER — Other Ambulatory Visit: Payer: Self-pay

## 2013-06-21 ENCOUNTER — Other Ambulatory Visit (HOSPITAL_BASED_OUTPATIENT_CLINIC_OR_DEPARTMENT_OTHER): Payer: BC Managed Care – PPO

## 2013-06-21 ENCOUNTER — Telehealth: Payer: Self-pay | Admitting: Oncology

## 2013-06-21 ENCOUNTER — Other Ambulatory Visit: Payer: Self-pay

## 2013-06-21 ENCOUNTER — Ambulatory Visit (HOSPITAL_BASED_OUTPATIENT_CLINIC_OR_DEPARTMENT_OTHER): Payer: BC Managed Care – PPO | Admitting: Oncology

## 2013-06-21 ENCOUNTER — Ambulatory Visit (HOSPITAL_BASED_OUTPATIENT_CLINIC_OR_DEPARTMENT_OTHER): Payer: BC Managed Care – PPO

## 2013-06-21 VITALS — BP 132/85 | HR 65 | Temp 97.4°F | Resp 18 | Ht 63.0 in | Wt 163.9 lb

## 2013-06-21 DIAGNOSIS — G620 Drug-induced polyneuropathy: Secondary | ICD-10-CM

## 2013-06-21 DIAGNOSIS — C169 Malignant neoplasm of stomach, unspecified: Secondary | ICD-10-CM

## 2013-06-21 DIAGNOSIS — C786 Secondary malignant neoplasm of retroperitoneum and peritoneum: Secondary | ICD-10-CM

## 2013-06-21 DIAGNOSIS — Z5111 Encounter for antineoplastic chemotherapy: Secondary | ICD-10-CM

## 2013-06-21 DIAGNOSIS — R970 Elevated carcinoembryonic antigen [CEA]: Secondary | ICD-10-CM

## 2013-06-21 LAB — CBC WITH DIFFERENTIAL/PLATELET
BASO%: 2.2 % — AB (ref 0.0–2.0)
Basophils Absolute: 0.1 10*3/uL (ref 0.0–0.1)
EOS%: 2.8 % (ref 0.0–7.0)
Eosinophils Absolute: 0.1 10*3/uL (ref 0.0–0.5)
HCT: 34 % — ABNORMAL LOW (ref 38.4–49.9)
HGB: 10.8 g/dL — ABNORMAL LOW (ref 13.0–17.1)
LYMPH%: 32.6 % (ref 14.0–49.0)
MCH: 24.2 pg — ABNORMAL LOW (ref 27.2–33.4)
MCHC: 31.7 g/dL — AB (ref 32.0–36.0)
MCV: 76.5 fL — AB (ref 79.3–98.0)
MONO#: 0.7 10*3/uL (ref 0.1–0.9)
MONO%: 15.3 % — AB (ref 0.0–14.0)
NEUT%: 47.1 % (ref 39.0–75.0)
NEUTROS ABS: 2.1 10*3/uL (ref 1.5–6.5)
Platelets: 224 10*3/uL (ref 140–400)
RBC: 4.45 10*6/uL (ref 4.20–5.82)
RDW: 20.3 % — AB (ref 11.0–14.6)
WBC: 4.5 10*3/uL (ref 4.0–10.3)
lymph#: 1.5 10*3/uL (ref 0.9–3.3)

## 2013-06-21 LAB — COMPREHENSIVE METABOLIC PANEL (CC13)
ALBUMIN: 3.9 g/dL (ref 3.5–5.0)
ALK PHOS: 77 U/L (ref 40–150)
ALT: 12 U/L (ref 0–55)
AST: 15 U/L (ref 5–34)
Anion Gap: 9 mEq/L (ref 3–11)
BUN: 11.4 mg/dL (ref 7.0–26.0)
CO2: 24 mEq/L (ref 22–29)
Calcium: 9.3 mg/dL (ref 8.4–10.4)
Chloride: 107 mEq/L (ref 98–109)
Creatinine: 0.9 mg/dL (ref 0.7–1.3)
GLUCOSE: 98 mg/dL (ref 70–140)
POTASSIUM: 4 meq/L (ref 3.5–5.1)
SODIUM: 140 meq/L (ref 136–145)
TOTAL PROTEIN: 7.3 g/dL (ref 6.4–8.3)
Total Bilirubin: 0.45 mg/dL (ref 0.20–1.20)

## 2013-06-21 MED ORDER — DEXTROSE 5 % IV SOLN
300.0000 mg/m2 | Freq: Once | INTRAVENOUS | Status: AC
  Administered 2013-06-21: 540 mg via INTRAVENOUS
  Filled 2013-06-21: qty 27

## 2013-06-21 MED ORDER — SODIUM CHLORIDE 0.9 % IV SOLN
1920.0000 mg/m2 | INTRAVENOUS | Status: DC
  Administered 2013-06-21: 3450 mg via INTRAVENOUS
  Filled 2013-06-21: qty 69

## 2013-06-21 MED ORDER — SODIUM CHLORIDE 0.9 % IV SOLN
Freq: Once | INTRAVENOUS | Status: AC
  Administered 2013-06-21: 11:00:00 via INTRAVENOUS

## 2013-06-21 MED ORDER — FLUOROURACIL CHEMO INJECTION 2.5 GM/50ML
300.0000 mg/m2 | Freq: Once | INTRAVENOUS | Status: AC
  Administered 2013-06-21: 550 mg via INTRAVENOUS
  Filled 2013-06-21: qty 11

## 2013-06-21 MED ORDER — PROCHLORPERAZINE MALEATE 10 MG PO TABS
10.0000 mg | ORAL_TABLET | Freq: Once | ORAL | Status: AC
  Administered 2013-06-21: 10 mg via ORAL

## 2013-06-21 MED ORDER — PROCHLORPERAZINE MALEATE 10 MG PO TABS
ORAL_TABLET | ORAL | Status: AC
  Filled 2013-06-21: qty 1

## 2013-06-21 NOTE — Progress Notes (Addendum)
[place] OFFICE PROGRESS NOTE   Diagnosis: Metastatic gastric cancer  INTERVAL HISTORY:   Joseph Salinas completed another treatment with 5-fluorouracil on 05/31/2013. He tolerated chemotherapy well. No mouth sores, diarrhea, or hand/foot pain. He continues to have neuropathy symptoms in the hands and feet. This has improved partially. Good appetite.  Objective:  Vital signs in last 24 hours:  Blood pressure 132/85, pulse 65, temperature 97.4 F (36.3 C), temperature source Oral, resp. rate 18, height _0  (1.6 m), weight 163 lb 14.4 oz (74.345 kg), SpO2 100.00%.    HEENT: No thrush or ulcers Resp: Lungs clear bilaterally Cardio: Regular rate and rhythm GI: No hepatomegaly, nontender, no mass Vascular: No leg edema Skin: Hyperpigmentation of the hands and feet, no skin breakdown  Portacath/PICC-without erythema  Lab Results:  Lab Results  Component Value Date   WBC 4.5 06/21/2013   HGB 10.8* 06/21/2013   HCT 34.0* 06/21/2013   MCV 76.5* 06/21/2013   PLT 224 06/21/2013   NEUTROABS 2.1 06/21/2013      Lab Results  Component Value Date   CEA 7.4* 05/24/2013   Medications: I have reviewed the patient's current medications.  Assessment/Plan: 1.Metastatic gastric cancer-status post biopsy of a gastric mass on 07/20/2012 with the pathology confirming adenocarcinoma, HER-2 non-amplified.  - CT 07/18/2012 consistent with a primary gastric mass and metastatic omental/peritoneal nodules  -biopsy of a peritoneal nodule 07/31/2012 consistent with metastatic adenocarcinoma  -Cycle 1 of FOLFOX on 08/02/2012  -Restaging CT 10/23/2012 consistent with a partial response-decrease in the primary gastric tumor and omental/peritoneal implants  -cycle 10 of FOLFOX completed 01/11/2013  -normal CEA 01/11/2013  -Restaging CT 01/23/2013 with improvement in the peritoneal metastases and an increased size of an indeterminate splenic lesion  -Initiation of maintenance Xeloda therapy on  a 7 day on/7 day off schedule 01/29/2013.  -Xeloda dose reduced to 1000 mg twice daily 7 days on/7 days off beginning 02/26/2013 due to nausea and diarrhea.  -Xeloda dose reduced to 500 mg twice daily, 7 days/14 days off 03/20/2013.  -Xeloda discontinued 04/25/2013 due to progressive hand foot syndrome.  -CEA elevated at 9.1 on 04/25/2013.  -CEA stable at 9.5 on 05/09/2013.  -Initiation of infusional 5-FU as per the FOLFOX regimen 05/09/2013.  3. Multiple colon polyps documented on a colonoscopy 06/22/2012-tubular adenomas, tubulovillous adenomas, and hyperplastic polyps  4. Family history of colon cancer (mother)  22. Pain secondary to the primary gastric mass or carcinomatosis -improved  6. 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 Enrique Sack, multiple extractions were recommended  7. Anorexia, nausea, and early satiety-improved  8. 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 Nausea following chemotherapy-improved with the addition of prophylactic Decadron  10. Skin rash-most likely secondary to Decadron, improved  11. History of neutropenia secondary to chemotherapy  12. 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  14. Nausea and diarrhea while treated with Xeloda. Improved.  15. Hand-foot syndrome 03/07/2013 manifesting with erythema, dry desquamation and pain on the palms and erythema and pain on the soles. Progressive hand pain 04/25/2013. Xeloda discontinued.    Disposition:  He appears to be tolerating the 5-fluorouracil well. The plan is to continue 5-fluorouracil on a 3 week schedule. The CEA was slightly elevated 05/24/2013. We will followup on the CEA from today. Joseph Salinas will return for chemotherapy in 3 weeks. He is scheduled for an  office visit in 6 weeks.  Betsy Coder,  MD  06/21/2013  5:59 PM

## 2013-06-21 NOTE — Patient Instructions (Signed)
[facility] Discharge Instructions for Patients Receiving Chemotherapy  Today you received the following chemotherapy agents: Leucovorin, Adrucil (5FU)  To help prevent nausea and vomiting after your treatment, we encourage you to take your nausea medication: Compazine 10 mg every 6 hrs as needed.    If you develop nausea and vomiting that is not controlled by your nausea medication, call the clinic.   BELOW ARE SYMPTOMS THAT SHOULD BE REPORTED IMMEDIATELY:  *FEVER GREATER THAN 100.5 F  *CHILLS WITH OR WITHOUT FEVER  NAUSEA AND VOMITING THAT IS NOT CONTROLLED WITH YOUR NAUSEA MEDICATION  *UNUSUAL SHORTNESS OF BREATH  *UNUSUAL BRUISING OR BLEEDING  TENDERNESS IN MOUTH AND THROAT WITH OR WITHOUT PRESENCE OF ULCERS  *URINARY PROBLEMS  *BOWEL PROBLEMS  UNUSUAL RASH Items with * indicate a potential emergency and should be followed up as soon as possible.  Feel free to call the clinic you have any questions or concerns. The clinic phone number is [phone].

## 2013-06-21 NOTE — Telephone Encounter (Signed)
gv pt appt schedule for april/may °

## 2013-06-21 NOTE — Progress Notes (Signed)
Labs for today reviewed, within treatment parameters. Patient expressing no concerns at this time. Infusion completed without difficulty.

## 2013-06-22 LAB — CEA: CEA: 7.9 ng/mL — ABNORMAL HIGH (ref 0.0–5.0)

## 2013-06-23 ENCOUNTER — Ambulatory Visit (HOSPITAL_BASED_OUTPATIENT_CLINIC_OR_DEPARTMENT_OTHER): Payer: BC Managed Care – PPO

## 2013-06-23 VITALS — BP 125/87 | HR 68 | Temp 97.9°F | Resp 20

## 2013-06-23 DIAGNOSIS — Z452 Encounter for adjustment and management of vascular access device: Secondary | ICD-10-CM

## 2013-06-23 DIAGNOSIS — C169 Malignant neoplasm of stomach, unspecified: Secondary | ICD-10-CM

## 2013-06-23 DIAGNOSIS — C786 Secondary malignant neoplasm of retroperitoneum and peritoneum: Secondary | ICD-10-CM

## 2013-06-23 MED ORDER — HEPARIN SOD (PORK) LOCK FLUSH 100 UNIT/ML IV SOLN
500.0000 [IU] | Freq: Once | INTRAVENOUS | Status: AC | PRN
  Administered 2013-06-23: 500 [IU]
  Filled 2013-06-23: qty 5

## 2013-06-23 MED ORDER — SODIUM CHLORIDE 0.9 % IJ SOLN
10.0000 mL | INTRAMUSCULAR | Status: DC | PRN
  Administered 2013-06-23: 10 mL
  Filled 2013-06-23: qty 10

## 2013-07-11 ENCOUNTER — Other Ambulatory Visit: Payer: Self-pay | Admitting: Oncology

## 2013-07-11 ENCOUNTER — Other Ambulatory Visit (HOSPITAL_BASED_OUTPATIENT_CLINIC_OR_DEPARTMENT_OTHER): Payer: BC Managed Care – PPO

## 2013-07-11 ENCOUNTER — Ambulatory Visit (HOSPITAL_BASED_OUTPATIENT_CLINIC_OR_DEPARTMENT_OTHER): Payer: BC Managed Care – PPO

## 2013-07-11 VITALS — BP 131/85 | HR 75 | Temp 97.9°F

## 2013-07-11 DIAGNOSIS — Z5111 Encounter for antineoplastic chemotherapy: Secondary | ICD-10-CM

## 2013-07-11 DIAGNOSIS — C169 Malignant neoplasm of stomach, unspecified: Secondary | ICD-10-CM

## 2013-07-11 DIAGNOSIS — C786 Secondary malignant neoplasm of retroperitoneum and peritoneum: Secondary | ICD-10-CM

## 2013-07-11 LAB — CBC WITH DIFFERENTIAL/PLATELET
BASO%: 1.3 % (ref 0.0–2.0)
Basophils Absolute: 0.1 10*3/uL (ref 0.0–0.1)
EOS%: 3.5 % (ref 0.0–7.0)
Eosinophils Absolute: 0.2 10*3/uL (ref 0.0–0.5)
HEMATOCRIT: 33.7 % — AB (ref 38.4–49.9)
HEMOGLOBIN: 10.4 g/dL — AB (ref 13.0–17.1)
LYMPH#: 1.6 10*3/uL (ref 0.9–3.3)
LYMPH%: 31.4 % (ref 14.0–49.0)
MCH: 23.4 pg — AB (ref 27.2–33.4)
MCHC: 30.8 g/dL — ABNORMAL LOW (ref 32.0–36.0)
MCV: 75.7 fL — ABNORMAL LOW (ref 79.3–98.0)
MONO#: 0.8 10*3/uL (ref 0.1–0.9)
MONO%: 14.8 % — ABNORMAL HIGH (ref 0.0–14.0)
NEUT#: 2.5 10*3/uL (ref 1.5–6.5)
NEUT%: 49 % (ref 39.0–75.0)
Platelets: 227 10*3/uL (ref 140–400)
RBC: 4.45 10*6/uL (ref 4.20–5.82)
RDW: 20.8 % — AB (ref 11.0–14.6)
WBC: 5.2 10*3/uL (ref 4.0–10.3)

## 2013-07-11 LAB — COMPREHENSIVE METABOLIC PANEL (CC13)
ALBUMIN: 3.8 g/dL (ref 3.5–5.0)
ALT: 11 U/L (ref 0–55)
ANION GAP: 7 meq/L (ref 3–11)
AST: 15 U/L (ref 5–34)
Alkaline Phosphatase: 76 U/L (ref 40–150)
BUN: 9.4 mg/dL (ref 7.0–26.0)
CALCIUM: 9.1 mg/dL (ref 8.4–10.4)
CHLORIDE: 110 meq/L — AB (ref 98–109)
CO2: 23 meq/L (ref 22–29)
Creatinine: 0.8 mg/dL (ref 0.7–1.3)
Glucose: 108 mg/dl (ref 70–140)
POTASSIUM: 3.8 meq/L (ref 3.5–5.1)
Sodium: 141 mEq/L (ref 136–145)
Total Bilirubin: 0.43 mg/dL (ref 0.20–1.20)
Total Protein: 7.3 g/dL (ref 6.4–8.3)

## 2013-07-11 MED ORDER — SODIUM CHLORIDE 0.9 % IV SOLN
Freq: Once | INTRAVENOUS | Status: AC
  Administered 2013-07-11: 11:00:00 via INTRAVENOUS

## 2013-07-11 MED ORDER — FLUOROURACIL CHEMO INJECTION 2.5 GM/50ML
300.0000 mg/m2 | Freq: Once | INTRAVENOUS | Status: AC
  Administered 2013-07-11: 550 mg via INTRAVENOUS
  Filled 2013-07-11: qty 11

## 2013-07-11 MED ORDER — PROCHLORPERAZINE MALEATE 10 MG PO TABS
ORAL_TABLET | ORAL | Status: AC
  Filled 2013-07-11: qty 1

## 2013-07-11 MED ORDER — LEUCOVORIN CALCIUM INJECTION 350 MG
300.0000 mg/m2 | Freq: Once | INTRAVENOUS | Status: AC
  Administered 2013-07-11: 540 mg via INTRAVENOUS
  Filled 2013-07-11: qty 27

## 2013-07-11 MED ORDER — SODIUM CHLORIDE 0.9 % IV SOLN
1920.0000 mg/m2 | INTRAVENOUS | Status: DC
  Administered 2013-07-11: 3450 mg via INTRAVENOUS
  Filled 2013-07-11: qty 69

## 2013-07-11 MED ORDER — PROCHLORPERAZINE MALEATE 10 MG PO TABS
10.0000 mg | ORAL_TABLET | Freq: Once | ORAL | Status: AC
  Administered 2013-07-11: 10 mg via ORAL

## 2013-07-11 NOTE — Patient Instructions (Signed)
[facility] Discharge Instructions for Patients Receiving Chemotherapy  Today you received the following chemotherapy agents leucovorin/fluorouracil.    To help prevent nausea and vomiting after your treatment, we encourage you to take your nausea medication as directed.     If you develop nausea and vomiting that is not controlled by your nausea medication, call the clinic.   BELOW ARE SYMPTOMS THAT SHOULD BE REPORTED IMMEDIATELY:  *FEVER GREATER THAN 100.5 F  *CHILLS WITH OR WITHOUT FEVER  NAUSEA AND VOMITING THAT IS NOT CONTROLLED WITH YOUR NAUSEA MEDICATION  *UNUSUAL SHORTNESS OF BREATH  *UNUSUAL BRUISING OR BLEEDING  TENDERNESS IN MOUTH AND THROAT WITH OR WITHOUT PRESENCE OF ULCERS  *URINARY PROBLEMS  *BOWEL PROBLEMS  UNUSUAL RASH Items with * indicate a potential emergency and should be followed up as soon as possible.  Feel free to call the clinic you have any questions or concerns. The clinic phone number is [phone].  

## 2013-07-13 ENCOUNTER — Ambulatory Visit (HOSPITAL_BASED_OUTPATIENT_CLINIC_OR_DEPARTMENT_OTHER): Payer: BC Managed Care – PPO

## 2013-07-13 VITALS — BP 133/82 | HR 69 | Temp 97.5°F

## 2013-07-13 DIAGNOSIS — C169 Malignant neoplasm of stomach, unspecified: Secondary | ICD-10-CM

## 2013-07-13 DIAGNOSIS — C787 Secondary malignant neoplasm of liver and intrahepatic bile duct: Secondary | ICD-10-CM

## 2013-07-13 DIAGNOSIS — C786 Secondary malignant neoplasm of retroperitoneum and peritoneum: Secondary | ICD-10-CM

## 2013-07-13 MED ORDER — HEPARIN SOD (PORK) LOCK FLUSH 100 UNIT/ML IV SOLN
500.0000 [IU] | Freq: Once | INTRAVENOUS | Status: AC | PRN
  Administered 2013-07-13: 500 [IU]
  Filled 2013-07-13: qty 5

## 2013-07-13 MED ORDER — SODIUM CHLORIDE 0.9 % IJ SOLN
10.0000 mL | INTRAMUSCULAR | Status: DC | PRN
  Administered 2013-07-13: 10 mL
  Filled 2013-07-13: qty 10

## 2013-08-01 ENCOUNTER — Other Ambulatory Visit: Payer: Self-pay | Admitting: Oncology

## 2013-08-01 ENCOUNTER — Telehealth: Payer: Self-pay | Admitting: *Deleted

## 2013-08-01 ENCOUNTER — Telehealth: Payer: Self-pay | Admitting: Oncology

## 2013-08-01 ENCOUNTER — Other Ambulatory Visit: Payer: Self-pay

## 2013-08-01 ENCOUNTER — Ambulatory Visit (HOSPITAL_BASED_OUTPATIENT_CLINIC_OR_DEPARTMENT_OTHER): Payer: BC Managed Care – PPO | Admitting: Nurse Practitioner

## 2013-08-01 ENCOUNTER — Other Ambulatory Visit (HOSPITAL_BASED_OUTPATIENT_CLINIC_OR_DEPARTMENT_OTHER): Payer: BC Managed Care – PPO

## 2013-08-01 ENCOUNTER — Ambulatory Visit (HOSPITAL_BASED_OUTPATIENT_CLINIC_OR_DEPARTMENT_OTHER): Payer: BC Managed Care – PPO

## 2013-08-01 VITALS — BP 136/84 | HR 73 | Temp 97.3°F | Resp 18 | Ht 63.0 in | Wt 164.3 lb

## 2013-08-01 DIAGNOSIS — K5900 Constipation, unspecified: Secondary | ICD-10-CM

## 2013-08-01 DIAGNOSIS — R6881 Early satiety: Secondary | ICD-10-CM

## 2013-08-01 DIAGNOSIS — C169 Malignant neoplasm of stomach, unspecified: Secondary | ICD-10-CM

## 2013-08-01 DIAGNOSIS — G893 Neoplasm related pain (acute) (chronic): Secondary | ICD-10-CM

## 2013-08-01 DIAGNOSIS — R110 Nausea: Secondary | ICD-10-CM

## 2013-08-01 DIAGNOSIS — G620 Drug-induced polyneuropathy: Secondary | ICD-10-CM

## 2013-08-01 DIAGNOSIS — C786 Secondary malignant neoplasm of retroperitoneum and peritoneum: Secondary | ICD-10-CM

## 2013-08-01 DIAGNOSIS — R21 Rash and other nonspecific skin eruption: Secondary | ICD-10-CM

## 2013-08-01 DIAGNOSIS — R630 Anorexia: Secondary | ICD-10-CM

## 2013-08-01 DIAGNOSIS — Z5111 Encounter for antineoplastic chemotherapy: Secondary | ICD-10-CM

## 2013-08-01 LAB — CBC WITH DIFFERENTIAL/PLATELET
BASO%: 1.5 % (ref 0.0–2.0)
Basophils Absolute: 0.1 10*3/uL (ref 0.0–0.1)
EOS%: 2.9 % (ref 0.0–7.0)
Eosinophils Absolute: 0.1 10*3/uL (ref 0.0–0.5)
HCT: 34.8 % — ABNORMAL LOW (ref 38.4–49.9)
HEMOGLOBIN: 10.6 g/dL — AB (ref 13.0–17.1)
LYMPH%: 30.2 % (ref 14.0–49.0)
MCH: 23.1 pg — AB (ref 27.2–33.4)
MCHC: 30.5 g/dL — ABNORMAL LOW (ref 32.0–36.0)
MCV: 75.8 fL — ABNORMAL LOW (ref 79.3–98.0)
MONO#: 0.7 10*3/uL (ref 0.1–0.9)
MONO%: 13.5 % (ref 0.0–14.0)
NEUT#: 2.5 10*3/uL (ref 1.5–6.5)
NEUT%: 51.9 % (ref 39.0–75.0)
Platelets: 215 10*3/uL (ref 140–400)
RBC: 4.59 10*6/uL (ref 4.20–5.82)
RDW: 19.4 % — AB (ref 11.0–14.6)
WBC: 4.8 10*3/uL (ref 4.0–10.3)
lymph#: 1.5 10*3/uL (ref 0.9–3.3)

## 2013-08-01 LAB — COMPREHENSIVE METABOLIC PANEL (CC13)
ALBUMIN: 3.7 g/dL (ref 3.5–5.0)
ALT: 11 U/L (ref 0–55)
AST: 13 U/L (ref 5–34)
Alkaline Phosphatase: 77 U/L (ref 40–150)
Anion Gap: 11 mEq/L (ref 3–11)
BUN: 9.6 mg/dL (ref 7.0–26.0)
CALCIUM: 9.3 mg/dL (ref 8.4–10.4)
CHLORIDE: 108 meq/L (ref 98–109)
CO2: 22 mEq/L (ref 22–29)
Creatinine: 0.9 mg/dL (ref 0.7–1.3)
Glucose: 116 mg/dl (ref 70–140)
Potassium: 3.9 mEq/L (ref 3.5–5.1)
Sodium: 141 mEq/L (ref 136–145)
Total Bilirubin: 0.57 mg/dL (ref 0.20–1.20)
Total Protein: 7.1 g/dL (ref 6.4–8.3)

## 2013-08-01 LAB — CEA: CEA: 7.8 ng/mL — ABNORMAL HIGH (ref 0.0–5.0)

## 2013-08-01 MED ORDER — PROCHLORPERAZINE MALEATE 10 MG PO TABS
10.0000 mg | ORAL_TABLET | Freq: Once | ORAL | Status: AC
  Administered 2013-08-01: 10 mg via ORAL

## 2013-08-01 MED ORDER — PROCHLORPERAZINE MALEATE 10 MG PO TABS
ORAL_TABLET | ORAL | Status: AC
  Filled 2013-08-01: qty 1

## 2013-08-01 MED ORDER — SODIUM CHLORIDE 0.9 % IV SOLN
Freq: Once | INTRAVENOUS | Status: AC
  Administered 2013-08-01: 11:00:00 via INTRAVENOUS

## 2013-08-01 MED ORDER — FLUOROURACIL CHEMO INJECTION 2.5 GM/50ML
300.0000 mg/m2 | Freq: Once | INTRAVENOUS | Status: AC
  Administered 2013-08-01: 550 mg via INTRAVENOUS
  Filled 2013-08-01: qty 11

## 2013-08-01 MED ORDER — SODIUM CHLORIDE 0.9 % IV SOLN
1920.0000 mg/m2 | INTRAVENOUS | Status: DC
  Administered 2013-08-01: 3450 mg via INTRAVENOUS
  Filled 2013-08-01: qty 69

## 2013-08-01 MED ORDER — LEUCOVORIN CALCIUM INJECTION 350 MG
300.0000 mg/m2 | Freq: Once | INTRAVENOUS | Status: AC
  Administered 2013-08-01: 540 mg via INTRAVENOUS
  Filled 2013-08-01: qty 27

## 2013-08-01 NOTE — Patient Instructions (Signed)
[facility] Discharge Instructions for Patients Receiving Chemotherapy  Today you received the following chemotherapy agents Leucovorin/5FU.  To help prevent nausea and vomiting after your treatment, we encourage you to take your nausea medication as prescribed.   If you develop nausea and vomiting that is not controlled by your nausea medication, call the clinic.   BELOW ARE SYMPTOMS THAT SHOULD BE REPORTED IMMEDIATELY:  *FEVER GREATER THAN 100.5 F  *CHILLS WITH OR WITHOUT FEVER  NAUSEA AND VOMITING THAT IS NOT CONTROLLED WITH YOUR NAUSEA MEDICATION  *UNUSUAL SHORTNESS OF BREATH  *UNUSUAL BRUISING OR BLEEDING  TENDERNESS IN MOUTH AND THROAT WITH OR WITHOUT PRESENCE OF ULCERS  *URINARY PROBLEMS  *BOWEL PROBLEMS  UNUSUAL RASH Items with * indicate a potential emergency and should be followed up as soon as possible.  Feel free to call the clinic you have any questions or concerns. The clinic phone number is [phone].    

## 2013-08-01 NOTE — Telephone Encounter (Signed)
Per staff phone call and POF I have schedueld appts.  JMW  

## 2013-08-01 NOTE — Progress Notes (Signed)
[place] OFFICE PROGRESS NOTE   Diagnosis:  Metastatic gastric cancer.  INTERVAL HISTORY:   Joseph Salinas continues every three-week 5-fluorouracil. He reports tolerating the chemotherapy well. No nausea or vomiting. No mouth sores. No diarrhea. No hand or foot pain or redness. He notes that his hands feel "weak". He denies any numbness. He has a good appetite. No dysphagia. No abdominal pain.  Objective:  Vital signs in last 24 hours:  Blood pressure 136/84, pulse 73, temperature 97.3 F (36.3 C), temperature source Oral, resp. rate 18, height 5' 3"  (1.6 m), weight 164 lb 4.8 oz (74.526 kg), SpO2 100.00%.    HEENT: No thrush or ulcerations. Resp: Lungs clear. Cardio:  Regular cardiac rhythm. GI: Abdomen soft and nontender. No hepatomegaly. No mass. Vascular: No leg edema.  Skin: Palms with hyperpigmentation. No skin breakdown.  Port-A-Cath site is without erythema.   Lab Results:  Lab Results  Component Value Date   WBC 4.8 08/01/2013   HGB 10.6* 08/01/2013   HCT 34.8* 08/01/2013   MCV 75.8* 08/01/2013   PLT 215 08/01/2013   NEUTROABS 2.5 08/01/2013    Imaging:  No results found.  Medications: I have reviewed the patient's current medications.  Assessment/Plan: 1.Metastatic gastric cancer-status post biopsy of a gastric mass on 07/20/2012 with the pathology confirming adenocarcinoma, HER-2 non-amplified.  - CT 07/18/2012 consistent with a primary gastric mass and metastatic omental/peritoneal nodules  -biopsy of a peritoneal nodule 07/31/2012 consistent with metastatic adenocarcinoma  -Cycle 1 of FOLFOX on 08/02/2012  -Restaging CT 10/23/2012 consistent with a partial response-decrease in the primary gastric tumor and omental/peritoneal implants  -cycle 10 of FOLFOX completed 01/11/2013  -normal CEA 01/11/2013  -Restaging CT 01/23/2013 with improvement in the peritoneal metastases and an increased size of an indeterminate splenic lesion  -Initiation  of maintenance Xeloda therapy on a 7 day on/7 day off schedule 01/29/2013.  -Xeloda dose reduced to 1000 mg twice daily 7 days on/7 days off beginning 02/26/2013 due to nausea and diarrhea.  -Xeloda dose reduced to 500 mg twice daily, 7 days/14 days off 03/20/2013.  -Xeloda discontinued 04/25/2013 due to progressive hand foot syndrome.  -CEA elevated at 9.1 on 04/25/2013.  -CEA stable at 9.5 on 05/09/2013.  -Initiation of infusional 5-FU as per the FOLFOX regimen 05/09/2013.  -CEA 7.4 on 05/24/2013. -CEA 7.9 on 06/21/2013. 3. Multiple colon polyps documented on a colonoscopy 06/22/2012-tubular adenomas, tubulovillous adenomas, and hyperplastic polyps  4. Family history of colon cancer (mother)  24. Pain secondary to the primary gastric mass or carcinomatosis -improved  6. 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 Enrique Sack, multiple extractions were recommended  7. Anorexia, nausea, and early satiety-improved  8. 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 Nausea following chemotherapy-improved with the addition of prophylactic Decadron  10. Skin rash-most likely secondary to Decadron, improved  11. History of neutropenia secondary to chemotherapy  12. 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  14. Nausea and diarrhea while treated with Xeloda. Improved.  15. Hand-foot syndrome 03/07/2013 manifesting with erythema, dry desquamation and pain on the palms and erythema and pain on the soles. Progressive hand pain 04/25/2013. Xeloda discontinued.     Disposition: He appears well. Plan to continue 5-FU on a 3 week schedule. We will followup on the CEA from today. He will return for chemotherapy in 3 weeks and an office  visit in 6 weeks.  Plan reviewed with Dr. Benay Spice.    Joseph Salinas  ANP/GNP-BC   08/01/2013  10:33 AM

## 2013-08-01 NOTE — Telephone Encounter (Signed)
Gave pt appt for lab and MD emailed Michelle regarding chemo  °

## 2013-08-03 ENCOUNTER — Ambulatory Visit (HOSPITAL_BASED_OUTPATIENT_CLINIC_OR_DEPARTMENT_OTHER): Payer: BC Managed Care – PPO

## 2013-08-03 VITALS — BP 133/86 | HR 73 | Temp 97.0°F

## 2013-08-03 DIAGNOSIS — Z452 Encounter for adjustment and management of vascular access device: Secondary | ICD-10-CM

## 2013-08-03 DIAGNOSIS — C169 Malignant neoplasm of stomach, unspecified: Secondary | ICD-10-CM

## 2013-08-03 MED ORDER — HEPARIN SOD (PORK) LOCK FLUSH 100 UNIT/ML IV SOLN
500.0000 [IU] | Freq: Once | INTRAVENOUS | Status: AC | PRN
  Administered 2013-08-03: 500 [IU]
  Filled 2013-08-03: qty 5

## 2013-08-03 MED ORDER — SODIUM CHLORIDE 0.9 % IJ SOLN
10.0000 mL | INTRAMUSCULAR | Status: DC | PRN
  Administered 2013-08-03: 10 mL
  Filled 2013-08-03: qty 10

## 2013-08-05 ENCOUNTER — Telehealth: Payer: Self-pay | Admitting: Oncology

## 2013-08-05 NOTE — Telephone Encounter (Signed)
Talked to pt's wife gave her all appt for June 2015 lab,md and chemo

## 2013-08-19 ENCOUNTER — Other Ambulatory Visit: Payer: Self-pay | Admitting: Oncology

## 2013-08-22 ENCOUNTER — Ambulatory Visit (HOSPITAL_BASED_OUTPATIENT_CLINIC_OR_DEPARTMENT_OTHER): Payer: BC Managed Care – PPO

## 2013-08-22 ENCOUNTER — Other Ambulatory Visit (HOSPITAL_BASED_OUTPATIENT_CLINIC_OR_DEPARTMENT_OTHER): Payer: BC Managed Care – PPO

## 2013-08-22 VITALS — BP 140/90 | HR 61 | Temp 98.0°F | Resp 20

## 2013-08-22 DIAGNOSIS — C786 Secondary malignant neoplasm of retroperitoneum and peritoneum: Secondary | ICD-10-CM

## 2013-08-22 DIAGNOSIS — Z5111 Encounter for antineoplastic chemotherapy: Secondary | ICD-10-CM

## 2013-08-22 DIAGNOSIS — C169 Malignant neoplasm of stomach, unspecified: Secondary | ICD-10-CM

## 2013-08-22 LAB — COMPREHENSIVE METABOLIC PANEL (CC13)
ALT: 9 U/L (ref 0–55)
AST: 12 U/L (ref 5–34)
Albumin: 3.6 g/dL (ref 3.5–5.0)
Alkaline Phosphatase: 70 U/L (ref 40–150)
Anion Gap: 14 mEq/L — ABNORMAL HIGH (ref 3–11)
BUN: 10.6 mg/dL (ref 7.0–26.0)
CO2: 21 mEq/L — ABNORMAL LOW (ref 22–29)
Calcium: 9 mg/dL (ref 8.4–10.4)
Chloride: 108 mEq/L (ref 98–109)
Creatinine: 1 mg/dL (ref 0.7–1.3)
Glucose: 108 mg/dl (ref 70–140)
Potassium: 4.1 mEq/L (ref 3.5–5.1)
Sodium: 142 mEq/L (ref 136–145)
Total Bilirubin: 0.61 mg/dL (ref 0.20–1.20)
Total Protein: 7 g/dL (ref 6.4–8.3)

## 2013-08-22 LAB — CBC WITH DIFFERENTIAL/PLATELET
BASO%: 2 % (ref 0.0–2.0)
Basophils Absolute: 0.1 10*3/uL (ref 0.0–0.1)
EOS%: 3.5 % (ref 0.0–7.0)
Eosinophils Absolute: 0.2 10*3/uL (ref 0.0–0.5)
HCT: 34.5 % — ABNORMAL LOW (ref 38.4–49.9)
HGB: 10.5 g/dL — ABNORMAL LOW (ref 13.0–17.1)
LYMPH#: 1.3 10*3/uL (ref 0.9–3.3)
LYMPH%: 28.2 % (ref 14.0–49.0)
MCH: 22.7 pg — ABNORMAL LOW (ref 27.2–33.4)
MCHC: 30.5 g/dL — AB (ref 32.0–36.0)
MCV: 74.5 fL — AB (ref 79.3–98.0)
MONO#: 0.5 10*3/uL (ref 0.1–0.9)
MONO%: 11.1 % (ref 0.0–14.0)
NEUT#: 2.5 10*3/uL (ref 1.5–6.5)
NEUT%: 55.2 % (ref 39.0–75.0)
Platelets: 229 10*3/uL (ref 140–400)
RBC: 4.62 10*6/uL (ref 4.20–5.82)
RDW: 21 % — ABNORMAL HIGH (ref 11.0–14.6)
WBC: 4.6 10*3/uL (ref 4.0–10.3)

## 2013-08-22 LAB — CEA: CEA: 9.3 ng/mL — ABNORMAL HIGH (ref 0.0–5.0)

## 2013-08-22 MED ORDER — SODIUM CHLORIDE 0.9 % IV SOLN
Freq: Once | INTRAVENOUS | Status: AC
  Administered 2013-08-22: 10:00:00 via INTRAVENOUS

## 2013-08-22 MED ORDER — PROCHLORPERAZINE MALEATE 10 MG PO TABS
ORAL_TABLET | ORAL | Status: AC
  Filled 2013-08-22: qty 1

## 2013-08-22 MED ORDER — PROCHLORPERAZINE MALEATE 10 MG PO TABS
10.0000 mg | ORAL_TABLET | Freq: Once | ORAL | Status: AC
  Administered 2013-08-22: 10 mg via ORAL

## 2013-08-22 MED ORDER — FLUOROURACIL CHEMO INJECTION 2.5 GM/50ML
300.0000 mg/m2 | Freq: Once | INTRAVENOUS | Status: AC
  Administered 2013-08-22: 550 mg via INTRAVENOUS
  Filled 2013-08-22: qty 11

## 2013-08-22 MED ORDER — SODIUM CHLORIDE 0.9 % IV SOLN
1920.0000 mg/m2 | INTRAVENOUS | Status: DC
  Administered 2013-08-22: 3450 mg via INTRAVENOUS
  Filled 2013-08-22: qty 69

## 2013-08-22 MED ORDER — LEUCOVORIN CALCIUM INJECTION 350 MG
300.0000 mg/m2 | Freq: Once | INTRAVENOUS | Status: AC
  Administered 2013-08-22: 540 mg via INTRAVENOUS
  Filled 2013-08-22: qty 27

## 2013-08-22 NOTE — [facility] Oncology Navigator Note (Signed)
Met with patient to assess for needs.  His son is with him today.  Patient reports feeling much better, having more energy, and eating well.  He and his son both emphasized how much better he has been doing.  They both deny need for any additional services at present time.  He acknowledged that he is not having any insurance or financial issues at present.  Will continue to follow.

## 2013-08-22 NOTE — Patient Instructions (Signed)
[facility] Discharge Instructions for Patients Receiving Chemotherapy  Today you received the following chemotherapy agents: Leucovorin, 5FU  To help prevent nausea and vomiting after your treatment, we encourage you to take your nausea medication as prescribed.    If you develop nausea and vomiting that is not controlled by your nausea medication, call the clinic.   BELOW ARE SYMPTOMS THAT SHOULD BE REPORTED IMMEDIATELY:  *FEVER GREATER THAN 100.5 F  *CHILLS WITH OR WITHOUT FEVER  NAUSEA AND VOMITING THAT IS NOT CONTROLLED WITH YOUR NAUSEA MEDICATION  *UNUSUAL SHORTNESS OF BREATH  *UNUSUAL BRUISING OR BLEEDING  TENDERNESS IN MOUTH AND THROAT WITH OR WITHOUT PRESENCE OF ULCERS  *URINARY PROBLEMS  *BOWEL PROBLEMS  UNUSUAL RASH Items with * indicate a potential emergency and should be followed up as soon as possible.  Feel free to call the clinic you have any questions or concerns. The clinic phone number is [phone].    

## 2013-08-23 ENCOUNTER — Other Ambulatory Visit: Payer: Self-pay | Admitting: *Deleted

## 2013-08-24 ENCOUNTER — Ambulatory Visit (HOSPITAL_BASED_OUTPATIENT_CLINIC_OR_DEPARTMENT_OTHER): Payer: BC Managed Care – PPO

## 2013-08-24 VITALS — BP 121/70 | HR 82

## 2013-08-24 DIAGNOSIS — C169 Malignant neoplasm of stomach, unspecified: Secondary | ICD-10-CM

## 2013-08-24 DIAGNOSIS — C786 Secondary malignant neoplasm of retroperitoneum and peritoneum: Secondary | ICD-10-CM

## 2013-08-24 MED ORDER — SODIUM CHLORIDE 0.9 % IJ SOLN
10.0000 mL | INTRAMUSCULAR | Status: DC | PRN
  Administered 2013-08-24: 10 mL
  Filled 2013-08-24: qty 10

## 2013-08-24 MED ORDER — HEPARIN SOD (PORK) LOCK FLUSH 100 UNIT/ML IV SOLN
500.0000 [IU] | Freq: Once | INTRAVENOUS | Status: AC | PRN
  Administered 2013-08-24: 500 [IU]
  Filled 2013-08-24: qty 5

## 2013-09-12 ENCOUNTER — Ambulatory Visit (HOSPITAL_BASED_OUTPATIENT_CLINIC_OR_DEPARTMENT_OTHER): Payer: BC Managed Care – PPO

## 2013-09-12 ENCOUNTER — Ambulatory Visit (HOSPITAL_BASED_OUTPATIENT_CLINIC_OR_DEPARTMENT_OTHER): Payer: BC Managed Care – PPO | Admitting: Oncology

## 2013-09-12 ENCOUNTER — Telehealth: Payer: Self-pay | Admitting: Oncology

## 2013-09-12 ENCOUNTER — Other Ambulatory Visit (HOSPITAL_BASED_OUTPATIENT_CLINIC_OR_DEPARTMENT_OTHER): Payer: BC Managed Care – PPO

## 2013-09-12 ENCOUNTER — Telehealth: Payer: Self-pay | Admitting: *Deleted

## 2013-09-12 VITALS — BP 122/79 | HR 68 | Temp 98.0°F | Resp 18 | Ht 63.0 in | Wt 165.6 lb

## 2013-09-12 DIAGNOSIS — Z5111 Encounter for antineoplastic chemotherapy: Secondary | ICD-10-CM

## 2013-09-12 DIAGNOSIS — C786 Secondary malignant neoplasm of retroperitoneum and peritoneum: Secondary | ICD-10-CM

## 2013-09-12 DIAGNOSIS — C169 Malignant neoplasm of stomach, unspecified: Secondary | ICD-10-CM

## 2013-09-12 DIAGNOSIS — L270 Generalized skin eruption due to drugs and medicaments taken internally: Secondary | ICD-10-CM

## 2013-09-12 DIAGNOSIS — R197 Diarrhea, unspecified: Secondary | ICD-10-CM

## 2013-09-12 DIAGNOSIS — R110 Nausea: Secondary | ICD-10-CM

## 2013-09-12 DIAGNOSIS — T451X5A Adverse effect of antineoplastic and immunosuppressive drugs, initial encounter: Secondary | ICD-10-CM

## 2013-09-12 DIAGNOSIS — G622 Polyneuropathy due to other toxic agents: Secondary | ICD-10-CM

## 2013-09-12 DIAGNOSIS — Z800 Family history of malignant neoplasm of digestive organs: Secondary | ICD-10-CM

## 2013-09-12 LAB — COMPREHENSIVE METABOLIC PANEL (CC13)
ALBUMIN: 3.6 g/dL (ref 3.5–5.0)
ALK PHOS: 74 U/L (ref 40–150)
ALT: 10 U/L (ref 0–55)
AST: 12 U/L (ref 5–34)
Anion Gap: 6 mEq/L (ref 3–11)
BILIRUBIN TOTAL: 0.5 mg/dL (ref 0.20–1.20)
BUN: 10.6 mg/dL (ref 7.0–26.0)
CO2: 27 mEq/L (ref 22–29)
Calcium: 8.7 mg/dL (ref 8.4–10.4)
Chloride: 108 mEq/L (ref 98–109)
Creatinine: 1 mg/dL (ref 0.7–1.3)
GLUCOSE: 103 mg/dL (ref 70–140)
POTASSIUM: 3.9 meq/L (ref 3.5–5.1)
SODIUM: 141 meq/L (ref 136–145)
Total Protein: 6.9 g/dL (ref 6.4–8.3)

## 2013-09-12 LAB — CBC WITH DIFFERENTIAL/PLATELET
BASO%: 2.2 % — AB (ref 0.0–2.0)
Basophils Absolute: 0.1 10*3/uL (ref 0.0–0.1)
EOS ABS: 0.1 10*3/uL (ref 0.0–0.5)
EOS%: 2.8 % (ref 0.0–7.0)
HCT: 33.8 % — ABNORMAL LOW (ref 38.4–49.9)
HGB: 10.4 g/dL — ABNORMAL LOW (ref 13.0–17.1)
LYMPH%: 24.8 % (ref 14.0–49.0)
MCH: 23.1 pg — AB (ref 27.2–33.4)
MCHC: 30.8 g/dL — ABNORMAL LOW (ref 32.0–36.0)
MCV: 75.1 fL — ABNORMAL LOW (ref 79.3–98.0)
MONO#: 0.6 10*3/uL (ref 0.1–0.9)
MONO%: 12 % (ref 0.0–14.0)
NEUT%: 58.2 % (ref 39.0–75.0)
NEUTROS ABS: 2.9 10*3/uL (ref 1.5–6.5)
Platelets: 238 10*3/uL (ref 140–400)
RBC: 4.5 10*6/uL (ref 4.20–5.82)
RDW: 20.5 % — AB (ref 11.0–14.6)
WBC: 5.1 10*3/uL (ref 4.0–10.3)
lymph#: 1.3 10*3/uL (ref 0.9–3.3)

## 2013-09-12 LAB — CEA: CEA: 8.9 ng/mL — AB (ref 0.0–5.0)

## 2013-09-12 MED ORDER — FLUOROURACIL CHEMO INJECTION 2.5 GM/50ML
300.0000 mg/m2 | Freq: Once | INTRAVENOUS | Status: AC
  Administered 2013-09-12: 550 mg via INTRAVENOUS
  Filled 2013-09-12: qty 11

## 2013-09-12 MED ORDER — SODIUM CHLORIDE 0.9 % IV SOLN
Freq: Once | INTRAVENOUS | Status: AC
  Administered 2013-09-12: 10:00:00 via INTRAVENOUS

## 2013-09-12 MED ORDER — SODIUM CHLORIDE 0.9 % IJ SOLN
10.0000 mL | INTRAMUSCULAR | Status: DC | PRN
  Filled 2013-09-12: qty 10

## 2013-09-12 MED ORDER — PROCHLORPERAZINE MALEATE 10 MG PO TABS
ORAL_TABLET | ORAL | Status: AC
  Filled 2013-09-12: qty 1

## 2013-09-12 MED ORDER — FLUOROURACIL CHEMO INJECTION 5 GM/100ML
1920.0000 mg/m2 | INTRAVENOUS | Status: DC
  Administered 2013-09-12: 3450 mg via INTRAVENOUS
  Filled 2013-09-12: qty 69

## 2013-09-12 MED ORDER — PROCHLORPERAZINE MALEATE 10 MG PO TABS
10.0000 mg | ORAL_TABLET | Freq: Once | ORAL | Status: AC
  Administered 2013-09-12: 10 mg via ORAL

## 2013-09-12 MED ORDER — LEUCOVORIN CALCIUM INJECTION 350 MG
300.0000 mg/m2 | Freq: Once | INTRAVENOUS | Status: AC
  Administered 2013-09-12: 540 mg via INTRAVENOUS
  Filled 2013-09-12: qty 27

## 2013-09-12 NOTE — Patient Instructions (Signed)
[facility] Discharge Instructions for Patients Receiving Chemotherapy  Today you received the following chemotherapy agents: leucovorin, 17fu, 21fu home infusion  To help prevent nausea and vomiting after your treatment, we encourage you to take your nausea medication.  Take it as often as prescribed.     If you develop nausea and vomiting that is not controlled by your nausea medication, call the clinic. If it is after clinic hours your family physician or the after hours number for the clinic or go to the Emergency Department.   BELOW ARE SYMPTOMS THAT SHOULD BE REPORTED IMMEDIATELY:  *FEVER GREATER THAN 100.5 F  *CHILLS WITH OR WITHOUT FEVER  NAUSEA AND VOMITING THAT IS NOT CONTROLLED WITH YOUR NAUSEA MEDICATION  *UNUSUAL SHORTNESS OF BREATH  *UNUSUAL BRUISING OR BLEEDING  TENDERNESS IN MOUTH AND THROAT WITH OR WITHOUT PRESENCE OF ULCERS  *URINARY PROBLEMS  *BOWEL PROBLEMS  UNUSUAL RASH Items with * indicate a potential emergency and should be followed up as soon as possible.  Feel free to call the clinic you have any questions or concerns. The clinic phone number is [phone].   I have been informed and understand all the instructions given to me. I know to contact the clinic, my physician, or go to the Emergency Department if any problems should occur. I do not have any questions at this time, but understand that I may call the clinic during office hours   should I have any questions or need assistance in obtaining follow up care.    __________________________________________  _____________  __________ Signature of Patient or Authorized Representative            Date                   Time    __________________________________________ Nurse's Signature

## 2013-09-12 NOTE — Telephone Encounter (Signed)
Per staff phone call and POF I have schedueld appts. Scheduler advised of appts.  JMW  

## 2013-09-12 NOTE — Progress Notes (Signed)
[facility] OFFICE PROGRESS NOTE   Diagnosis: Gastric cancer  INTERVAL HISTORY:   He returns as scheduled. He feels well. No nausea or abdominal pain. No diarrhea. He completed another cycle of 5-fluorouracil 08/22/2013. The peripheral numbness has improved. He continues to have "weakness "in the fingertips.  Objective:  Vital signs in last 24 hours:  Blood pressure 122/79, pulse 68, temperature 98 F (36.7 C), temperature source Oral, resp. rate 18, height 5' 3"  (1.6 m), weight 165 lb 9.6 oz (75.116 kg), SpO2 100.00%.    HEENT: No thrush or ulcers Resp: Lungs clear bilaterally Cardio: Regular rate and rhythm GI: No hepatomegaly, nontender, no mass, no apparent ascites Vascular: No leg edema Neuro: The vibratory sense is intact at the fingertips  Skin: Hyperpigmentation of the hands   Portacath/PICC-without erythema  Lab Results:  Lab Results  Component Value Date   WBC 5.1 09/12/2013   HGB 10.4* 09/12/2013   HCT 33.8* 09/12/2013   MCV 75.1* 09/12/2013   PLT 238 09/12/2013   NEUTROABS 2.9 09/12/2013     Lab Results  Component Value Date   CEA 9.3* 08/22/2013   Medications: I have reviewed the patient's current medications.  Assessment/Plan: 1.Metastatic gastric cancer-status post biopsy of a gastric mass on 07/20/2012 with the pathology confirming adenocarcinoma, HER-2 non-amplified.  - CT 07/18/2012 consistent with a primary gastric mass and metastatic omental/peritoneal nodules  -biopsy of a peritoneal nodule 07/31/2012 consistent with metastatic adenocarcinoma  -Cycle 1 of FOLFOX on 08/02/2012  -Restaging CT 10/23/2012 consistent with a partial response-decrease in the primary gastric tumor and omental/peritoneal implants  -cycle 10 of FOLFOX completed 01/11/2013  -normal CEA 01/11/2013  -Restaging CT 01/23/2013 with improvement in the peritoneal metastases and an increased size of an indeterminate splenic lesion  -Initiation of maintenance Xeloda  therapy on a 7 day on/7 day off schedule 01/29/2013.  -Xeloda dose reduced to 1000 mg twice daily 7 days on/7 days off beginning 02/26/2013 due to nausea and diarrhea.  -Xeloda dose reduced to 500 mg twice daily, 7 days/14 days off 03/20/2013.  -Xeloda discontinued 04/25/2013 due to progressive hand foot syndrome.  -CEA elevated at 9.1 on 04/25/2013.  -CEA stable at 9.5 on 05/09/2013.  -Initiation of infusional 5-FU as per the FOLFOX regimen 05/09/2013.  -CEA 7.4 on 05/24/2013.  -CEA 7.9 on 06/21/2013.  -CEA 9.3 on 08/22/2013 3. Multiple colon polyps documented on a colonoscopy 06/22/2012-tubular adenomas, tubulovillous adenomas, and hyperplastic polyps  4. Family history of colon cancer (mother)  31. Pain secondary to the primary gastric mass or carcinomatosis -resolved  6. 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 Enrique Sack, multiple extractions were recommended  7. Anorexia, nausea, and early satiety-improved  8. 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 Nausea following chemotherapy-improved with the addition of prophylactic Decadron  10. Skin rash-most likely secondary to Decadron, improved  11. History of neutropenia secondary to chemotherapy  12. 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 Nausea and diarrhea while treated with Xeloda. Improved.  15. Hand-foot syndrome 03/07/2013 manifesting with erythema, dry desquamation and pain on the palms and erythema and pain on the soles. Progressive hand pain 04/25/2013. Xeloda discontinued.    Disposition:  He appears stable. No clinical evidence for progression of the gastric cancer. The plan is to continue every 3 week 5 fluorouracil. He will be scheduled for a restaging CT  prior to an office visit 10/04/2013. Betsy Coder,  MD  09/12/2013  9:34 AM

## 2013-09-12 NOTE — Telephone Encounter (Signed)
Gave pt appt for lab,md ,CT and chemo for july 2015

## 2013-09-14 ENCOUNTER — Ambulatory Visit (HOSPITAL_BASED_OUTPATIENT_CLINIC_OR_DEPARTMENT_OTHER): Payer: BC Managed Care – PPO

## 2013-09-14 VITALS — BP 120/75

## 2013-09-14 DIAGNOSIS — C786 Secondary malignant neoplasm of retroperitoneum and peritoneum: Secondary | ICD-10-CM

## 2013-09-14 DIAGNOSIS — C169 Malignant neoplasm of stomach, unspecified: Secondary | ICD-10-CM

## 2013-09-14 MED ORDER — HEPARIN SOD (PORK) LOCK FLUSH 100 UNIT/ML IV SOLN
500.0000 [IU] | Freq: Once | INTRAVENOUS | Status: AC | PRN
  Administered 2013-09-14: 500 [IU]
  Filled 2013-09-14: qty 5

## 2013-09-14 MED ORDER — SODIUM CHLORIDE 0.9 % IJ SOLN
10.0000 mL | INTRAMUSCULAR | Status: DC | PRN
  Administered 2013-09-14: 10 mL
  Filled 2013-09-14: qty 10

## 2013-09-30 ENCOUNTER — Other Ambulatory Visit: Payer: Self-pay | Admitting: Oncology

## 2013-10-01 ENCOUNTER — Other Ambulatory Visit (HOSPITAL_BASED_OUTPATIENT_CLINIC_OR_DEPARTMENT_OTHER): Payer: BC Managed Care – PPO

## 2013-10-01 ENCOUNTER — Ambulatory Visit (HOSPITAL_COMMUNITY)
Admission: RE | Admit: 2013-10-01 | Discharge: 2013-10-01 | Disposition: A | Discharge status: Home / Self Care | Payer: BC Managed Care – PPO | Source: Ambulatory Visit | Attending: Oncology | Admitting: Oncology

## 2013-10-01 ENCOUNTER — Ambulatory Visit (HOSPITAL_COMMUNITY): Payer: Self-pay

## 2013-10-01 ENCOUNTER — Other Ambulatory Visit: Payer: Self-pay

## 2013-10-01 DIAGNOSIS — K7689 Other specified diseases of liver: Secondary | ICD-10-CM | POA: Insufficient documentation

## 2013-10-01 DIAGNOSIS — C169 Malignant neoplasm of stomach, unspecified: Secondary | ICD-10-CM

## 2013-10-01 LAB — CBC WITH DIFFERENTIAL/PLATELET
BASO%: 1.1 % (ref 0.0–2.0)
Basophils Absolute: 0.1 10*3/uL (ref 0.0–0.1)
EOS%: 3 % (ref 0.0–7.0)
Eosinophils Absolute: 0.2 10*3/uL (ref 0.0–0.5)
HCT: 34.7 % — ABNORMAL LOW (ref 38.4–49.9)
HGB: 10.5 g/dL — ABNORMAL LOW (ref 13.0–17.1)
LYMPH%: 31.3 % (ref 14.0–49.0)
MCH: 22.9 pg — AB (ref 27.2–33.4)
MCHC: 30.3 g/dL — AB (ref 32.0–36.0)
MCV: 75.8 fL — AB (ref 79.3–98.0)
MONO#: 0.8 10*3/uL (ref 0.1–0.9)
MONO%: 13.4 % (ref 0.0–14.0)
NEUT#: 2.9 10*3/uL (ref 1.5–6.5)
NEUT%: 51.2 % (ref 39.0–75.0)
PLATELETS: 243 10*3/uL (ref 140–400)
RBC: 4.58 10*6/uL (ref 4.20–5.82)
RDW: 19.8 % — ABNORMAL HIGH (ref 11.0–14.6)
WBC: 5.7 10*3/uL (ref 4.0–10.3)
lymph#: 1.8 10*3/uL (ref 0.9–3.3)

## 2013-10-01 MED ORDER — IOHEXOL 300 MG/ML  SOLN
100.0000 mL | Freq: Once | INTRAMUSCULAR | Status: AC | PRN
  Administered 2013-10-01: 100 mL via INTRAVENOUS

## 2013-10-02 LAB — CEA: CEA: 11.1 ng/mL — AB (ref 0.0–5.0)

## 2013-10-04 ENCOUNTER — Ambulatory Visit (HOSPITAL_BASED_OUTPATIENT_CLINIC_OR_DEPARTMENT_OTHER): Payer: BC Managed Care – PPO | Admitting: Nurse Practitioner

## 2013-10-04 ENCOUNTER — Other Ambulatory Visit: Payer: Self-pay

## 2013-10-04 ENCOUNTER — Telehealth: Payer: Self-pay | Admitting: Oncology

## 2013-10-04 ENCOUNTER — Ambulatory Visit: Payer: Self-pay

## 2013-10-04 VITALS — BP 124/76 | HR 73 | Temp 98.2°F | Resp 18 | Ht 63.0 in | Wt 168.0 lb

## 2013-10-04 DIAGNOSIS — R21 Rash and other nonspecific skin eruption: Secondary | ICD-10-CM

## 2013-10-04 DIAGNOSIS — R630 Anorexia: Secondary | ICD-10-CM

## 2013-10-04 DIAGNOSIS — C786 Secondary malignant neoplasm of retroperitoneum and peritoneum: Secondary | ICD-10-CM

## 2013-10-04 DIAGNOSIS — C169 Malignant neoplasm of stomach, unspecified: Secondary | ICD-10-CM

## 2013-10-04 DIAGNOSIS — G620 Drug-induced polyneuropathy: Secondary | ICD-10-CM

## 2013-10-04 DIAGNOSIS — R970 Elevated carcinoembryonic antigen [CEA]: Secondary | ICD-10-CM

## 2013-10-04 DIAGNOSIS — R110 Nausea: Secondary | ICD-10-CM

## 2013-10-04 DIAGNOSIS — K5900 Constipation, unspecified: Secondary | ICD-10-CM

## 2013-10-04 DIAGNOSIS — R6881 Early satiety: Secondary | ICD-10-CM

## 2013-10-04 NOTE — Progress Notes (Addendum)
[facility] OFFICE PROGRESS NOTE   Diagnosis:  Gastric cancer.  INTERVAL HISTORY:   Joseph Salinas returns as scheduled. He feels well. He has a good appetite. He is gaining weight. He has a good energy level. He denies nausea/vomiting. No mouth sores. No diarrhea. No hand or foot pain or redness. He notes intermittent numbness in the hands. He denies pain.  Objective:  Vital signs in last 24 hours:  Blood pressure 124/76, pulse 73, temperature 98.2 F (36.8 C), temperature source Oral, resp. rate 18, height 5' 3"  (1.6 m), weight 168 lb (76.204 kg).    HEENT: No thrush or ulcerations. Resp: Lungs clear. Cardio: Regular cardiac rhythm. GI: Abdomen soft and nontender. No hepatomegaly. No mass. Vascular: No leg edema. Skin: Palms without erythema.  Port-A-Cath site without erythema.    Lab Results:  Lab Results  Component Value Date   WBC 5.7 10/01/2013   HGB 10.5* 10/01/2013   HCT 34.7* 10/01/2013   MCV 75.8* 10/01/2013   PLT 243 10/01/2013   NEUTROABS 2.9 10/01/2013   10/01/2013 CEA 11.1. Imaging:  No results found.  Medications: I have reviewed the patient's current medications.  Assessment/Plan: 1.Metastatic gastric cancer-status post biopsy of a gastric mass on 07/20/2012 with the pathology confirming adenocarcinoma, HER-2 non-amplified.  - CT 07/18/2012 consistent with a primary gastric mass and metastatic omental/peritoneal nodules  -biopsy of a peritoneal nodule 07/31/2012 consistent with metastatic adenocarcinoma  -Cycle 1 of FOLFOX on 08/02/2012  -Restaging CT 10/23/2012 consistent with a partial response-decrease in the primary gastric tumor and omental/peritoneal implants  -cycle 10 of FOLFOX completed 01/11/2013  -normal CEA 01/11/2013  -Restaging CT 01/23/2013 with improvement in the peritoneal metastases and an increased size of an indeterminate splenic lesion  -Initiation of maintenance Xeloda therapy on a 7 day on/7 day off schedule  01/29/2013.  -Xeloda dose reduced to 1000 mg twice daily 7 days on/7 days off beginning 02/26/2013 due to nausea and diarrhea.  -Xeloda dose reduced to 500 mg twice daily, 7 days/14 days off 03/20/2013.  -Xeloda discontinued 04/25/2013 due to progressive hand foot syndrome.  -CEA elevated at 9.1 on 04/25/2013.  -CEA stable at 9.5 on 05/09/2013.  -Initiation of infusional 5-FU as per the FOLFOX regimen 05/09/2013.  -CEA 7.4 on 05/24/2013.  -CEA 7.9 on 06/21/2013.  -CEA 9.3 on 08/22/2013 . -CEA 11.1 on 10/01/2013. -Restaging CT 10/01/2013 with persistent asymmetric gastric wall thickening in the greater curvature of the mid stomach. Dominant index gastrocolic lymph node decreased in size. Other smaller perigastric lymph nodes stable. No new or progressive findings in or around the stomach. Low-density subcapsular splenic lesion resolved. 3. Multiple colon polyps documented on a colonoscopy 06/22/2012-tubular adenomas, tubulovillous adenomas, and hyperplastic polyps  4. Family history of colon cancer (mother)  81. Pain secondary to the primary gastric mass or carcinomatosis -resolved  6. 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 Enrique Sack, multiple extractions were recommended  7. Anorexia, nausea, and early satiety-improved  8. 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 Nausea following chemotherapy-improved with the addition of prophylactic Decadron  10. Skin rash-most likely secondary to Decadron, improved  11. History of neutropenia secondary to chemotherapy  12. 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 Nausea and diarrhea while treated with Xeloda. Improved.  15. Hand-foot syndrome 03/07/2013 manifesting with erythema, dry desquamation and pain on the palms and  erythema and pain on  the soles. Progressive hand pain 04/25/2013. Xeloda discontinued.     Disposition: Joseph Salinas appears stable. Restaging CT scan overall stable with no new findings. Dr. Benay Spice recommends continuation of 5-fluorouracil on a 3 week schedule.  Joseph Salinas would like to cancel today's treatment with plans to resume treatment on 10/10/2013.  We will see him in followup on 11/21/2013. He will contact the office in the interim with any problems.  Patient seen with Dr. Letta Kocher, Lattie Haw ANP/GNP-BC   10/04/2013  9:54 AM  This was a shared visit with Ned Card. The restaging CT reveals no evidence of disease progression. The CEA remains mildly elevated. His clinical status is stable. We recommend continuing 5-fluorouracil on a 3 week schedule.  Julieanne Manson, M.D.

## 2013-10-04 NOTE — Telephone Encounter (Signed)
gv pt appt schedule for july thru sept

## 2013-10-10 ENCOUNTER — Other Ambulatory Visit (HOSPITAL_BASED_OUTPATIENT_CLINIC_OR_DEPARTMENT_OTHER): Payer: BC Managed Care – PPO

## 2013-10-10 ENCOUNTER — Ambulatory Visit (HOSPITAL_BASED_OUTPATIENT_CLINIC_OR_DEPARTMENT_OTHER): Payer: BC Managed Care – PPO

## 2013-10-10 VITALS — BP 126/74 | HR 75 | Temp 97.6°F | Resp 18

## 2013-10-10 DIAGNOSIS — C169 Malignant neoplasm of stomach, unspecified: Secondary | ICD-10-CM

## 2013-10-10 DIAGNOSIS — C786 Secondary malignant neoplasm of retroperitoneum and peritoneum: Secondary | ICD-10-CM

## 2013-10-10 DIAGNOSIS — Z5111 Encounter for antineoplastic chemotherapy: Secondary | ICD-10-CM

## 2013-10-10 LAB — COMPREHENSIVE METABOLIC PANEL (CC13)
ALBUMIN: 3.5 g/dL (ref 3.5–5.0)
ALK PHOS: 73 U/L (ref 40–150)
ALT: 12 U/L (ref 0–55)
AST: 16 U/L (ref 5–34)
Anion Gap: 6 mEq/L (ref 3–11)
BUN: 10.1 mg/dL (ref 7.0–26.0)
CO2: 28 mEq/L (ref 22–29)
Calcium: 8.8 mg/dL (ref 8.4–10.4)
Chloride: 108 mEq/L (ref 98–109)
Creatinine: 1 mg/dL (ref 0.7–1.3)
Glucose: 99 mg/dl (ref 70–140)
POTASSIUM: 4 meq/L (ref 3.5–5.1)
SODIUM: 141 meq/L (ref 136–145)
Total Bilirubin: 0.37 mg/dL (ref 0.20–1.20)
Total Protein: 6.9 g/dL (ref 6.4–8.3)

## 2013-10-10 LAB — CBC WITH DIFFERENTIAL/PLATELET
BASO%: 2 % (ref 0.0–2.0)
BASOS ABS: 0.1 10*3/uL (ref 0.0–0.1)
EOS%: 2.9 % (ref 0.0–7.0)
Eosinophils Absolute: 0.2 10*3/uL (ref 0.0–0.5)
HCT: 33.7 % — ABNORMAL LOW (ref 38.4–49.9)
HGB: 10.3 g/dL — ABNORMAL LOW (ref 13.0–17.1)
LYMPH#: 1.8 10*3/uL (ref 0.9–3.3)
LYMPH%: 28.7 % (ref 14.0–49.0)
MCH: 22.7 pg — AB (ref 27.2–33.4)
MCHC: 30.5 g/dL — AB (ref 32.0–36.0)
MCV: 74.2 fL — AB (ref 79.3–98.0)
MONO#: 0.8 10*3/uL (ref 0.1–0.9)
MONO%: 12.9 % (ref 0.0–14.0)
NEUT#: 3.3 10*3/uL (ref 1.5–6.5)
NEUT%: 53.5 % (ref 39.0–75.0)
Platelets: 244 10*3/uL (ref 140–400)
RBC: 4.53 10*6/uL (ref 4.20–5.82)
RDW: 20.7 % — AB (ref 11.0–14.6)
WBC: 6.2 10*3/uL (ref 4.0–10.3)

## 2013-10-10 MED ORDER — LEUCOVORIN CALCIUM INJECTION 350 MG
300.0000 mg/m2 | Freq: Once | INTRAVENOUS | Status: AC
  Administered 2013-10-10: 540 mg via INTRAVENOUS
  Filled 2013-10-10: qty 27

## 2013-10-10 MED ORDER — FLUOROURACIL CHEMO INJECTION 5 GM/100ML
1920.0000 mg/m2 | INTRAVENOUS | Status: DC
  Administered 2013-10-10: 3450 mg via INTRAVENOUS
  Filled 2013-10-10: qty 69

## 2013-10-10 MED ORDER — SODIUM CHLORIDE 0.9 % IV SOLN
Freq: Once | INTRAVENOUS | Status: AC
  Administered 2013-10-10: 11:00:00 via INTRAVENOUS

## 2013-10-10 MED ORDER — FLUOROURACIL CHEMO INJECTION 2.5 GM/50ML
300.0000 mg/m2 | Freq: Once | INTRAVENOUS | Status: AC
Start: 2013-10-10 — End: 2013-10-10
  Administered 2013-10-10: 550 mg via INTRAVENOUS
  Filled 2013-10-10: qty 11

## 2013-10-10 MED ORDER — PROCHLORPERAZINE MALEATE 10 MG PO TABS
10.0000 mg | ORAL_TABLET | Freq: Once | ORAL | Status: AC
  Administered 2013-10-10: 10 mg via ORAL

## 2013-10-10 MED ORDER — PROCHLORPERAZINE MALEATE 10 MG PO TABS
ORAL_TABLET | ORAL | Status: AC
  Filled 2013-10-10: qty 1

## 2013-10-10 NOTE — Patient Instructions (Signed)
[facility] Discharge Instructions for Patients Receiving Chemotherapy  Today you received the following chemotherapy agents leucovorin, 75fu push, 38fu pump  To help prevent nausea and vomiting after your treatment, we encourage you to take your nausea medication if needed   If you develop nausea and vomiting that is not controlled by your nausea medication, call the clinic.   BELOW ARE SYMPTOMS THAT SHOULD BE REPORTED IMMEDIATELY:  *FEVER GREATER THAN 100.5 F  *CHILLS WITH OR WITHOUT FEVER  NAUSEA AND VOMITING THAT IS NOT CONTROLLED WITH YOUR NAUSEA MEDICATION  *UNUSUAL SHORTNESS OF BREATH  *UNUSUAL BRUISING OR BLEEDING  TENDERNESS IN MOUTH AND THROAT WITH OR WITHOUT PRESENCE OF ULCERS  *URINARY PROBLEMS  *BOWEL PROBLEMS  UNUSUAL RASH Items with * indicate a potential emergency and should be followed up as soon as possible.  Feel free to call the clinic you have any questions or concerns. The clinic phone number is [phone].

## 2013-10-12 ENCOUNTER — Ambulatory Visit (HOSPITAL_BASED_OUTPATIENT_CLINIC_OR_DEPARTMENT_OTHER): Payer: BC Managed Care – PPO

## 2013-10-12 VITALS — BP 121/74 | HR 84 | Temp 98.3°F

## 2013-10-12 DIAGNOSIS — C169 Malignant neoplasm of stomach, unspecified: Secondary | ICD-10-CM

## 2013-10-12 DIAGNOSIS — Z452 Encounter for adjustment and management of vascular access device: Secondary | ICD-10-CM

## 2013-10-12 MED ORDER — HEPARIN SOD (PORK) LOCK FLUSH 100 UNIT/ML IV SOLN
500.0000 [IU] | Freq: Once | INTRAVENOUS | Status: AC | PRN
  Administered 2013-10-12: 500 [IU]
  Filled 2013-10-12: qty 5

## 2013-10-12 MED ORDER — SODIUM CHLORIDE 0.9 % IJ SOLN
10.0000 mL | INTRAMUSCULAR | Status: DC | PRN
  Administered 2013-10-12: 10 mL
  Filled 2013-10-12: qty 10

## 2013-10-17 ENCOUNTER — Encounter: Payer: Self-pay | Admitting: Internal Medicine

## 2013-10-31 ENCOUNTER — Other Ambulatory Visit: Payer: Self-pay | Admitting: Hematology and Oncology

## 2013-10-31 ENCOUNTER — Ambulatory Visit (HOSPITAL_BASED_OUTPATIENT_CLINIC_OR_DEPARTMENT_OTHER): Payer: Self-pay

## 2013-10-31 ENCOUNTER — Other Ambulatory Visit (HOSPITAL_BASED_OUTPATIENT_CLINIC_OR_DEPARTMENT_OTHER): Payer: BC Managed Care – PPO

## 2013-10-31 VITALS — BP 131/75 | HR 65 | Temp 98.1°F | Resp 18

## 2013-10-31 DIAGNOSIS — C786 Secondary malignant neoplasm of retroperitoneum and peritoneum: Secondary | ICD-10-CM

## 2013-10-31 DIAGNOSIS — C169 Malignant neoplasm of stomach, unspecified: Secondary | ICD-10-CM

## 2013-10-31 DIAGNOSIS — Z5111 Encounter for antineoplastic chemotherapy: Secondary | ICD-10-CM

## 2013-10-31 LAB — COMPREHENSIVE METABOLIC PANEL (CC13)
ALT: 7 U/L (ref 0–55)
AST: 11 U/L (ref 5–34)
Albumin: 3.5 g/dL (ref 3.5–5.0)
Alkaline Phosphatase: 72 U/L (ref 40–150)
Anion Gap: 8 mEq/L (ref 3–11)
BUN: 9.6 mg/dL (ref 7.0–26.0)
CO2: 26 mEq/L (ref 22–29)
Calcium: 8.8 mg/dL (ref 8.4–10.4)
Chloride: 108 mEq/L (ref 98–109)
Creatinine: 0.9 mg/dL (ref 0.7–1.3)
GLUCOSE: 101 mg/dL (ref 70–140)
Potassium: 4 mEq/L (ref 3.5–5.1)
Sodium: 141 mEq/L (ref 136–145)
Total Bilirubin: 0.31 mg/dL (ref 0.20–1.20)
Total Protein: 6.8 g/dL (ref 6.4–8.3)

## 2013-10-31 LAB — CBC WITH DIFFERENTIAL/PLATELET
BASO%: 1.1 % (ref 0.0–2.0)
Basophils Absolute: 0.1 10*3/uL (ref 0.0–0.1)
EOS ABS: 0.1 10*3/uL (ref 0.0–0.5)
EOS%: 2.4 % (ref 0.0–7.0)
HCT: 32.9 % — ABNORMAL LOW (ref 38.4–49.9)
HGB: 10 g/dL — ABNORMAL LOW (ref 13.0–17.1)
LYMPH%: 27.7 % (ref 14.0–49.0)
MCH: 22.5 pg — AB (ref 27.2–33.4)
MCHC: 30.6 g/dL — ABNORMAL LOW (ref 32.0–36.0)
MCV: 73.7 fL — AB (ref 79.3–98.0)
MONO#: 1.1 10*3/uL — ABNORMAL HIGH (ref 0.1–0.9)
MONO%: 17.5 % — ABNORMAL HIGH (ref 0.0–14.0)
NEUT%: 51.3 % (ref 39.0–75.0)
NEUTROS ABS: 3.2 10*3/uL (ref 1.5–6.5)
Platelets: 227 10*3/uL (ref 140–400)
RBC: 4.46 10*6/uL (ref 4.20–5.82)
RDW: 20.6 % — ABNORMAL HIGH (ref 11.0–14.6)
WBC: 6.3 10*3/uL (ref 4.0–10.3)
lymph#: 1.8 10*3/uL (ref 0.9–3.3)

## 2013-10-31 MED ORDER — SODIUM CHLORIDE 0.9 % IV SOLN
1920.0000 mg/m2 | INTRAVENOUS | Status: DC
  Administered 2013-10-31: 3450 mg via INTRAVENOUS
  Filled 2013-10-31: qty 69

## 2013-10-31 MED ORDER — PROCHLORPERAZINE MALEATE 10 MG PO TABS
10.0000 mg | ORAL_TABLET | Freq: Once | ORAL | Status: AC
  Administered 2013-10-31: 10 mg via ORAL

## 2013-10-31 MED ORDER — LEUCOVORIN CALCIUM INJECTION 350 MG
300.0000 mg/m2 | Freq: Once | INTRAVENOUS | Status: AC
  Administered 2013-10-31: 540 mg via INTRAVENOUS
  Filled 2013-10-31: qty 27

## 2013-10-31 MED ORDER — FLUOROURACIL CHEMO INJECTION 2.5 GM/50ML
300.0000 mg/m2 | Freq: Once | INTRAVENOUS | Status: AC
  Administered 2013-10-31: 550 mg via INTRAVENOUS
  Filled 2013-10-31: qty 11

## 2013-10-31 MED ORDER — SODIUM CHLORIDE 0.9 % IV SOLN
Freq: Once | INTRAVENOUS | Status: AC
  Administered 2013-10-31: 11:00:00 via INTRAVENOUS

## 2013-10-31 MED ORDER — PROCHLORPERAZINE MALEATE 10 MG PO TABS
ORAL_TABLET | ORAL | Status: AC
  Filled 2013-10-31: qty 1

## 2013-10-31 NOTE — Patient Instructions (Signed)
[facility] Discharge Instructions for Patients Receiving Chemotherapy  Today you received the following chemotherapy agents: Leucovorin and 5FU   To help prevent nausea and vomiting after your treatment, we encourage you to take your nausea medication as prescribed.    If you develop nausea and vomiting that is not controlled by your nausea medication, call the clinic.   BELOW ARE SYMPTOMS THAT SHOULD BE REPORTED IMMEDIATELY:  *FEVER GREATER THAN 100.5 F  *CHILLS WITH OR WITHOUT FEVER  NAUSEA AND VOMITING THAT IS NOT CONTROLLED WITH YOUR NAUSEA MEDICATION  *UNUSUAL SHORTNESS OF BREATH  *UNUSUAL BRUISING OR BLEEDING  TENDERNESS IN MOUTH AND THROAT WITH OR WITHOUT PRESENCE OF ULCERS  *URINARY PROBLEMS  *BOWEL PROBLEMS  UNUSUAL RASH Items with * indicate a potential emergency and should be followed up as soon as possible.  Feel free to call the clinic you have any questions or concerns. The clinic phone number is [phone].    

## 2013-11-01 ENCOUNTER — Telehealth: Payer: Self-pay

## 2013-11-01 LAB — CEA: CEA: 8.6 ng/mL — AB (ref 0.0–5.0)

## 2013-11-01 NOTE — Telephone Encounter (Signed)
Called and inform patients wife of patients CEA results being at 8.6, Wallis verbalized understanding and denies any questions or concerns.

## 2013-11-02 ENCOUNTER — Ambulatory Visit (HOSPITAL_BASED_OUTPATIENT_CLINIC_OR_DEPARTMENT_OTHER): Payer: BC Managed Care – PPO

## 2013-11-02 ENCOUNTER — Encounter: Payer: Self-pay | Admitting: *Deleted

## 2013-11-02 VITALS — BP 132/77 | HR 83 | Temp 98.5°F

## 2013-11-02 DIAGNOSIS — C786 Secondary malignant neoplasm of retroperitoneum and peritoneum: Secondary | ICD-10-CM

## 2013-11-02 DIAGNOSIS — C169 Malignant neoplasm of stomach, unspecified: Secondary | ICD-10-CM

## 2013-11-02 MED ORDER — HEPARIN SOD (PORK) LOCK FLUSH 100 UNIT/ML IV SOLN
500.0000 [IU] | Freq: Once | INTRAVENOUS | Status: AC | PRN
  Administered 2013-11-02: 500 [IU]
  Filled 2013-11-02: qty 5

## 2013-11-02 MED ORDER — SODIUM CHLORIDE 0.9 % IJ SOLN
10.0000 mL | INTRAMUSCULAR | Status: DC | PRN
  Administered 2013-11-02: 10 mL
  Filled 2013-11-02: qty 10

## 2013-11-02 NOTE — Progress Notes (Signed)
[facility] Work  Clinical Social Work was referred by patient's wife for assessment of psychosocial needs due to changes in family dynamics.  Clinical Social Worker met with wife and provided emotional support and additional resources to assist family during this time of change.  Clinical Social Work interventions: Solution focused discussion Resource assistance  Loren Racer, LCSW Clinical Social Worker [facility] for [facility] Wednesday, Thursday and Friday Phone: [phone] Fax: [phone]

## 2013-11-21 ENCOUNTER — Other Ambulatory Visit (HOSPITAL_BASED_OUTPATIENT_CLINIC_OR_DEPARTMENT_OTHER): Payer: BC Managed Care – PPO

## 2013-11-21 ENCOUNTER — Telehealth: Payer: Self-pay | Admitting: *Deleted

## 2013-11-21 ENCOUNTER — Ambulatory Visit (HOSPITAL_BASED_OUTPATIENT_CLINIC_OR_DEPARTMENT_OTHER): Payer: BC Managed Care – PPO | Admitting: Oncology

## 2013-11-21 ENCOUNTER — Telehealth: Payer: Self-pay | Admitting: Oncology

## 2013-11-21 ENCOUNTER — Ambulatory Visit (HOSPITAL_BASED_OUTPATIENT_CLINIC_OR_DEPARTMENT_OTHER): Payer: BC Managed Care – PPO

## 2013-11-21 VITALS — BP 134/79 | HR 73 | Temp 98.1°F | Resp 18 | Ht 63.0 in | Wt 167.1 lb

## 2013-11-21 DIAGNOSIS — C786 Secondary malignant neoplasm of retroperitoneum and peritoneum: Secondary | ICD-10-CM

## 2013-11-21 DIAGNOSIS — C169 Malignant neoplasm of stomach, unspecified: Secondary | ICD-10-CM

## 2013-11-21 DIAGNOSIS — R21 Rash and other nonspecific skin eruption: Secondary | ICD-10-CM

## 2013-11-21 DIAGNOSIS — D509 Iron deficiency anemia, unspecified: Secondary | ICD-10-CM

## 2013-11-21 DIAGNOSIS — G620 Drug-induced polyneuropathy: Secondary | ICD-10-CM

## 2013-11-21 DIAGNOSIS — Z5111 Encounter for antineoplastic chemotherapy: Secondary | ICD-10-CM

## 2013-11-21 LAB — CBC WITH DIFFERENTIAL/PLATELET
BASO%: 1.5 % (ref 0.0–2.0)
Basophils Absolute: 0.1 10*3/uL (ref 0.0–0.1)
EOS ABS: 0.1 10*3/uL (ref 0.0–0.5)
EOS%: 2.8 % (ref 0.0–7.0)
HEMATOCRIT: 33.5 % — AB (ref 38.4–49.9)
HGB: 10.1 g/dL — ABNORMAL LOW (ref 13.0–17.1)
LYMPH%: 26.2 % (ref 14.0–49.0)
MCH: 22.3 pg — AB (ref 27.2–33.4)
MCHC: 30.3 g/dL — ABNORMAL LOW (ref 32.0–36.0)
MCV: 73.6 fL — AB (ref 79.3–98.0)
MONO#: 0.7 10*3/uL (ref 0.1–0.9)
MONO%: 13.4 % (ref 0.0–14.0)
NEUT%: 56.1 % (ref 39.0–75.0)
NEUTROS ABS: 2.9 10*3/uL (ref 1.5–6.5)
PLATELETS: 259 10*3/uL (ref 140–400)
RBC: 4.55 10*6/uL (ref 4.20–5.82)
RDW: 21.4 % — AB (ref 11.0–14.6)
WBC: 5.2 10*3/uL (ref 4.0–10.3)
lymph#: 1.4 10*3/uL (ref 0.9–3.3)

## 2013-11-21 LAB — COMPREHENSIVE METABOLIC PANEL (CC13)
ALBUMIN: 3.4 g/dL — AB (ref 3.5–5.0)
ALK PHOS: 79 U/L (ref 40–150)
ALT: 11 U/L (ref 0–55)
AST: 13 U/L (ref 5–34)
Anion Gap: 7 mEq/L (ref 3–11)
BILIRUBIN TOTAL: 0.41 mg/dL (ref 0.20–1.20)
BUN: 13.1 mg/dL (ref 7.0–26.0)
CO2: 26 mEq/L (ref 22–29)
Calcium: 8.7 mg/dL (ref 8.4–10.4)
Chloride: 108 mEq/L (ref 98–109)
Creatinine: 1 mg/dL (ref 0.7–1.3)
GLUCOSE: 107 mg/dL (ref 70–140)
POTASSIUM: 3.6 meq/L (ref 3.5–5.1)
SODIUM: 141 meq/L (ref 136–145)
Total Protein: 6.9 g/dL (ref 6.4–8.3)

## 2013-11-21 LAB — FERRITIN CHCC: FERRITIN: 6 ng/mL — AB (ref 22–316)

## 2013-11-21 MED ORDER — FLUOROURACIL CHEMO INJECTION 5 GM/100ML
1920.0000 mg/m2 | INTRAVENOUS | Status: DC
  Administered 2013-11-21: 3450 mg via INTRAVENOUS
  Filled 2013-11-21: qty 69

## 2013-11-21 MED ORDER — LEUCOVORIN CALCIUM INJECTION 350 MG
300.0000 mg/m2 | Freq: Once | INTRAVENOUS | Status: AC
  Administered 2013-11-21: 540 mg via INTRAVENOUS
  Filled 2013-11-21: qty 27

## 2013-11-21 MED ORDER — SODIUM CHLORIDE 0.9 % IV SOLN
Freq: Once | INTRAVENOUS | Status: AC
  Administered 2013-11-21: 10:00:00 via INTRAVENOUS

## 2013-11-21 MED ORDER — PROCHLORPERAZINE MALEATE 10 MG PO TABS
10.0000 mg | ORAL_TABLET | Freq: Once | ORAL | Status: AC
  Administered 2013-11-21: 10 mg via ORAL

## 2013-11-21 MED ORDER — PROCHLORPERAZINE MALEATE 10 MG PO TABS
ORAL_TABLET | ORAL | Status: AC
  Filled 2013-11-21: qty 1

## 2013-11-21 MED ORDER — FLUOROURACIL CHEMO INJECTION 2.5 GM/50ML
300.0000 mg/m2 | Freq: Once | INTRAVENOUS | Status: AC
  Administered 2013-11-21: 550 mg via INTRAVENOUS
  Filled 2013-11-21: qty 11

## 2013-11-21 NOTE — Patient Instructions (Signed)
[facility] Discharge Instructions for Patients Receiving Chemotherapy  Today you received the following chemotherapy agents: Leucovorin and 5FU   To help prevent nausea and vomiting after your treatment, we encourage you to take your nausea medication as prescribed.    If you develop nausea and vomiting that is not controlled by your nausea medication, call the clinic.   BELOW ARE SYMPTOMS THAT SHOULD BE REPORTED IMMEDIATELY:  *FEVER GREATER THAN 100.5 F  *CHILLS WITH OR WITHOUT FEVER  NAUSEA AND VOMITING THAT IS NOT CONTROLLED WITH YOUR NAUSEA MEDICATION  *UNUSUAL SHORTNESS OF BREATH  *UNUSUAL BRUISING OR BLEEDING  TENDERNESS IN MOUTH AND THROAT WITH OR WITHOUT PRESENCE OF ULCERS  *URINARY PROBLEMS  *BOWEL PROBLEMS  UNUSUAL RASH Items with * indicate a potential emergency and should be followed up as soon as possible.  Feel free to call the clinic you have any questions or concerns. The clinic phone number is [phone].    

## 2013-11-21 NOTE — Telephone Encounter (Signed)
Per staff message and POF I have scheduled appts. Advised scheduler of appts. JMW  

## 2013-11-21 NOTE — Progress Notes (Signed)
Pt/family declined flu shot in office today after explanation of purpose.  Pt/family states "we don't do that"

## 2013-11-21 NOTE — Telephone Encounter (Signed)
Pt confirmed labs/ov per 09/02 POF, sent msg to add chemo, gave pt AVS....KJ °

## 2013-11-21 NOTE — Progress Notes (Signed)
[facility] OFFICE PROGRESS NOTE   Diagnosis: Gastric cancer  INTERVAL HISTORY:   He returns as scheduled. He continues every 3 week 5 fluorouracil. He feels well. Good appetite. He continues to have numbness and "weakness "at the fingertips. No numbness in the feet. He has developed a "rash "over the face and arms. He has been working in the son.  Objective:  Vital signs in last 24 hours:  Blood pressure 134/79, pulse 73, temperature 98.1 F (36.7 C), temperature source Oral, resp. rate 18, height 5' 3"  (1.6 m), weight 167 lb 1.6 oz (75.796 kg), SpO2 100.00%.    HEENT: No ulcers.? Mild thrush at the right anterior buccal mucosa Resp: Lungs clear bilaterally Cardio: Regular rate and rhythm GI: No hepatomegaly, nontender Vascular: No leg edema  Skin: Hyperpigmentation with a raised rash over the forearms and face in a sun exposure distribution.   Portacath/PICC-without erythema  Lab Results:  Lab Results  Component Value Date   WBC 5.2 11/21/2013   HGB 10.1* 11/21/2013   HCT 33.5* 11/21/2013   MCV 73.6* 11/21/2013   PLT 259 11/21/2013   NEUTROABS 2.9 11/21/2013     Lab Results  Component Value Date   CEA 8.6* 10/31/2013     Medications: I have reviewed the patient's current medications.  Assessment/Plan: 1.Metastatic gastric cancer-status post biopsy of a gastric mass on 07/20/2012 with the pathology confirming adenocarcinoma, HER-2 non-amplified.  - CT 07/18/2012 consistent with a primary gastric mass and metastatic omental/peritoneal nodules  -biopsy of a peritoneal nodule 07/31/2012 consistent with metastatic adenocarcinoma  -Cycle 1 of FOLFOX on 08/02/2012  -Restaging CT 10/23/2012 consistent with a partial response-decrease in the primary gastric tumor and omental/peritoneal implants  -cycle 10 of FOLFOX completed 01/11/2013  -normal CEA 01/11/2013  -Restaging CT 01/23/2013 with improvement in the peritoneal metastases and an increased size of an  indeterminate splenic lesion  -Initiation of maintenance Xeloda therapy on a 7 day on/7 day off schedule 01/29/2013.  -Xeloda dose reduced to 1000 mg twice daily 7 days on/7 days off beginning 02/26/2013 due to nausea and diarrhea.  -Xeloda dose reduced to 500 mg twice daily, 7 days/14 days off 03/20/2013.  -Xeloda discontinued 04/25/2013 due to progressive hand foot syndrome.  -CEA elevated at 9.1 on 04/25/2013.  -CEA stable at 9.5 on 05/09/2013.  -Initiation of infusional 5-FU as per the FOLFOX regimen 05/09/2013.  -CEA 7.4 on 05/24/2013.  -CEA 7.9 on 06/21/2013.  -CEA 9.3 on 08/22/2013 .  -CEA 11.1 on 10/01/2013.  -Restaging CT 10/01/2013 with persistent asymmetric gastric wall thickening in the greater curvature of the mid stomach. Dominant index gastrocolic lymph node decreased in size. Other smaller perigastric lymph nodes stable. No new or progressive findings in or around the stomach. Low-density subcapsular splenic lesion resolved.  3. Multiple colon polyps documented on a colonoscopy 06/22/2012-tubular adenomas, tubulovillous adenomas, and hyperplastic polyps  4. Family history of colon cancer (mother)  60. Pain secondary to the primary gastric mass or carcinomatosis -resolved  6. 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 Enrique Sack, multiple extractions were recommended  7. Anorexia, nausea, and early satiety-improved  8. 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 Nausea following chemotherapy-improved with the addition of prophylactic Decadron  10. Skin rash-most likely secondary to Decadron, improved  11. History of neutropenia secondary to chemotherapy  12. 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  Improved  14. Nausea and diarrhea while treated with Xeloda. Improved.  15. Hand-foot  syndrome 03/07/2013 manifesting with erythema, dry desquamation and pain on the palms and erythema and pain on the soles. Progressive hand pain 04/25/2013. Xeloda discontinued.  16. Anemia-microcytic, chronic-stable. We will check a ferritin level.  Disposition:  His overall status appears unchanged. The hyperpigmentation and rash over the face and arms is likely related to sun exposure complicated by 5-fluorouracil. I recommended he use sunscreen and avoid heavy sun exposure. The plan is to continue every 3 week 5 fluorouracil. We will check a ferritin level and recommend iron as indicated. He will return for an office visit in 6 weeks. We will check a CEA when he returns in 3 weeks.  Betsy Coder, MD  11/21/2013  9:46 AM

## 2013-11-22 ENCOUNTER — Telehealth: Payer: Self-pay | Admitting: *Deleted

## 2013-11-22 MED ORDER — DOCUSATE SODIUM 50 MG PO CAPS: 50.0000 mg | ORAL_CAPSULE | Freq: Two times a day (BID) | ORAL | Status: AC

## 2013-11-22 MED ORDER — PROCHLORPERAZINE MALEATE 10 MG PO TABS: 10.0000 mg | ORAL_TABLET | Freq: Four times a day (QID) | ORAL | Status: DC | PRN

## 2013-11-22 MED ORDER — FERROUS SULFATE 325 (65 FE) MG PO TBEC: 325.0000 mg | DELAYED_RELEASE_TABLET | Freq: Three times a day (TID) | ORAL | Status: DC

## 2013-11-22 NOTE — Telephone Encounter (Signed)
Pt's wife returned call, instructed her to have him begin Ferrous Sulfate 325 mg BID. Use stool softener for constipation. She requested Rx to be sent to pharmacy. Same done. She reports he woke up with N/V this morning. Needs refill on Compazine. Refill sent.

## 2013-11-22 NOTE — Telephone Encounter (Signed)
Message copied by Brien Few on Thu Nov 22, 2013  8:32 AM ------      Message from: Betsy Coder B      Created: Wed Nov 21, 2013  1:57 PM       Please call patient, iron is low, start ferrous sulfate 325mg  bid, use stool softener for constipation ------

## 2013-11-23 ENCOUNTER — Ambulatory Visit (HOSPITAL_BASED_OUTPATIENT_CLINIC_OR_DEPARTMENT_OTHER): Payer: BC Managed Care – PPO

## 2013-11-23 VITALS — BP 127/69 | HR 84 | Temp 98.0°F

## 2013-11-23 DIAGNOSIS — C786 Secondary malignant neoplasm of retroperitoneum and peritoneum: Secondary | ICD-10-CM

## 2013-11-23 DIAGNOSIS — C169 Malignant neoplasm of stomach, unspecified: Secondary | ICD-10-CM

## 2013-11-23 MED ORDER — SODIUM CHLORIDE 0.9 % IJ SOLN
10.0000 mL | INTRAMUSCULAR | Status: DC | PRN
  Administered 2013-11-23: 10 mL
  Filled 2013-11-23: qty 10

## 2013-11-23 MED ORDER — HEPARIN SOD (PORK) LOCK FLUSH 100 UNIT/ML IV SOLN
500.0000 [IU] | Freq: Once | INTRAVENOUS | Status: AC | PRN
  Administered 2013-11-23: 500 [IU]
  Filled 2013-11-23: qty 5

## 2013-12-09 ENCOUNTER — Other Ambulatory Visit: Payer: Self-pay | Admitting: Oncology

## 2013-12-12 ENCOUNTER — Ambulatory Visit (HOSPITAL_BASED_OUTPATIENT_CLINIC_OR_DEPARTMENT_OTHER): Payer: BC Managed Care – PPO

## 2013-12-12 ENCOUNTER — Other Ambulatory Visit (HOSPITAL_BASED_OUTPATIENT_CLINIC_OR_DEPARTMENT_OTHER): Payer: BC Managed Care – PPO

## 2013-12-12 VITALS — BP 123/79 | HR 68 | Temp 98.3°F | Resp 18

## 2013-12-12 DIAGNOSIS — C786 Secondary malignant neoplasm of retroperitoneum and peritoneum: Secondary | ICD-10-CM

## 2013-12-12 DIAGNOSIS — Z5111 Encounter for antineoplastic chemotherapy: Secondary | ICD-10-CM

## 2013-12-12 DIAGNOSIS — D509 Iron deficiency anemia, unspecified: Secondary | ICD-10-CM

## 2013-12-12 DIAGNOSIS — C169 Malignant neoplasm of stomach, unspecified: Secondary | ICD-10-CM

## 2013-12-12 LAB — CBC WITH DIFFERENTIAL/PLATELET
BASO%: 1.3 % (ref 0.0–2.0)
BASOS ABS: 0.1 10*3/uL (ref 0.0–0.1)
EOS%: 3.2 % (ref 0.0–7.0)
Eosinophils Absolute: 0.2 10*3/uL (ref 0.0–0.5)
HCT: 33.2 % — ABNORMAL LOW (ref 38.4–49.9)
HEMOGLOBIN: 10.2 g/dL — AB (ref 13.0–17.1)
LYMPH#: 1.6 10*3/uL (ref 0.9–3.3)
LYMPH%: 28.4 % (ref 14.0–49.0)
MCH: 22.6 pg — ABNORMAL LOW (ref 27.2–33.4)
MCHC: 30.6 g/dL — AB (ref 32.0–36.0)
MCV: 73.6 fL — ABNORMAL LOW (ref 79.3–98.0)
MONO#: 0.9 10*3/uL (ref 0.1–0.9)
MONO%: 15.5 % — AB (ref 0.0–14.0)
NEUT#: 2.9 10*3/uL (ref 1.5–6.5)
NEUT%: 51.6 % (ref 39.0–75.0)
Platelets: 285 10*3/uL (ref 140–400)
RBC: 4.5 10*6/uL (ref 4.20–5.82)
RDW: 20.5 % — AB (ref 11.0–14.6)
WBC: 5.6 10*3/uL (ref 4.0–10.3)

## 2013-12-12 LAB — CEA: CEA: 8.4 ng/mL — ABNORMAL HIGH (ref 0.0–5.0)

## 2013-12-12 LAB — FERRITIN CHCC: FERRITIN: 5 ng/mL — AB (ref 22–316)

## 2013-12-12 MED ORDER — SODIUM CHLORIDE 0.9 % IV SOLN
Freq: Once | INTRAVENOUS | Status: AC
  Administered 2013-12-12: 10:00:00 via INTRAVENOUS

## 2013-12-12 MED ORDER — LEUCOVORIN CALCIUM INJECTION 350 MG
300.0000 mg/m2 | Freq: Once | INTRAVENOUS | Status: AC
  Administered 2013-12-12: 540 mg via INTRAVENOUS
  Filled 2013-12-12: qty 27

## 2013-12-12 MED ORDER — SODIUM CHLORIDE 0.9 % IV SOLN
1920.0000 mg/m2 | INTRAVENOUS | Status: DC
  Administered 2013-12-12: 3450 mg via INTRAVENOUS
  Filled 2013-12-12: qty 69

## 2013-12-12 MED ORDER — PROCHLORPERAZINE MALEATE 10 MG PO TABS
ORAL_TABLET | ORAL | Status: AC
  Filled 2013-12-12: qty 1

## 2013-12-12 MED ORDER — FLUOROURACIL CHEMO INJECTION 2.5 GM/50ML
300.0000 mg/m2 | Freq: Once | INTRAVENOUS | Status: AC
  Administered 2013-12-12: 550 mg via INTRAVENOUS
  Filled 2013-12-12: qty 11

## 2013-12-12 MED ORDER — PROCHLORPERAZINE MALEATE 10 MG PO TABS
10.0000 mg | ORAL_TABLET | Freq: Once | ORAL | Status: AC
  Administered 2013-12-12: 10 mg via ORAL

## 2013-12-12 NOTE — Progress Notes (Signed)
Per Dr. Benay Spice, no CMET needed today for chemo.

## 2013-12-12 NOTE — Patient Instructions (Signed)
[facility] Discharge Instructions for Patients Receiving Chemotherapy  Today you received the following chemotherapy agents: Leucovorin, 5FU  To help prevent nausea and vomiting after your treatment, we encourage you to take your nausea medication as prescribed by your physician.   If you develop nausea and vomiting that is not controlled by your nausea medication, call the clinic.   BELOW ARE SYMPTOMS THAT SHOULD BE REPORTED IMMEDIATELY:  *FEVER GREATER THAN 100.5 F  *CHILLS WITH OR WITHOUT FEVER  NAUSEA AND VOMITING THAT IS NOT CONTROLLED WITH YOUR NAUSEA MEDICATION  *UNUSUAL SHORTNESS OF BREATH  *UNUSUAL BRUISING OR BLEEDING  TENDERNESS IN MOUTH AND THROAT WITH OR WITHOUT PRESENCE OF ULCERS  *URINARY PROBLEMS  *BOWEL PROBLEMS  UNUSUAL RASH Items with * indicate a potential emergency and should be followed up as soon as possible.  Feel free to call the clinic you have any questions or concerns. The clinic phone number is [phone].    

## 2013-12-14 ENCOUNTER — Ambulatory Visit (HOSPITAL_BASED_OUTPATIENT_CLINIC_OR_DEPARTMENT_OTHER): Payer: BC Managed Care – PPO

## 2013-12-14 VITALS — BP 129/75 | HR 87 | Temp 98.1°F

## 2013-12-14 DIAGNOSIS — C786 Secondary malignant neoplasm of retroperitoneum and peritoneum: Secondary | ICD-10-CM

## 2013-12-14 DIAGNOSIS — C169 Malignant neoplasm of stomach, unspecified: Secondary | ICD-10-CM

## 2013-12-14 MED ORDER — HEPARIN SOD (PORK) LOCK FLUSH 100 UNIT/ML IV SOLN
500.0000 [IU] | Freq: Once | INTRAVENOUS | Status: AC | PRN
  Administered 2013-12-14: 500 [IU]
  Filled 2013-12-14: qty 5

## 2013-12-14 MED ORDER — SODIUM CHLORIDE 0.9 % IJ SOLN
10.0000 mL | INTRAMUSCULAR | Status: DC | PRN
  Administered 2013-12-14: 10 mL
  Filled 2013-12-14: qty 10

## 2013-12-14 NOTE — Patient Instructions (Signed)
Fluorouracil, 5FU; Diclofenac topical cream What is this medicine? FLUOROURACIL; DICLOFENAC (flure oh YOOR a sil; dye KLOE fen ak) is a combination of a topical chemotherapy agent and non-steroidal anti-inflammatory drug (NSAID). It is used on the skin to treat skin cancer and skin conditions that could become cancer. This medicine may be used for other purposes; ask your health care provider or pharmacist if you have questions. COMMON BRAND NAME(S): FLUORAC What should I tell my health care provider before I take this medicine? They need to know if you have any of these conditions: -bleeding problems -cigarette smoker -DPD enzyme deficiency -heart disease -high blood pressure -if you frequently drink alcohol containing drinks -kidney disease -liver disease -open or infected skin -stomach problems -swelling or open sores at the treatment site -recent or planned coronary artery bypass graft (CABG) surgery -an unusual or allergic reaction to fluorouracil, diclofenac, aspirin, other NSAIDs, other medicines, foods, dyes, or preservatives -pregnant or trying to get pregnant -breast-feeding How should I use this medicine? This medicine is only for use on the skin. Follow the directions on the prescription label. Wash hands before and after use. Wash affected area and gently pat dry. To apply this medicine use a cotton-tipped applicator, or use gloves if applying with fingertips. If applied with unprotected fingertips, it is very important to wash your hands well after you apply this medicine. Avoid applying to the eyes, nose, or mouth. Apply enough medicine to cover the affected area. You can cover the area with a light gauze dressing, but do not use tight or air-tight dressings. Finish the full course prescribed by your doctor or health care professional, even if you think your condition is better. Do not stop taking except on the advice of your doctor or health care professional. Talk to your  pediatrician regarding the use of this medicine in children. Special care may be needed. Overdosage: If you think you've taken too much of this medicine contact a poison control center or emergency room at once. Overdosage: If you think you have taken too much of this medicine contact a poison control center or emergency room at once. NOTE: This medicine is only for you. Do not share this medicine with others. What if I miss a dose? If you miss a dose, apply it as soon as you can. If it is almost time for your next dose, only use that dose. Do not apply extra doses. Contact your doctor or health care professional if you miss more than one dose. What may interact with this medicine? Interactions are not expected. Do not use any other skin products without telling your doctor or health care professional. This list may not describe all possible interactions. Give your health care provider a list of all the medicines, herbs, non-prescription drugs, or dietary supplements you use. Also tell them if you smoke, drink alcohol, or use illegal drugs. Some items may interact with your medicine. What should I watch for while using this medicine? Visit your doctor or health care professional for checks on your progress. You will need to use this medicine for 2 to 6 weeks. This may be longer depending on the condition being treated. You may not see full healing for another 1 to 2 months after you stop using the medicine. Treated areas of skin can look unsightly during and for several weeks after treatment with this medicine. This medicine can make you more sensitive to the sun. Keep out of the sun. If you cannot avoid being in   the sun, wear protective clothing and use sunscreen. Do not use sun lamps or tanning beds/booths. Where should I keep my What side effects may I notice from receiving this medicine? Side effects that you should report to your doctor or health care professional as soon as possible: -allergic  reactions like skin rash, itching or hives, swelling of the face, lips, or tongue -black or bloody stools, blood in the urine or vomit -blurred vision -chest pain -difficulty breathing or wheezing -redness, blistering, peeling or loosening of the skin, including inside the mouth -severe redness and swelling of normal skin -slurred speech or weakness on one side of the body -trouble passing urine or change in the amount of urine -unexplained weight gain or swelling -unusually weak or tired -yellowing of eyes or skin Side effects that usually do not require medical attention (Report these to your doctor or health care professional if they continue or are bothersome.): -increased sensitivity of the skin to sun and ultraviolet light -pain and burning of the affected area -scaling or swelling of the affected area -skin rash, itching of the affected area -tenderness This list may not describe all possible side effects. Call your doctor for medical advice about side effects. You may report side effects to FDA at 1-800-FDA-1088. Where should I keep my medicine? Keep out of the reach of children. Store at room temperature between 20 and 25 degrees C (68 and 77 degrees F). Throw away any unused medicine after the expiration date. NOTE: This sheet is a summary. It may not cover all possible information. If you have questions about this medicine, talk to your doctor, pharmacist, or health care provider.  2015, [employer]vier/Gold Standard. (2013-07-09 11:09:58)  

## 2014-01-02 ENCOUNTER — Other Ambulatory Visit (HOSPITAL_BASED_OUTPATIENT_CLINIC_OR_DEPARTMENT_OTHER): Payer: BC Managed Care – PPO

## 2014-01-02 ENCOUNTER — Telehealth: Payer: Self-pay | Admitting: Nurse Practitioner

## 2014-01-02 ENCOUNTER — Ambulatory Visit (HOSPITAL_BASED_OUTPATIENT_CLINIC_OR_DEPARTMENT_OTHER): Payer: BC Managed Care – PPO | Admitting: Nurse Practitioner

## 2014-01-02 ENCOUNTER — Other Ambulatory Visit: Payer: Self-pay | Admitting: Oncology

## 2014-01-02 ENCOUNTER — Ambulatory Visit (HOSPITAL_BASED_OUTPATIENT_CLINIC_OR_DEPARTMENT_OTHER): Payer: BC Managed Care – PPO

## 2014-01-02 VITALS — BP 133/79 | HR 67 | Temp 98.1°F | Resp 18 | Ht 63.0 in | Wt 165.7 lb

## 2014-01-02 DIAGNOSIS — C786 Secondary malignant neoplasm of retroperitoneum and peritoneum: Secondary | ICD-10-CM

## 2014-01-02 DIAGNOSIS — Z5111 Encounter for antineoplastic chemotherapy: Secondary | ICD-10-CM

## 2014-01-02 DIAGNOSIS — C169 Malignant neoplasm of stomach, unspecified: Secondary | ICD-10-CM

## 2014-01-02 DIAGNOSIS — D509 Iron deficiency anemia, unspecified: Secondary | ICD-10-CM

## 2014-01-02 LAB — CBC WITH DIFFERENTIAL/PLATELET
BASO%: 1.2 % (ref 0.0–2.0)
Basophils Absolute: 0.1 10*3/uL (ref 0.0–0.1)
EOS%: 2.8 % (ref 0.0–7.0)
Eosinophils Absolute: 0.2 10*3/uL (ref 0.0–0.5)
HEMATOCRIT: 32.9 % — AB (ref 38.4–49.9)
HGB: 10 g/dL — ABNORMAL LOW (ref 13.0–17.1)
LYMPH%: 25.4 % (ref 14.0–49.0)
MCH: 22.1 pg — AB (ref 27.2–33.4)
MCHC: 30.3 g/dL — AB (ref 32.0–36.0)
MCV: 73.1 fL — AB (ref 79.3–98.0)
MONO#: 1 10*3/uL — ABNORMAL HIGH (ref 0.1–0.9)
MONO%: 18.6 % — AB (ref 0.0–14.0)
NEUT#: 2.9 10*3/uL (ref 1.5–6.5)
NEUT%: 52 % (ref 39.0–75.0)
Platelets: 280 10*3/uL (ref 140–400)
RBC: 4.5 10*6/uL (ref 4.20–5.82)
RDW: 20.7 % — ABNORMAL HIGH (ref 11.0–14.6)
WBC: 5.6 10*3/uL (ref 4.0–10.3)
lymph#: 1.4 10*3/uL (ref 0.9–3.3)

## 2014-01-02 MED ORDER — LEUCOVORIN CALCIUM INJECTION 350 MG
300.0000 mg/m2 | Freq: Once | INTRAMUSCULAR | Status: AC
  Administered 2014-01-02: 540 mg via INTRAVENOUS
  Filled 2014-01-02: qty 27

## 2014-01-02 MED ORDER — SODIUM CHLORIDE 0.9 % IV SOLN
1920.0000 mg/m2 | INTRAVENOUS | Status: DC
  Administered 2014-01-02: 3450 mg via INTRAVENOUS
  Filled 2014-01-02: qty 69

## 2014-01-02 MED ORDER — SODIUM CHLORIDE 0.9 % IV SOLN
Freq: Once | INTRAVENOUS | Status: AC
  Administered 2014-01-02: 11:00:00 via INTRAVENOUS

## 2014-01-02 MED ORDER — PROCHLORPERAZINE MALEATE 10 MG PO TABS
ORAL_TABLET | ORAL | Status: AC
  Filled 2014-01-02: qty 1

## 2014-01-02 MED ORDER — PROCHLORPERAZINE MALEATE 10 MG PO TABS
10.0000 mg | ORAL_TABLET | Freq: Once | ORAL | Status: AC
  Administered 2014-01-02: 10 mg via ORAL

## 2014-01-02 MED ORDER — FLUOROURACIL CHEMO INJECTION 2.5 GM/50ML
300.0000 mg/m2 | Freq: Once | INTRAVENOUS | Status: AC
  Administered 2014-01-02: 550 mg via INTRAVENOUS
  Filled 2014-01-02: qty 11

## 2014-01-02 NOTE — Patient Instructions (Signed)
[facility] Discharge Instructions for Patients Receiving Chemotherapy  Today you received the following chemotherapy agents :  Leucovorin,  Fluorouracil.  To help prevent nausea and vomiting after your treatment, we encourage you to take your nausea medication as prescribed by your physician.   If you develop nausea and vomiting that is not controlled by your nausea medication, call the clinic.   BELOW ARE SYMPTOMS THAT SHOULD BE REPORTED IMMEDIATELY:  *FEVER GREATER THAN 100.5 F  *CHILLS WITH OR WITHOUT FEVER  NAUSEA AND VOMITING THAT IS NOT CONTROLLED WITH YOUR NAUSEA MEDICATION  *UNUSUAL SHORTNESS OF BREATH  *UNUSUAL BRUISING OR BLEEDING  TENDERNESS IN MOUTH AND THROAT WITH OR WITHOUT PRESENCE OF ULCERS  *URINARY PROBLEMS  *BOWEL PROBLEMS  UNUSUAL RASH Items with * indicate a potential emergency and should be followed up as soon as possible.  Feel free to call the clinic you have any questions or concerns. The clinic phone number is [phone].

## 2014-01-02 NOTE — Telephone Encounter (Signed)
Gave avs & apt for 11/12 & 11/14 & 12/09 & 12/11. [name]

## 2014-01-02 NOTE — Progress Notes (Signed)
[facility] OFFICE PROGRESS NOTE   Diagnosis:  Gastric cancer   INTERVAL HISTORY:   Joseph Salinas returns as scheduled. He continues every 3 week 5-fluorouracil. He feels well. The skin rash overall is better. He notes persistence of the rash over the chest and abdomen. He denies nausea/vomiting. No dysphagia. No abdominal pain. He denies any mouth sores. No diarrhea. No hand or foot pain or redness. He has persistent numbness in the fingertips. He continues to have a good appetite.  Objective:  Vital signs in last 24 hours:  Blood pressure 133/79, pulse 67, temperature 98.1 F (36.7 C), temperature source Oral, resp. rate 18, height 5' 3" (1.6 m), weight 165 lb 11.2 oz (75.161 kg), SpO2 100.00%.    HEENT: No thrush or ulcers. Resp: Lungs clear bilaterally. Cardio: Regular rate and rhythm. GI: Abdomen soft and nontender. No hepatomegaly. No mass. Vascular: No leg edema. Skin: Palms without erythema. Acne-like rash scattered over the chest and abdomen.  Port-A-Cath without erythema.  Lab Results:  Lab Results  Component Value Date   WBC 5.6 01/02/2014   HGB 10.0* 01/02/2014   HCT 32.9* 01/02/2014   MCV 73.1* 01/02/2014   PLT 280 01/02/2014   NEUTROABS 2.9 01/02/2014    Imaging:  No results found.  Medications: I have reviewed the patient's current medications.  Assessment/Plan: 1.Metastatic gastric cancer-status post biopsy of a gastric mass on 07/20/2012 with the pathology confirming adenocarcinoma, HER-2 non-amplified.  - CT 07/18/2012 consistent with a primary gastric mass and metastatic omental/peritoneal nodules  -biopsy of a peritoneal nodule 07/31/2012 consistent with metastatic adenocarcinoma  -Cycle 1 of FOLFOX on 08/02/2012  -Restaging CT 10/23/2012 consistent with a partial response-decrease in the primary gastric tumor and omental/peritoneal implants  -cycle 10 of FOLFOX completed 01/11/2013  -normal CEA 01/11/2013  -Restaging CT 01/23/2013  with improvement in the peritoneal metastases and an increased size of an indeterminate splenic lesion  -Initiation of maintenance Xeloda therapy on a 7 day on/7 day off schedule 01/29/2013.  -Xeloda dose reduced to 1000 mg twice daily 7 days on/7 days off beginning 02/26/2013 due to nausea and diarrhea.  -Xeloda dose reduced to 500 mg twice daily, 7 days/14 days off 03/20/2013.  -Xeloda discontinued 04/25/2013 due to progressive hand foot syndrome.  -CEA elevated at 9.1 on 04/25/2013.  -CEA stable at 9.5 on 05/09/2013.  -Initiation of infusional 5-FU as per the FOLFOX regimen 05/09/2013.  -CEA 7.4 on 05/24/2013.  -CEA 7.9 on 06/21/2013.  -CEA 9.3 on 08/22/2013 .  -CEA 11.1 on 10/01/2013.  -Restaging CT 10/01/2013 with persistent asymmetric gastric wall thickening in the greater curvature of the mid stomach. Dominant index gastrocolic lymph node decreased in size. Other smaller perigastric lymph nodes stable. No new or progressive findings in or around the stomach. Low-density subcapsular splenic lesion resolved.  3. Multiple colon polyps documented on a colonoscopy 06/22/2012-tubular adenomas, tubulovillous adenomas, and hyperplastic polyps  4. Family history of colon cancer (mother)  65. Pain secondary to the primary gastric mass or carcinomatosis -resolved  6. 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 Enrique Salinas, multiple extractions were recommended  7. Anorexia, nausea, and early satiety-improved  8. 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 Nausea following chemotherapy-improved with the addition of prophylactic Decadron  10. Skin rash-most likely secondary to Decadron, improved  11. History of neutropenia secondary to chemotherapy  12. 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  Nausea and diarrhea while treated with Xeloda. Improved.  15. Hand-foot syndrome 03/07/2013 manifesting with erythema, dry desquamation and pain on the palms and erythema and pain on the soles. Progressive hand pain 04/25/2013. Xeloda discontinued.  16. Anemia-microcytic; chronic, stable. Ferritin low at 6 11/21/2013. Oral iron initiated.  Disposition: [name] appears stable. Plan to continue 5-fluorouracil but we will adjust the schedule to every 4 weeks per his request. He will receive treatment today as planned. He will return for the next cycle on 01/31/2014. We will see him in followup prior to treatment on 02/27/2014. He will contact the office in the interim with any problems.  Plan reviewed with Dr. Benay Salinas.  Joseph Salinas ANP/GNP-BC   01/02/2014  9:35 AM

## 2014-01-04 ENCOUNTER — Ambulatory Visit (HOSPITAL_BASED_OUTPATIENT_CLINIC_OR_DEPARTMENT_OTHER): Payer: BC Managed Care – PPO

## 2014-01-04 DIAGNOSIS — C169 Malignant neoplasm of stomach, unspecified: Secondary | ICD-10-CM

## 2014-01-04 DIAGNOSIS — Z452 Encounter for adjustment and management of vascular access device: Secondary | ICD-10-CM

## 2014-01-04 MED ORDER — SODIUM CHLORIDE 0.9 % IJ SOLN
10.0000 mL | INTRAMUSCULAR | Status: DC | PRN
  Administered 2014-01-04: 10 mL
  Filled 2014-01-04: qty 10

## 2014-01-04 MED ORDER — HEPARIN SOD (PORK) LOCK FLUSH 100 UNIT/ML IV SOLN
500.0000 [IU] | Freq: Once | INTRAVENOUS | Status: AC | PRN
  Administered 2014-01-04: 500 [IU]
  Filled 2014-01-04: qty 5

## 2014-01-25 ENCOUNTER — Telehealth: Payer: Self-pay | Admitting: Oncology

## 2014-01-25 NOTE — Telephone Encounter (Signed)
Pt r/s appt from 01/31/14 to 01/30/14. Gave new cal with d/t.

## 2014-01-27 ENCOUNTER — Other Ambulatory Visit: Payer: Self-pay | Admitting: Oncology

## 2014-01-30 ENCOUNTER — Other Ambulatory Visit (HOSPITAL_BASED_OUTPATIENT_CLINIC_OR_DEPARTMENT_OTHER): Payer: BC Managed Care – PPO

## 2014-01-30 ENCOUNTER — Ambulatory Visit (HOSPITAL_BASED_OUTPATIENT_CLINIC_OR_DEPARTMENT_OTHER): Payer: BC Managed Care – PPO

## 2014-01-30 DIAGNOSIS — Z5111 Encounter for antineoplastic chemotherapy: Secondary | ICD-10-CM

## 2014-01-30 DIAGNOSIS — C169 Malignant neoplasm of stomach, unspecified: Secondary | ICD-10-CM

## 2014-01-30 LAB — CBC WITH DIFFERENTIAL/PLATELET
BASO%: 0.9 % (ref 0.0–2.0)
Basophils Absolute: 0.1 10*3/uL (ref 0.0–0.1)
EOS%: 2.7 % (ref 0.0–7.0)
Eosinophils Absolute: 0.2 10*3/uL (ref 0.0–0.5)
HEMATOCRIT: 35.4 % — AB (ref 38.4–49.9)
HGB: 10.8 g/dL — ABNORMAL LOW (ref 13.0–17.1)
LYMPH#: 2 10*3/uL (ref 0.9–3.3)
LYMPH%: 30.7 % (ref 14.0–49.0)
MCH: 22.6 pg — AB (ref 27.2–33.4)
MCHC: 30.5 g/dL — AB (ref 32.0–36.0)
MCV: 74.2 fL — ABNORMAL LOW (ref 79.3–98.0)
MONO#: 0.8 10*3/uL (ref 0.1–0.9)
MONO%: 12 % (ref 0.0–14.0)
NEUT#: 3.5 10*3/uL (ref 1.5–6.5)
NEUT%: 53.7 % (ref 39.0–75.0)
Platelets: 260 10*3/uL (ref 140–400)
RBC: 4.77 10*6/uL (ref 4.20–5.82)
RDW: 20.1 % — ABNORMAL HIGH (ref 11.0–14.6)
WBC: 6.6 10*3/uL (ref 4.0–10.3)

## 2014-01-30 LAB — COMPREHENSIVE METABOLIC PANEL (CC13)
ALK PHOS: 86 U/L (ref 40–150)
ALT: 11 U/L (ref 0–55)
AST: 11 U/L (ref 5–34)
Albumin: 3.7 g/dL (ref 3.5–5.0)
Anion Gap: 7 mEq/L (ref 3–11)
BUN: 7.8 mg/dL (ref 7.0–26.0)
CALCIUM: 9.1 mg/dL (ref 8.4–10.4)
CHLORIDE: 108 meq/L (ref 98–109)
CO2: 26 mEq/L (ref 22–29)
CREATININE: 0.9 mg/dL (ref 0.7–1.3)
Glucose: 102 mg/dl (ref 70–140)
Potassium: 4.4 mEq/L (ref 3.5–5.1)
Sodium: 141 mEq/L (ref 136–145)
Total Bilirubin: 0.27 mg/dL (ref 0.20–1.20)
Total Protein: 7.1 g/dL (ref 6.4–8.3)

## 2014-01-30 MED ORDER — SODIUM CHLORIDE 0.9 % IV SOLN
1920.0000 mg/m2 | INTRAVENOUS | Status: DC
  Administered 2014-01-30: 3450 mg via INTRAVENOUS
  Filled 2014-01-30: qty 69

## 2014-01-30 MED ORDER — SODIUM CHLORIDE 0.9 % IV SOLN
Freq: Once | INTRAVENOUS | Status: AC
  Administered 2014-01-30: 14:00:00 via INTRAVENOUS

## 2014-01-30 MED ORDER — PROCHLORPERAZINE MALEATE 10 MG PO TABS
10.0000 mg | ORAL_TABLET | Freq: Once | ORAL | Status: AC
  Administered 2014-01-30: 10 mg via ORAL

## 2014-01-30 MED ORDER — LEUCOVORIN CALCIUM INJECTION 350 MG
300.0000 mg/m2 | Freq: Once | INTRAMUSCULAR | Status: AC
  Administered 2014-01-30: 540 mg via INTRAVENOUS
  Filled 2014-01-30: qty 27

## 2014-01-30 MED ORDER — FLUOROURACIL CHEMO INJECTION 2.5 GM/50ML
300.0000 mg/m2 | Freq: Once | INTRAVENOUS | Status: AC
  Administered 2014-01-30: 550 mg via INTRAVENOUS
  Filled 2014-01-30: qty 11

## 2014-01-30 MED ORDER — PROCHLORPERAZINE MALEATE 10 MG PO TABS
ORAL_TABLET | ORAL | Status: AC
  Filled 2014-01-30: qty 1

## 2014-01-30 NOTE — Patient Instructions (Signed)
[facility] Discharge Instructions for Patients Receiving Chemotherapy  Today you received the following chemotherapy agents leucovorin/fluorouracil.    To help prevent nausea and vomiting after your treatment, we encourage you to take your nausea medication as directed.     If you develop nausea and vomiting that is not controlled by your nausea medication, call the clinic.   BELOW ARE SYMPTOMS THAT SHOULD BE REPORTED IMMEDIATELY:  *FEVER GREATER THAN 100.5 F  *CHILLS WITH OR WITHOUT FEVER  NAUSEA AND VOMITING THAT IS NOT CONTROLLED WITH YOUR NAUSEA MEDICATION  *UNUSUAL SHORTNESS OF BREATH  *UNUSUAL BRUISING OR BLEEDING  TENDERNESS IN MOUTH AND THROAT WITH OR WITHOUT PRESENCE OF ULCERS  *URINARY PROBLEMS  *BOWEL PROBLEMS  UNUSUAL RASH Items with * indicate a potential emergency and should be followed up as soon as possible.  Feel free to call the clinic you have any questions or concerns. The clinic phone number is [phone].  

## 2014-01-31 ENCOUNTER — Other Ambulatory Visit: Payer: Self-pay

## 2014-01-31 ENCOUNTER — Ambulatory Visit: Payer: Self-pay

## 2014-01-31 LAB — CEA: CEA: 11.7 ng/mL — ABNORMAL HIGH (ref 0.0–5.0)

## 2014-02-01 ENCOUNTER — Ambulatory Visit (HOSPITAL_BASED_OUTPATIENT_CLINIC_OR_DEPARTMENT_OTHER): Payer: BC Managed Care – PPO

## 2014-02-01 DIAGNOSIS — C169 Malignant neoplasm of stomach, unspecified: Secondary | ICD-10-CM

## 2014-02-01 DIAGNOSIS — Z452 Encounter for adjustment and management of vascular access device: Secondary | ICD-10-CM

## 2014-02-01 MED ORDER — SODIUM CHLORIDE 0.9 % IJ SOLN
10.0000 mL | INTRAMUSCULAR | Status: DC | PRN
  Administered 2014-02-01: 10 mL
  Filled 2014-02-01: qty 10

## 2014-02-01 MED ORDER — HEPARIN SOD (PORK) LOCK FLUSH 100 UNIT/ML IV SOLN
500.0000 [IU] | Freq: Once | INTRAVENOUS | Status: AC | PRN
  Administered 2014-02-01: 500 [IU]
  Filled 2014-02-01: qty 5

## 2014-02-01 NOTE — Patient Instructions (Signed)
Fluorouracil, 5FU; Diclofenac topical cream What is this medicine? FLUOROURACIL; DICLOFENAC (flure oh YOOR a sil; dye KLOE fen ak) is a combination of a topical chemotherapy agent and non-steroidal anti-inflammatory drug (NSAID). It is used on the skin to treat skin cancer and skin conditions that could become cancer. This medicine may be used for other purposes; ask your health care provider or pharmacist if you have questions. COMMON BRAND NAME(S): FLUORAC What should I tell my health care provider before I take this medicine? They need to know if you have any of these conditions: -bleeding problems -cigarette smoker -DPD enzyme deficiency -heart disease -high blood pressure -if you frequently drink alcohol containing drinks -kidney disease -liver disease -open or infected skin -stomach problems -swelling or open sores at the treatment site -recent or planned coronary artery bypass graft (CABG) surgery -an unusual or allergic reaction to fluorouracil, diclofenac, aspirin, other NSAIDs, other medicines, foods, dyes, or preservatives -pregnant or trying to get pregnant -breast-feeding How should I use this medicine? This medicine is only for use on the skin. Follow the directions on the prescription label. Wash hands before and after use. Wash affected area and gently pat dry. To apply this medicine use a cotton-tipped applicator, or use gloves if applying with fingertips. If applied with unprotected fingertips, it is very important to wash your hands well after you apply this medicine. Avoid applying to the eyes, nose, or mouth. Apply enough medicine to cover the affected area. You can cover the area with a light gauze dressing, but do not use tight or air-tight dressings. Finish the full course prescribed by your doctor or health care professional, even if you think your condition is better. Do not stop taking except on the advice of your doctor or health care professional. Talk to your  pediatrician regarding the use of this medicine in children. Special care may be needed. Overdosage: If you think you've taken too much of this medicine contact a poison control center or emergency room at once. Overdosage: If you think you have taken too much of this medicine contact a poison control center or emergency room at once. NOTE: This medicine is only for you. Do not share this medicine with others. What if I miss a dose? If you miss a dose, apply it as soon as you can. If it is almost time for your next dose, only use that dose. Do not apply extra doses. Contact your doctor or health care professional if you miss more than one dose. What may interact with this medicine? Interactions are not expected. Do not use any other skin products without telling your doctor or health care professional. This list may not describe all possible interactions. Give your health care provider a list of all the medicines, herbs, non-prescription drugs, or dietary supplements you use. Also tell them if you smoke, drink alcohol, or use illegal drugs. Some items may interact with your medicine. What should I watch for while using this medicine? Visit your doctor or health care professional for checks on your progress. You will need to use this medicine for 2 to 6 weeks. This may be longer depending on the condition being treated. You may not see full healing for another 1 to 2 months after you stop using the medicine. Treated areas of skin can look unsightly during and for several weeks after treatment with this medicine. This medicine can make you more sensitive to the sun. Keep out of the sun. If you cannot avoid being in   the sun, wear protective clothing and use sunscreen. Do not use sun lamps or tanning beds/booths. Where should I keep my What side effects may I notice from receiving this medicine? Side effects that you should report to your doctor or health care professional as soon as possible: -allergic  reactions like skin rash, itching or hives, swelling of the face, lips, or tongue -black or bloody stools, blood in the urine or vomit -blurred vision -chest pain -difficulty breathing or wheezing -redness, blistering, peeling or loosening of the skin, including inside the mouth -severe redness and swelling of normal skin -slurred speech or weakness on one side of the body -trouble passing urine or change in the amount of urine -unexplained weight gain or swelling -unusually weak or tired -yellowing of eyes or skin Side effects that usually do not require medical attention (Report these to your doctor or health care professional if they continue or are bothersome.): -increased sensitivity of the skin to sun and ultraviolet light -pain and burning of the affected area -scaling or swelling of the affected area -skin rash, itching of the affected area -tenderness This list may not describe all possible side effects. Call your doctor for medical advice about side effects. You may report side effects to FDA at 1-800-FDA-1088. Where should I keep my medicine? Keep out of the reach of children. Store at room temperature between 20 and 25 degrees C (68 and 77 degrees F). Throw away any unused medicine after the expiration date. NOTE: This sheet is a summary. It may not cover all possible information. If you have questions about this medicine, talk to your doctor, pharmacist, or health care provider.  2015, [employer]vier/Gold Standard. (2013-07-09 11:09:58)  

## 2014-02-24 ENCOUNTER — Other Ambulatory Visit: Payer: Self-pay | Admitting: Oncology

## 2014-02-27 ENCOUNTER — Telehealth: Payer: Self-pay | Admitting: Oncology

## 2014-02-27 ENCOUNTER — Ambulatory Visit (HOSPITAL_BASED_OUTPATIENT_CLINIC_OR_DEPARTMENT_OTHER): Payer: BC Managed Care – PPO

## 2014-02-27 ENCOUNTER — Ambulatory Visit (HOSPITAL_BASED_OUTPATIENT_CLINIC_OR_DEPARTMENT_OTHER): Payer: BC Managed Care – PPO | Admitting: Oncology

## 2014-02-27 ENCOUNTER — Telehealth: Payer: Self-pay | Admitting: *Deleted

## 2014-02-27 ENCOUNTER — Other Ambulatory Visit (HOSPITAL_BASED_OUTPATIENT_CLINIC_OR_DEPARTMENT_OTHER): Payer: BC Managed Care – PPO

## 2014-02-27 VITALS — BP 135/81 | HR 72 | Temp 98.3°F | Resp 18 | Ht 63.0 in | Wt 159.2 lb

## 2014-02-27 DIAGNOSIS — C169 Malignant neoplasm of stomach, unspecified: Secondary | ICD-10-CM

## 2014-02-27 DIAGNOSIS — C786 Secondary malignant neoplasm of retroperitoneum and peritoneum: Secondary | ICD-10-CM

## 2014-02-27 DIAGNOSIS — Z5111 Encounter for antineoplastic chemotherapy: Secondary | ICD-10-CM

## 2014-02-27 DIAGNOSIS — R630 Anorexia: Secondary | ICD-10-CM

## 2014-02-27 DIAGNOSIS — G620 Drug-induced polyneuropathy: Secondary | ICD-10-CM

## 2014-02-27 DIAGNOSIS — R110 Nausea: Secondary | ICD-10-CM

## 2014-02-27 DIAGNOSIS — R21 Rash and other nonspecific skin eruption: Secondary | ICD-10-CM

## 2014-02-27 DIAGNOSIS — D509 Iron deficiency anemia, unspecified: Secondary | ICD-10-CM

## 2014-02-27 DIAGNOSIS — K5900 Constipation, unspecified: Secondary | ICD-10-CM

## 2014-02-27 DIAGNOSIS — R6881 Early satiety: Secondary | ICD-10-CM

## 2014-02-27 LAB — CBC WITH DIFFERENTIAL/PLATELET
BASO%: 1.2 % (ref 0.0–2.0)
BASOS ABS: 0.1 10*3/uL (ref 0.0–0.1)
EOS%: 2.2 % (ref 0.0–7.0)
Eosinophils Absolute: 0.2 10*3/uL (ref 0.0–0.5)
HEMATOCRIT: 34.7 % — AB (ref 38.4–49.9)
HEMOGLOBIN: 10.3 g/dL — AB (ref 13.0–17.1)
LYMPH%: 23.6 % (ref 14.0–49.0)
MCH: 21.8 pg — ABNORMAL LOW (ref 27.2–33.4)
MCHC: 29.8 g/dL — ABNORMAL LOW (ref 32.0–36.0)
MCV: 73.2 fL — AB (ref 79.3–98.0)
MONO#: 0.8 10*3/uL (ref 0.1–0.9)
MONO%: 12.1 % (ref 0.0–14.0)
NEUT#: 4.2 10*3/uL (ref 1.5–6.5)
NEUT%: 60.9 % (ref 39.0–75.0)
PLATELETS: 276 10*3/uL (ref 140–400)
RBC: 4.74 10*6/uL (ref 4.20–5.82)
RDW: 20.7 % — ABNORMAL HIGH (ref 11.0–14.6)
WBC: 6.9 10*3/uL (ref 4.0–10.3)
lymph#: 1.6 10*3/uL (ref 0.9–3.3)

## 2014-02-27 MED ORDER — SODIUM CHLORIDE 0.9 % IV SOLN
1920.0000 mg/m2 | INTRAVENOUS | Status: DC
  Administered 2014-02-27: 3450 mg via INTRAVENOUS
  Filled 2014-02-27: qty 69

## 2014-02-27 MED ORDER — PROCHLORPERAZINE MALEATE 10 MG PO TABS
ORAL_TABLET | ORAL | Status: AC
  Filled 2014-02-27: qty 1

## 2014-02-27 MED ORDER — LEUCOVORIN CALCIUM INJECTION 350 MG
300.0000 mg/m2 | Freq: Once | INTRAVENOUS | Status: AC
  Administered 2014-02-27: 540 mg via INTRAVENOUS
  Filled 2014-02-27: qty 27

## 2014-02-27 MED ORDER — PROCHLORPERAZINE MALEATE 10 MG PO TABS
10.0000 mg | ORAL_TABLET | Freq: Once | ORAL | Status: AC
  Administered 2014-02-27: 10 mg via ORAL

## 2014-02-27 MED ORDER — FLUOROURACIL CHEMO INJECTION 2.5 GM/50ML
300.0000 mg/m2 | Freq: Once | INTRAVENOUS | Status: AC
  Administered 2014-02-27: 550 mg via INTRAVENOUS
  Filled 2014-02-27: qty 11

## 2014-02-27 MED ORDER — SODIUM CHLORIDE 0.9 % IV SOLN
Freq: Once | INTRAVENOUS | Status: AC
  Administered 2014-02-27: 10:00:00 via INTRAVENOUS

## 2014-02-27 NOTE — Telephone Encounter (Signed)
Pt confirmed labs/ov per 12/09 POF, gave pt AVS.... KJ, sent msg to chemo

## 2014-02-27 NOTE — Telephone Encounter (Signed)
Per staff message and POF I have scheduled appts. Advised scheduler of appts. JMW  

## 2014-02-27 NOTE — Patient Instructions (Signed)
[facility] Discharge Instructions for Patients Receiving Chemotherapy  Today you received the following chemotherapy agents: Leucovorin, 5FU  To help prevent nausea and vomiting after your treatment, we encourage you to take your nausea medication as prescribed by your physician.   If you develop nausea and vomiting that is not controlled by your nausea medication, call the clinic.   BELOW ARE SYMPTOMS THAT SHOULD BE REPORTED IMMEDIATELY:  *FEVER GREATER THAN 100.5 F  *CHILLS WITH OR WITHOUT FEVER  NAUSEA AND VOMITING THAT IS NOT CONTROLLED WITH YOUR NAUSEA MEDICATION  *UNUSUAL SHORTNESS OF BREATH  *UNUSUAL BRUISING OR BLEEDING  TENDERNESS IN MOUTH AND THROAT WITH OR WITHOUT PRESENCE OF ULCERS  *URINARY PROBLEMS  *BOWEL PROBLEMS  UNUSUAL RASH Items with * indicate a potential emergency and should be followed up as soon as possible.  Feel free to call the clinic you have any questions or concerns. The clinic phone number is [phone].    

## 2014-02-27 NOTE — Progress Notes (Signed)
[facility] OFFICE PROGRESS NOTE   Diagnosis: Gastric cancer  INTERVAL HISTORY:   He returns as scheduled. He is now maintained on monthly infusional 5-fluorouracil. He reports reflux symptoms in the evening. Constipation is relieved with Colace. Good appetite. He relates weight loss to not being able to eat certain foods secondary to lack of teeth. The hands feel better.  Objective:  Vital signs in last 24 hours:  Blood pressure 135/81, pulse 72, temperature 98.3 F (36.8 C), temperature source Oral, resp. rate 18, height _0  (1.6 m), weight 159 lb 3.2 oz (72.213 kg).    HEENT: No thrush or ulcers. Resp: Lungs clear bilaterally Cardio: Regular rate and rhythm GI: No hepatomegaly, no mass, no apparent ascites Vascular: No leg edema     Portacath/PICC-without erythema  Lab Results:  Lab Results  Component Value Date   WBC 6.9 02/27/2014   HGB 10.3* 02/27/2014   HCT 34.7* 02/27/2014   MCV 73.2* 02/27/2014   PLT 276 02/27/2014   NEUTROABS 4.2 02/27/2014    Lab Results  Component Value Date   CEA 11.7* 01/30/2014    Medications: I have reviewed the patient's current medications.  Assessment/Plan: 1.Metastatic gastric cancer-status post biopsy of a gastric mass on 07/20/2012 with the pathology confirming adenocarcinoma, HER-2 non-amplified.  - CT 07/18/2012 consistent with a primary gastric mass and metastatic omental/peritoneal nodules  -biopsy of a peritoneal nodule 07/31/2012 consistent with metastatic adenocarcinoma  -Cycle 1 of FOLFOX on 08/02/2012  -Restaging CT 10/23/2012 consistent with a partial response-decrease in the primary gastric tumor and omental/peritoneal implants  -cycle 10 of FOLFOX completed 01/11/2013  -normal CEA 01/11/2013  -Restaging CT 01/23/2013 with improvement in the peritoneal metastases and an increased size of an indeterminate splenic lesion  -Initiation of maintenance Xeloda therapy on a 7 day on/7 day off  schedule 01/29/2013.  -Xeloda dose reduced to 1000 mg twice daily 7 days on/7 days off beginning 02/26/2013 due to nausea and diarrhea.  -Xeloda dose reduced to 500 mg twice daily, 7 days/14 days off 03/20/2013.  -Xeloda discontinued 04/25/2013 due to progressive hand foot syndrome.  -CEA elevated at 9.1 on 04/25/2013.  -CEA stable at 9.5 on 05/09/2013.  -Initiation of infusional 5-FU as per the FOLFOX regimen 05/09/2013.  -CEA 7.4 on 05/24/2013.  -CEA 7.9 on 06/21/2013.  -CEA 9.3 on 08/22/2013 .  -CEA 11.1 on 10/01/2013.  -Restaging CT 10/01/2013 with persistent asymmetric gastric wall thickening in the greater curvature of the mid stomach. Dominant index gastrocolic lymph node decreased in size. Other smaller perigastric lymph nodes stable. No new or progressive findings in or around the stomach. Low-density subcapsular splenic lesion resolved.  -Continuation of infusional 5-fluorouracil, switched to a 4 week schedule beginning 01/02/2014 3. Multiple colon polyps documented on a colonoscopy 06/22/2012-tubular adenomas, tubulovillous adenomas, and hyperplastic polyps  4. Family history of colon cancer (mother)  42. Pain secondary to the primary gastric mass or carcinomatosis -resolved  6. 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 Enrique Sack, multiple extractions were recommended  7. Anorexia, nausea, and early satiety-improved  8. 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 Nausea following chemotherapy-improved with the addition of prophylactic Decadron  10. Skin rash-most likely secondary to Decadron, improved  11. History of neutropenia secondary to chemotherapy  12. 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 Nausea and diarrhea while treated with Xeloda.  Improved.  15. Hand-foot syndrome 03/07/2013 manifesting with erythema, dry desquamation and pain on the palms and erythema and pain on the soles. Progressive hand pain 04/25/2013. Xeloda discontinued.  16. Anemia-microcytic; chronic, stable. Ferritin low at 6 11/21/2013. Oral iron initiated.   Disposition:  His overall status appears unchanged. He will complete another cycle of 5-fluorouracil today. He will undergo a restaging CT evaluation prior to the next office visit in one month.  We will ask the Cancer center social worker to meet with him today regarding dental care and financial issues.    Betsy Coder, MD  02/27/2014  10:17 AM

## 2014-02-28 ENCOUNTER — Encounter: Payer: Self-pay | Admitting: *Deleted

## 2014-02-28 NOTE — Progress Notes (Signed)
[facility] Work  Clinical Social Work was referred by nurse for assessment of psychosocial needs due to assistance obtaining dentures.  Clinical Social Worker contacted patient at home and spoke to wife. CSW had attempted to see pt after his appointment, but he had left. Wife reports teeth were extracted over a year ago and pt had emergency MCD that covered this. Pt has no dental insurance currently. CSW does not currently know of resources to assist with discounted dentures, but is attempting to locate possible options. CSW also made referral to dietician. CSW will meet with pt tomorrow after pump removal to discuss further.   Clinical Social Work interventions: Resource obtainment  Loren Racer, [place] Worker [place]  [place] Phone: [phone] Fax: [phone]

## 2014-03-01 ENCOUNTER — Ambulatory Visit (HOSPITAL_BASED_OUTPATIENT_CLINIC_OR_DEPARTMENT_OTHER): Payer: BC Managed Care – PPO

## 2014-03-01 DIAGNOSIS — Z452 Encounter for adjustment and management of vascular access device: Secondary | ICD-10-CM

## 2014-03-01 DIAGNOSIS — C169 Malignant neoplasm of stomach, unspecified: Secondary | ICD-10-CM

## 2014-03-01 MED ORDER — HEPARIN SOD (PORK) LOCK FLUSH 100 UNIT/ML IV SOLN
500.0000 [IU] | Freq: Once | INTRAVENOUS | Status: AC | PRN
  Administered 2014-03-01: 500 [IU]
  Filled 2014-03-01: qty 5

## 2014-03-01 MED ORDER — SODIUM CHLORIDE 0.9 % IJ SOLN
10.0000 mL | INTRAMUSCULAR | Status: DC | PRN
  Administered 2014-03-01: 10 mL
  Filled 2014-03-01: qty 10

## 2014-03-01 NOTE — Patient Instructions (Signed)
Fluorouracil, 5FU; Diclofenac topical cream What is this medicine? FLUOROURACIL; DICLOFENAC (flure oh YOOR a sil; dye KLOE fen ak) is a combination of a topical chemotherapy agent and non-steroidal anti-inflammatory drug (NSAID). It is used on the skin to treat skin cancer and skin conditions that could become cancer. This medicine may be used for other purposes; ask your health care provider or pharmacist if you have questions. COMMON BRAND NAME(S): FLUORAC What should I tell my health care provider before I take this medicine? They need to know if you have any of these conditions: -bleeding problems -cigarette smoker -DPD enzyme deficiency -heart disease -high blood pressure -if you frequently drink alcohol containing drinks -kidney disease -liver disease -open or infected skin -stomach problems -swelling or open sores at the treatment site -recent or planned coronary artery bypass graft (CABG) surgery -an unusual or allergic reaction to fluorouracil, diclofenac, aspirin, other NSAIDs, other medicines, foods, dyes, or preservatives -pregnant or trying to get pregnant -breast-feeding How should I use this medicine? This medicine is only for use on the skin. Follow the directions on the prescription label. Wash hands before and after use. Wash affected area and gently pat dry. To apply this medicine use a cotton-tipped applicator, or use gloves if applying with fingertips. If applied with unprotected fingertips, it is very important to wash your hands well after you apply this medicine. Avoid applying to the eyes, nose, or mouth. Apply enough medicine to cover the affected area. You can cover the area with a light gauze dressing, but do not use tight or air-tight dressings. Finish the full course prescribed by your doctor or health care professional, even if you think your condition is better. Do not stop taking except on the advice of your doctor or health care professional. Talk to your  pediatrician regarding the use of this medicine in children. Special care may be needed. Overdosage: If you think you've taken too much of this medicine contact a poison control center or emergency room at once. Overdosage: If you think you have taken too much of this medicine contact a poison control center or emergency room at once. NOTE: This medicine is only for you. Do not share this medicine with others. What if I miss a dose? If you miss a dose, apply it as soon as you can. If it is almost time for your next dose, only use that dose. Do not apply extra doses. Contact your doctor or health care professional if you miss more than one dose. What may interact with this medicine? Interactions are not expected. Do not use any other skin products without telling your doctor or health care professional. This list may not describe all possible interactions. Give your health care provider a list of all the medicines, herbs, non-prescription drugs, or dietary supplements you use. Also tell them if you smoke, drink alcohol, or use illegal drugs. Some items may interact with your medicine. What should I watch for while using this medicine? Visit your doctor or health care professional for checks on your progress. You will need to use this medicine for 2 to 6 weeks. This may be longer depending on the condition being treated. You may not see full healing for another 1 to 2 months after you stop using the medicine. Treated areas of skin can look unsightly during and for several weeks after treatment with this medicine. This medicine can make you more sensitive to the sun. Keep out of the sun. If you cannot avoid being in   the sun, wear protective clothing and use sunscreen. Do not use sun lamps or tanning beds/booths. Where should I keep my What side effects may I notice from receiving this medicine? Side effects that you should report to your doctor or health care professional as soon as possible: -allergic  reactions like skin rash, itching or hives, swelling of the face, lips, or tongue -black or bloody stools, blood in the urine or vomit -blurred vision -chest pain -difficulty breathing or wheezing -redness, blistering, peeling or loosening of the skin, including inside the mouth -severe redness and swelling of normal skin -slurred speech or weakness on one side of the body -trouble passing urine or change in the amount of urine -unexplained weight gain or swelling -unusually weak or tired -yellowing of eyes or skin Side effects that usually do not require medical attention (Report these to your doctor or health care professional if they continue or are bothersome.): -increased sensitivity of the skin to sun and ultraviolet light -pain and burning of the affected area -scaling or swelling of the affected area -skin rash, itching of the affected area -tenderness This list may not describe all possible side effects. Call your doctor for medical advice about side effects. You may report side effects to FDA at 1-800-FDA-1088. Where should I keep my medicine? Keep out of the reach of children. Store at room temperature between 20 and 25 degrees C (68 and 77 degrees F). Throw away any unused medicine after the expiration date. NOTE: This sheet is a summary. It may not cover all possible information. If you have questions about this medicine, talk to your doctor, pharmacist, or health care provider.  2015, [employer]vier/Gold Standard. (2013-07-09 11:09:58)  

## 2014-03-06 ENCOUNTER — Encounter: Payer: Self-pay | Admitting: *Deleted

## 2014-03-06 NOTE — Progress Notes (Signed)
[facility] Work  Clinical Social Work researched denture resources for pt. There are very little options available. CSW phoned family and left vm and will mail the information to them. Most dentures are very expensive and they may have to do a payment plan.   Clinical Social Work interventions: Resource obtainment  Loren Racer, [place] Worker [place]  [place] Phone: [phone] Fax: [phone]

## 2014-03-26 ENCOUNTER — Ambulatory Visit (HOSPITAL_COMMUNITY)
Admission: RE | Admit: 2014-03-26 | Discharge: 2014-03-26 | Disposition: A | Discharge status: Home / Self Care | Payer: 59 | Source: Ambulatory Visit | Attending: Oncology | Admitting: Oncology

## 2014-03-26 ENCOUNTER — Other Ambulatory Visit (HOSPITAL_BASED_OUTPATIENT_CLINIC_OR_DEPARTMENT_OTHER): Payer: 59

## 2014-03-26 ENCOUNTER — Encounter (HOSPITAL_COMMUNITY): Payer: Self-pay

## 2014-03-26 DIAGNOSIS — C169 Malignant neoplasm of stomach, unspecified: Secondary | ICD-10-CM | POA: Diagnosis not present

## 2014-03-26 LAB — CBC WITH DIFFERENTIAL/PLATELET
BASO%: 1.1 % (ref 0.0–2.0)
BASOS ABS: 0.1 10*3/uL (ref 0.0–0.1)
EOS ABS: 0.2 10*3/uL (ref 0.0–0.5)
EOS%: 2.7 % (ref 0.0–7.0)
HEMATOCRIT: 35.3 % — AB (ref 38.4–49.9)
HEMOGLOBIN: 10.6 g/dL — AB (ref 13.0–17.1)
LYMPH#: 1.6 10*3/uL (ref 0.9–3.3)
LYMPH%: 20.6 % (ref 14.0–49.0)
MCH: 21.5 pg — ABNORMAL LOW (ref 27.2–33.4)
MCHC: 30 g/dL — ABNORMAL LOW (ref 32.0–36.0)
MCV: 71.6 fL — ABNORMAL LOW (ref 79.3–98.0)
MONO#: 0.8 10*3/uL (ref 0.1–0.9)
MONO%: 10.7 % (ref 0.0–14.0)
NEUT%: 64.9 % (ref 39.0–75.0)
NEUTROS ABS: 5 10*3/uL (ref 1.5–6.5)
Platelets: 263 10*3/uL (ref 140–400)
RBC: 4.94 10*6/uL (ref 4.20–5.82)
RDW: 20.2 % — AB (ref 11.0–14.6)
WBC: 7.8 10*3/uL (ref 4.0–10.3)

## 2014-03-26 LAB — COMPREHENSIVE METABOLIC PANEL (CC13)
ALBUMIN: 3.7 g/dL (ref 3.5–5.0)
ALK PHOS: 82 U/L (ref 40–150)
ALT: 8 U/L (ref 0–55)
AST: 12 U/L (ref 5–34)
Anion Gap: 6 mEq/L (ref 3–11)
BUN: 7.7 mg/dL (ref 7.0–26.0)
CO2: 29 meq/L (ref 22–29)
Calcium: 9 mg/dL (ref 8.4–10.4)
Chloride: 105 mEq/L (ref 98–109)
Creatinine: 0.9 mg/dL (ref 0.7–1.3)
Glucose: 102 mg/dl (ref 70–140)
POTASSIUM: 4.5 meq/L (ref 3.5–5.1)
SODIUM: 140 meq/L (ref 136–145)
Total Bilirubin: 0.39 mg/dL (ref 0.20–1.20)
Total Protein: 7.1 g/dL (ref 6.4–8.3)

## 2014-03-26 LAB — CEA: CEA: 18.1 ng/mL — ABNORMAL HIGH (ref 0.0–5.0)

## 2014-03-26 MED ORDER — IOHEXOL 300 MG/ML  SOLN
100.0000 mL | Freq: Once | INTRAMUSCULAR | Status: AC | PRN
  Administered 2014-03-26: 100 mL via INTRAVENOUS

## 2014-03-27 ENCOUNTER — Ambulatory Visit (HOSPITAL_BASED_OUTPATIENT_CLINIC_OR_DEPARTMENT_OTHER): Payer: 59 | Admitting: Oncology

## 2014-03-27 ENCOUNTER — Telehealth: Payer: Self-pay | Admitting: Oncology

## 2014-03-27 ENCOUNTER — Ambulatory Visit: Payer: 59 | Admitting: Nutrition

## 2014-03-27 ENCOUNTER — Telehealth: Payer: Self-pay | Admitting: *Deleted

## 2014-03-27 ENCOUNTER — Ambulatory Visit (HOSPITAL_BASED_OUTPATIENT_CLINIC_OR_DEPARTMENT_OTHER): Payer: 59

## 2014-03-27 VITALS — BP 133/82 | HR 72 | Temp 98.0°F | Resp 18 | Ht 63.0 in | Wt 157.4 lb

## 2014-03-27 DIAGNOSIS — R6881 Early satiety: Secondary | ICD-10-CM

## 2014-03-27 DIAGNOSIS — L270 Generalized skin eruption due to drugs and medicaments taken internally: Secondary | ICD-10-CM

## 2014-03-27 DIAGNOSIS — R110 Nausea: Secondary | ICD-10-CM

## 2014-03-27 DIAGNOSIS — C786 Secondary malignant neoplasm of retroperitoneum and peritoneum: Secondary | ICD-10-CM | POA: Diagnosis not present

## 2014-03-27 DIAGNOSIS — C169 Malignant neoplasm of stomach, unspecified: Secondary | ICD-10-CM

## 2014-03-27 DIAGNOSIS — G620 Drug-induced polyneuropathy: Secondary | ICD-10-CM | POA: Diagnosis not present

## 2014-03-27 DIAGNOSIS — D509 Iron deficiency anemia, unspecified: Secondary | ICD-10-CM | POA: Diagnosis not present

## 2014-03-27 DIAGNOSIS — Z5111 Encounter for antineoplastic chemotherapy: Secondary | ICD-10-CM

## 2014-03-27 DIAGNOSIS — R630 Anorexia: Secondary | ICD-10-CM

## 2014-03-27 DIAGNOSIS — R970 Elevated carcinoembryonic antigen [CEA]: Secondary | ICD-10-CM

## 2014-03-27 MED ORDER — SODIUM CHLORIDE 0.9 % IV SOLN
Freq: Once | INTRAVENOUS | Status: AC
  Administered 2014-03-27: 11:00:00 via INTRAVENOUS

## 2014-03-27 MED ORDER — DEXAMETHASONE SODIUM PHOSPHATE 10 MG/ML IJ SOLN
10.0000 mg | Freq: Once | INTRAMUSCULAR | Status: AC
  Administered 2014-03-27: 10 mg via INTRAVENOUS

## 2014-03-27 MED ORDER — PALONOSETRON HCL INJECTION 0.25 MG/5ML
INTRAVENOUS | Status: AC
  Filled 2014-03-27: qty 5

## 2014-03-27 MED ORDER — PALONOSETRON HCL INJECTION 0.25 MG/5ML
0.2500 mg | Freq: Once | INTRAVENOUS | Status: AC
  Administered 2014-03-27: 0.25 mg via INTRAVENOUS

## 2014-03-27 MED ORDER — FLUOROURACIL CHEMO INJECTION 2.5 GM/50ML
300.0000 mg/m2 | Freq: Once | INTRAVENOUS | Status: AC
  Administered 2014-03-27: 550 mg via INTRAVENOUS
  Filled 2014-03-27: qty 11

## 2014-03-27 MED ORDER — DEXTROSE 5 % IV SOLN
85.0000 mg/m2 | Freq: Once | INTRAVENOUS | Status: AC
  Administered 2014-03-27: 155 mg via INTRAVENOUS
  Filled 2014-03-27: qty 31

## 2014-03-27 MED ORDER — SODIUM CHLORIDE 0.9 % IV SOLN
1920.0000 mg/m2 | INTRAVENOUS | Status: DC
  Administered 2014-03-27: 3450 mg via INTRAVENOUS
  Filled 2014-03-27: qty 69

## 2014-03-27 MED ORDER — DEXAMETHASONE SODIUM PHOSPHATE 10 MG/ML IJ SOLN
INTRAMUSCULAR | Status: AC
  Filled 2014-03-27: qty 1

## 2014-03-27 MED ORDER — LEUCOVORIN CALCIUM INJECTION 350 MG
300.0000 mg/m2 | Freq: Once | INTRAVENOUS | Status: AC
  Administered 2014-03-27: 540 mg via INTRAVENOUS
  Filled 2014-03-27: qty 27

## 2014-03-27 NOTE — Telephone Encounter (Signed)
Per staff message and POF I have scheduled appts. Advised scheduler of appts and first avaialble appt given . JMW

## 2014-03-27 NOTE — Telephone Encounter (Signed)
Gave avs & cal for Jan. Sent mess to sch tx. °

## 2014-03-27 NOTE — Progress Notes (Signed)
Nutrition follow up completed with patient in chemotherapy. Patient reports difficulty chewing secondary to no teeth. Interested in information on other sources of protein in diet besides meat. Weight documented as 157.4 pounds which is down from 168 pounds July 2015. He denies nutrition impact symptoms.  Nutrition Diagnosis:  Food and Nutrition Related Knowledge Deficit related to difficulty chewing as evidenced by patient's self report and teeth extractions without dentures.  Intervention:  Educated patient on strategies for increasing calories and protein in small frequent meals. Encouraged chopped meats with extra sauces, condiments, or gravies. Recommended increased dried beans and peas as well as eggs; all well tolerated by patient. Patient declined oral nutrition supplements. Questions answered. Teach back method used.  Monitoring, Evaluation, Goals:  Patient will increase oral intake to minimize weight loss.  Next Visit: To be scheduled as needed. Nutrition Diagnosis resolved.

## 2014-03-27 NOTE — Telephone Encounter (Signed)
Per staff message and POF I have scheduled appts. Advised scheduler of appts. JMW  

## 2014-03-27 NOTE — Patient Instructions (Signed)
[facility] Discharge Instructions for Patients Receiving Chemotherapy  Today you received the following chemotherapy agents FOLFOX.   To help prevent nausea and vomiting after your treatment, we encourage you to take your nausea medication as directed.    If you develop nausea and vomiting that is not controlled by your nausea medication, call the clinic.   BELOW ARE SYMPTOMS THAT SHOULD BE REPORTED IMMEDIATELY:  *FEVER GREATER THAN 100.5 F  *CHILLS WITH OR WITHOUT FEVER  NAUSEA AND VOMITING THAT IS NOT CONTROLLED WITH YOUR NAUSEA MEDICATION  *UNUSUAL SHORTNESS OF BREATH  *UNUSUAL BRUISING OR BLEEDING  TENDERNESS IN MOUTH AND THROAT WITH OR WITHOUT PRESENCE OF ULCERS  *URINARY PROBLEMS  *BOWEL PROBLEMS  UNUSUAL RASH Items with * indicate a potential emergency and should be followed up as soon as possible.  Feel free to call the clinic you have any questions or concerns. The clinic phone number is [phone].    

## 2014-03-27 NOTE — Progress Notes (Signed)
[place] OFFICE PROGRESS NOTE   Diagnosis: Gastric cancer  INTERVAL HISTORY:   Joseph Salinas returns as scheduled. He continues every 4 week 5-fluorouracil, last given 02/27/2014. He reports increased nausea and early satiety. He is having bowel movements. No abdominal pain. No neuropathy symptoms.  Objective:  Vital signs in last 24 hours:  Blood pressure 133/82, pulse 72, temperature 98 F (36.7 C), temperature source Oral, resp. rate 18, height 5' 3"  (1.6 m), weight 157 lb 6.4 oz (71.396 kg), SpO2 100 %.    HEENT: No thrush or ulcers Resp: Lungs clear bilaterally Cardio: Regular rate and rhythm GI: No mass, no hepatomegaly, no apparent ascites Vascular: No leg edema    Portacath/PICC-without erythema  Lab Results:  Lab Results  Component Value Date   WBC 7.8 03/26/2014   HGB 10.6* 03/26/2014   HCT 35.3* 03/26/2014   MCV 71.6* 03/26/2014   PLT 263 03/26/2014   NEUTROABS 5.0 03/26/2014      Lab Results  Component Value Date   CEA 18.1* 03/26/2014    Imaging:  Ct Abdomen Pelvis W Contrast  03/26/2014   CLINICAL DATA:  Gastric cancer.  EXAM: CT ABDOMEN AND PELVIS WITH CONTRAST  TECHNIQUE: Multidetector CT imaging of the abdomen and pelvis was performed using the standard protocol following bolus administration of intravenous contrast.  CONTRAST:  125m OMNIPAQUE IOHEXOL 300 MG/ML  SOLN  COMPARISON:  10/01/2013  FINDINGS: Lower chest: No pleural effusion identified. The lung bases appear clear.  Hepatobiliary: Stable appearance of multiple low-attenuation foci within the liver compatible with simple cysts. There is a new, suspicious area of low attenuation within the lateral right hepatic lobe, image 14/series 2 and image 49/ series 602. Gallbladder is normal. There is no biliary dilatation.  Pancreas: Normal appearance of the pancreas.  Spleen: The spleen is unremarkable.  Adrenals/Urinary Tract: Normal appearance of both adrenal glands. The kidneys are  both normal. Urinary bladder appears normal.  Stomach/Bowel: The there has been increase in size of gastric wall mass which now has an exophytic component, image 18/series 2. The endoluminal component measures 7.4 x 3.5 cm, image 15/series 2. This is compared with 3.4 x 2.4 cm previously. The new exophytic component measures 3.1 x 2.6 cm, image 18/series 2.  Vascular/Lymphatic: Normal appearance of the abdominal aorta. Perigastric soft tissue nodule measures 1.6 cm, image number 22/ series 2. Previously 0.8 cm. No retroperitoneal adenopathy. There is no enlarged pelvic or inguinal lymph nodes.  Reproductive: Prostate gland appears enlarged.  Other: No ascites.  No focal fluid collections identified.  Musculoskeletal: Review of the visualized osseous structures is negative for aggressive lytic or sclerotic bone lesion.  IMPRESSION: 1. Interval progression of disease. The primary tumor involving the gastric wall has increased in size from previous exam and there is a new exophytic component to the tumor. 2. Suspect new right hepatic lobe metastasis 3. Increase in size of perigastric soft tissue nodule.   Electronically Signed   By: TKerby MoorsM.D.   On: 03/26/2014 12:06    Medications: I have reviewed the patient's current medications.  Assessment/Plan: 1.Metastatic gastric cancer-status post biopsy of a gastric mass on 07/20/2012 with the pathology confirming adenocarcinoma, HER-2 non-amplified.  - CT 07/18/2012 consistent with a primary gastric mass and metastatic omental/peritoneal nodules  -biopsy of a peritoneal nodule 07/31/2012 consistent with metastatic adenocarcinoma  -Cycle 1 of FOLFOX on 08/02/2012  -Restaging CT 10/23/2012 consistent with a partial response-decrease in the primary gastric tumor and omental/peritoneal implants  -  cycle 10 of FOLFOX completed 01/11/2013  -normal CEA 01/11/2013  -Restaging CT 01/23/2013 with improvement in the peritoneal metastases and an increased size  of an indeterminate splenic lesion  -Initiation of maintenance Xeloda therapy on a 7 day on/7 day off schedule 01/29/2013.  -Xeloda dose reduced to 1000 mg twice daily 7 days on/7 days off beginning 02/26/2013 due to nausea and diarrhea.  -Xeloda dose reduced to 500 mg twice daily, 7 days/14 days off 03/20/2013.  -Xeloda discontinued 04/25/2013 due to progressive hand foot syndrome.  -CEA elevated at 9.1 on 04/25/2013.  -CEA stable at 9.5 on 05/09/2013.  -Initiation of infusional 5-FU as per the FOLFOX regimen 05/09/2013.  -CEA 7.4 on 05/24/2013.  -CEA 7.9 on 06/21/2013.  -CEA 9.3 on 08/22/2013 .  -CEA 11.1 on 10/01/2013.  -Restaging CT 10/01/2013 with persistent asymmetric gastric wall thickening in the greater curvature of the mid stomach. Dominant index gastrocolic lymph node decreased in size. Other smaller perigastric lymph nodes stable. No new or progressive findings in or around the stomach. Low-density subcapsular splenic lesion resolved.  -Continuation of infusional 5-fluorouracil, switched to a 4 week schedule beginning 01/02/2014 -CT 03/26/2014 with enlargement of the gastric mass and a possible new liver lesion -FOLFOX chemotherapy resumed 03/27/2014 3. Multiple colon polyps documented on a colonoscopy 06/22/2012-tubular adenomas, tubulovillous adenomas, and hyperplastic polyps  4. Family history of colon cancer (mother)  98. Pain secondary to the primary gastric mass or carcinomatosis -resolved  6. 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 Enrique Sack, multiple extractions were recommended  7. Anorexia, nausea, and early satiety-improved  8. 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 Nausea following chemotherapy-improved with the addition of prophylactic Decadron  10. Skin rash-most likely secondary to Decadron, improved  11. History of neutropenia secondary to chemotherapy  12. 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 Nausea and diarrhea while treated with Xeloda. Improved.  15. Hand-foot syndrome 03/07/2013 manifesting with erythema, dry desquamation and pain on the palms and erythema and pain on the soles. Progressive hand pain 04/25/2013. Xeloda discontinued.  16. Anemia-microcytic; chronic, stable. Ferritin low at 6 11/21/2013. Oral iron initiated.   Disposition:  There is clinical and x-ray evidence of disease progression. The CEA is higher. I reviewed the restaging CT images with Joseph Salinas and his wife. We discussed treatment options.  He was last treated with oxaliplatin in October 2014. He had a response to treatment with FOLFOX in 2014. I recommend resuming FOLFOX chemotherapy. He understands the potential for neuropathy symptoms and an allergic reaction. Joseph Salinas agrees to proceed. The plan is to resume FOLFOX chemotherapy today.  He will be placed on Zantac. He will contact us for increased nausea.  Betsy Coder, MD  03/27/2014  10:57 AM

## 2014-03-29 ENCOUNTER — Ambulatory Visit (HOSPITAL_BASED_OUTPATIENT_CLINIC_OR_DEPARTMENT_OTHER): Payer: 59

## 2014-03-29 DIAGNOSIS — C169 Malignant neoplasm of stomach, unspecified: Secondary | ICD-10-CM

## 2014-03-29 DIAGNOSIS — Z452 Encounter for adjustment and management of vascular access device: Secondary | ICD-10-CM

## 2014-03-29 MED ORDER — HEPARIN SOD (PORK) LOCK FLUSH 100 UNIT/ML IV SOLN
500.0000 [IU] | Freq: Once | INTRAVENOUS | Status: AC | PRN
  Administered 2014-03-29: 500 [IU]
  Filled 2014-03-29: qty 5

## 2014-03-29 MED ORDER — SODIUM CHLORIDE 0.9 % IJ SOLN
10.0000 mL | INTRAMUSCULAR | Status: DC | PRN
  Administered 2014-03-29: 10 mL
  Filled 2014-03-29: qty 10

## 2014-04-03 ENCOUNTER — Telehealth: Payer: Self-pay | Admitting: Nurse Practitioner

## 2014-04-03 ENCOUNTER — Other Ambulatory Visit: Payer: Self-pay | Admitting: Nurse Practitioner

## 2014-04-03 DIAGNOSIS — C169 Malignant neoplasm of stomach, unspecified: Secondary | ICD-10-CM

## 2014-04-03 NOTE — Telephone Encounter (Signed)
Confirm appt for Genetics.

## 2014-04-04 ENCOUNTER — Encounter: Payer: 59 | Admitting: Genetic Counselor

## 2014-04-07 ENCOUNTER — Other Ambulatory Visit: Payer: Self-pay | Admitting: Oncology

## 2014-04-10 ENCOUNTER — Other Ambulatory Visit (HOSPITAL_BASED_OUTPATIENT_CLINIC_OR_DEPARTMENT_OTHER): Payer: 59

## 2014-04-10 ENCOUNTER — Telehealth: Payer: Self-pay | Admitting: *Deleted

## 2014-04-10 ENCOUNTER — Telehealth: Payer: Self-pay | Admitting: Oncology

## 2014-04-10 ENCOUNTER — Ambulatory Visit (HOSPITAL_BASED_OUTPATIENT_CLINIC_OR_DEPARTMENT_OTHER): Payer: 59 | Admitting: Nurse Practitioner

## 2014-04-10 ENCOUNTER — Ambulatory Visit (HOSPITAL_BASED_OUTPATIENT_CLINIC_OR_DEPARTMENT_OTHER): Payer: 59

## 2014-04-10 VITALS — BP 116/72 | HR 78 | Temp 98.4°F | Resp 18 | Ht 63.0 in | Wt 156.0 lb

## 2014-04-10 DIAGNOSIS — C169 Malignant neoplasm of stomach, unspecified: Secondary | ICD-10-CM

## 2014-04-10 DIAGNOSIS — Z5111 Encounter for antineoplastic chemotherapy: Secondary | ICD-10-CM

## 2014-04-10 DIAGNOSIS — C786 Secondary malignant neoplasm of retroperitoneum and peritoneum: Secondary | ICD-10-CM

## 2014-04-10 DIAGNOSIS — R112 Nausea with vomiting, unspecified: Secondary | ICD-10-CM

## 2014-04-10 LAB — COMPREHENSIVE METABOLIC PANEL (CC13)
ALT: 9 U/L (ref 0–55)
AST: 11 U/L (ref 5–34)
Albumin: 3.6 g/dL (ref 3.5–5.0)
Alkaline Phosphatase: 70 U/L (ref 40–150)
Anion Gap: 7 mEq/L (ref 3–11)
BILIRUBIN TOTAL: 0.4 mg/dL (ref 0.20–1.20)
BUN: 10.1 mg/dL (ref 7.0–26.0)
CO2: 28 mEq/L (ref 22–29)
Calcium: 8.6 mg/dL (ref 8.4–10.4)
Chloride: 107 mEq/L (ref 98–109)
Creatinine: 0.9 mg/dL (ref 0.7–1.3)
Glucose: 94 mg/dl (ref 70–140)
Potassium: 3.8 mEq/L (ref 3.5–5.1)
SODIUM: 142 meq/L (ref 136–145)
Total Protein: 6.7 g/dL (ref 6.4–8.3)

## 2014-04-10 LAB — CBC WITH DIFFERENTIAL/PLATELET
BASO%: 1.2 % (ref 0.0–2.0)
Basophils Absolute: 0.1 10*3/uL (ref 0.0–0.1)
EOS%: 1.8 % (ref 0.0–7.0)
Eosinophils Absolute: 0.1 10*3/uL (ref 0.0–0.5)
HEMATOCRIT: 33.7 % — AB (ref 38.4–49.9)
HEMOGLOBIN: 10 g/dL — AB (ref 13.0–17.1)
LYMPH#: 1.5 10*3/uL (ref 0.9–3.3)
LYMPH%: 29.7 % (ref 14.0–49.0)
MCH: 21.2 pg — ABNORMAL LOW (ref 27.2–33.4)
MCHC: 29.8 g/dL — ABNORMAL LOW (ref 32.0–36.0)
MCV: 71.2 fL — ABNORMAL LOW (ref 79.3–98.0)
MONO#: 0.6 10*3/uL (ref 0.1–0.9)
MONO%: 12.6 % (ref 0.0–14.0)
NEUT#: 2.8 10*3/uL (ref 1.5–6.5)
NEUT%: 54.7 % (ref 39.0–75.0)
PLATELETS: 260 10*3/uL (ref 140–400)
RBC: 4.73 10*6/uL (ref 4.20–5.82)
RDW: 20.3 % — ABNORMAL HIGH (ref 11.0–14.6)
WBC: 5.1 10*3/uL (ref 4.0–10.3)

## 2014-04-10 MED ORDER — SODIUM CHLORIDE 0.9 % IV SOLN: Freq: Once | INTRAVENOUS | Status: DC

## 2014-04-10 MED ORDER — LORAZEPAM 0.5 MG PO TABS: 0.5000 mg | ORAL_TABLET | Freq: Three times a day (TID) | ORAL | Status: DC | PRN

## 2014-04-10 MED ORDER — SODIUM CHLORIDE 0.9 % IV SOLN
1920.0000 mg/m2 | INTRAVENOUS | Status: DC
  Administered 2014-04-10: 3450 mg via INTRAVENOUS
  Filled 2014-04-10: qty 69

## 2014-04-10 MED ORDER — DEXAMETHASONE SODIUM PHOSPHATE 10 MG/ML IJ SOLN
INTRAMUSCULAR | Status: AC
  Filled 2014-04-10: qty 1

## 2014-04-10 MED ORDER — PALONOSETRON HCL INJECTION 0.25 MG/5ML
0.2500 mg | Freq: Once | INTRAVENOUS | Status: AC
  Administered 2014-04-10: 0.25 mg via INTRAVENOUS

## 2014-04-10 MED ORDER — LEUCOVORIN CALCIUM INJECTION 350 MG
300.0000 mg/m2 | Freq: Once | INTRAVENOUS | Status: AC
  Administered 2014-04-10: 540 mg via INTRAVENOUS
  Filled 2014-04-10: qty 27

## 2014-04-10 MED ORDER — DEXAMETHASONE SODIUM PHOSPHATE 10 MG/ML IJ SOLN
10.0000 mg | Freq: Once | INTRAMUSCULAR | Status: AC
  Administered 2014-04-10: 10 mg via INTRAVENOUS

## 2014-04-10 MED ORDER — FOSAPREPITANT DIMEGLUMINE INJECTION 150 MG
150.0000 mg | Freq: Once | INTRAVENOUS | Status: AC
  Administered 2014-04-10: 150 mg via INTRAVENOUS
  Filled 2014-04-10: qty 5

## 2014-04-10 MED ORDER — PALONOSETRON HCL INJECTION 0.25 MG/5ML
INTRAVENOUS | Status: AC
  Filled 2014-04-10: qty 5

## 2014-04-10 MED ORDER — OXALIPLATIN CHEMO INJECTION 100 MG/20ML
85.0000 mg/m2 | Freq: Once | INTRAVENOUS | Status: AC
  Administered 2014-04-10: 155 mg via INTRAVENOUS
  Filled 2014-04-10: qty 31

## 2014-04-10 MED ORDER — FLUOROURACIL CHEMO INJECTION 2.5 GM/50ML
300.0000 mg/m2 | Freq: Once | INTRAVENOUS | Status: AC
  Administered 2014-04-10: 550 mg via INTRAVENOUS
  Filled 2014-04-10: qty 11

## 2014-04-10 NOTE — Telephone Encounter (Signed)
Pt confirmed labs/ov per 01/20 POF, gave pt AVS.... KJ, sent msg to add chemo and also sch genetics

## 2014-04-10 NOTE — Progress Notes (Signed)
[place] OFFICE PROGRESS NOTE   Diagnosis: Gastric cancer   INTERVAL HISTORY:   Mr. Exley returns as scheduled. He completed cycle 1 FOLFOX on 03/27/2014. The nausea and early satiety he was experiencing prior to treatment has improved. He had nausea/vomiting for 3 days following the chemotherapy which he attributes to the chemotherapy. Compazine was effective. He also noted difficulty sleeping. No mouth sores. No diarrhea. Cold sensitivity lasted 3 days. No persistent neuropathy symptoms.  Objective:  Vital signs in last 24 hours:  Blood pressure 116/72, pulse 78, temperature 98.4 F (36.9 C), temperature source Oral, resp. rate 18, height 5' 3"  (1.6 m), weight 156 lb (70.761 kg), SpO2 100 %.    HEENT: No thrush or ulcers. Resp: Lungs clear bilaterally. Cardio: Regular rate and rhythm. GI: Abdomen soft and nontender. No hepatomegaly. Vascular: No leg edema. Port-A-Cath without erythema.  Lab Results:  Lab Results  Component Value Date   WBC 5.1 04/10/2014   HGB 10.0* 04/10/2014   HCT 33.7* 04/10/2014   MCV 71.2* 04/10/2014   PLT 260 04/10/2014   NEUTROABS 2.8 04/10/2014    Imaging:  No results found.  Medications: I have reviewed the patient's current medications.  Assessment/Plan: 1.Metastatic gastric cancer-status post biopsy of a gastric mass on 07/20/2012 with the pathology confirming adenocarcinoma, HER-2 non-amplified.  - CT 07/18/2012 consistent with a primary gastric mass and metastatic omental/peritoneal nodules  -biopsy of a peritoneal nodule 07/31/2012 consistent with metastatic adenocarcinoma  -Cycle 1 of FOLFOX on 08/02/2012  -Restaging CT 10/23/2012 consistent with a partial response-decrease in the primary gastric tumor and omental/peritoneal implants  -cycle 10 of FOLFOX completed 01/11/2013  -normal CEA 01/11/2013  -Restaging CT 01/23/2013 with improvement in the peritoneal metastases and an increased size of an  indeterminate splenic lesion  -Initiation of maintenance Xeloda therapy on a 7 day on/7 day off schedule 01/29/2013.  -Xeloda dose reduced to 1000 mg twice daily 7 days on/7 days off beginning 02/26/2013 due to nausea and diarrhea.  -Xeloda dose reduced to 500 mg twice daily, 7 days/14 days off 03/20/2013.  -Xeloda discontinued 04/25/2013 due to progressive hand foot syndrome.  -CEA elevated at 9.1 on 04/25/2013.  -CEA stable at 9.5 on 05/09/2013.  -Initiation of infusional 5-FU as per the FOLFOX regimen 05/09/2013.  -CEA 7.4 on 05/24/2013.  -CEA 7.9 on 06/21/2013.  -CEA 9.3 on 08/22/2013 .  -CEA 11.1 on 10/01/2013.  -Restaging CT 10/01/2013 with persistent asymmetric gastric wall thickening in the greater curvature of the mid stomach. Dominant index gastrocolic lymph node decreased in size. Other smaller perigastric lymph nodes stable. No new or progressive findings in or around the stomach. Low-density subcapsular splenic lesion resolved.  -Continuation of infusional 5-fluorouracil, switched to a 4 week schedule beginning 01/02/2014 -CT 03/26/2014 with enlargement of the gastric mass and a possible new liver lesion -FOLFOX chemotherapy resumed 03/27/2014 3. Multiple colon polyps documented on a colonoscopy 06/22/2012-tubular adenomas, tubulovillous adenomas, and hyperplastic polyps  4. Family history of colon cancer (mother)  66. Pain secondary to the primary gastric mass or carcinomatosis -resolved  6. 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 Enrique Sack, multiple extractions were recommended  7. Anorexia, nausea, and early satiety-improved  8. 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 Nausea following chemotherapy-improved with the addition of prophylactic Decadron  10. Skin rash-most likely secondary to Decadron, improved  11. History of neutropenia secondary to chemotherapy  12. 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 Nausea and diarrhea while treated with Xeloda. Improved.  15. Hand-foot syndrome 03/07/2013 manifesting with erythema, dry desquamation and pain on the palms and erythema and pain on the soles. Progressive hand pain 04/25/2013. Xeloda discontinued.  16. Anemia-microcytic; chronic, stable. Ferritin low at 6 11/21/2013. Oral iron initiated.    Disposition: Mr. Piscopo appears stable. He has completed 1 cycle of FOLFOX. The nausea and early satiety are better. Plan to proceed with cycle 2 today as scheduled.   He had significant delayed nausea following cycle 1 FOLFOX. We will add Emend to the premedication regimen. He was given a prescription for Ativan 0.5 mg every 8 hours as needed for nausea. I also instructed him he can take a dose at bedtime for difficulty sleeping.  He will return for a follow-up visit and cycle 3 FOLFOX in 2 weeks. We will obtain a repeat CEA at that time. He will contact the office in the interim with any problems.  Plan reviewed with Dr. Benay Spice.  Ned Card ANP/GNP-BC   04/10/2014  10:59 AM

## 2014-04-10 NOTE — Telephone Encounter (Signed)
Per staff message and POF I have scheduled appts. Advised scheduler of appts. JMW  

## 2014-04-12 ENCOUNTER — Ambulatory Visit (HOSPITAL_BASED_OUTPATIENT_CLINIC_OR_DEPARTMENT_OTHER): Payer: 59

## 2014-04-12 DIAGNOSIS — C169 Malignant neoplasm of stomach, unspecified: Secondary | ICD-10-CM

## 2014-04-12 DIAGNOSIS — Z452 Encounter for adjustment and management of vascular access device: Secondary | ICD-10-CM

## 2014-04-12 DIAGNOSIS — C786 Secondary malignant neoplasm of retroperitoneum and peritoneum: Secondary | ICD-10-CM

## 2014-04-12 MED ORDER — SODIUM CHLORIDE 0.9 % IJ SOLN
10.0000 mL | INTRAMUSCULAR | Status: DC | PRN
  Administered 2014-04-12: 10 mL
  Filled 2014-04-12: qty 10

## 2014-04-12 MED ORDER — HEPARIN SOD (PORK) LOCK FLUSH 100 UNIT/ML IV SOLN
500.0000 [IU] | Freq: Once | INTRAVENOUS | Status: AC | PRN
  Administered 2014-04-12: 500 [IU]
  Filled 2014-04-12: qty 5

## 2014-04-12 NOTE — Patient Instructions (Signed)
[facility] Discharge Instructions for Patients Receiving Chemotherapy  Today you received the following chemotherapy agents 5FU  To help prevent nausea and vomiting after your treatment, we encourage you to take your nausea medication as prescribed   If you develop nausea and vomiting that is not controlled by your nausea medication, call the clinic.   BELOW ARE SYMPTOMS THAT SHOULD BE REPORTED IMMEDIATELY:  *FEVER GREATER THAN 100.5 F  *CHILLS WITH OR WITHOUT FEVER  NAUSEA AND VOMITING THAT IS NOT CONTROLLED WITH YOUR NAUSEA MEDICATION  *UNUSUAL SHORTNESS OF BREATH  *UNUSUAL BRUISING OR BLEEDING  TENDERNESS IN MOUTH AND THROAT WITH OR WITHOUT PRESENCE OF ULCERS  *URINARY PROBLEMS  *BOWEL PROBLEMS  UNUSUAL RASH Items with * indicate a potential emergency and should be followed up as soon as possible.  Feel free to call the clinic you have any questions or concerns. The clinic phone number is [phone].    

## 2014-04-21 ENCOUNTER — Other Ambulatory Visit: Payer: Self-pay | Admitting: Oncology

## 2014-04-24 ENCOUNTER — Other Ambulatory Visit (HOSPITAL_BASED_OUTPATIENT_CLINIC_OR_DEPARTMENT_OTHER): Payer: 59

## 2014-04-24 ENCOUNTER — Ambulatory Visit (HOSPITAL_BASED_OUTPATIENT_CLINIC_OR_DEPARTMENT_OTHER): Payer: 59 | Admitting: Nurse Practitioner

## 2014-04-24 ENCOUNTER — Ambulatory Visit (HOSPITAL_BASED_OUTPATIENT_CLINIC_OR_DEPARTMENT_OTHER): Payer: 59

## 2014-04-24 ENCOUNTER — Telehealth: Payer: Self-pay | Admitting: Oncology

## 2014-04-24 ENCOUNTER — Encounter: Payer: Self-pay | Admitting: Nurse Practitioner

## 2014-04-24 ENCOUNTER — Ambulatory Visit: Payer: 59 | Admitting: Nurse Practitioner

## 2014-04-24 VITALS — BP 125/76 | HR 76 | Temp 98.1°F | Resp 20 | Ht 63.0 in | Wt 157.6 lb

## 2014-04-24 DIAGNOSIS — Z800 Family history of malignant neoplasm of digestive organs: Secondary | ICD-10-CM

## 2014-04-24 DIAGNOSIS — Z5111 Encounter for antineoplastic chemotherapy: Secondary | ICD-10-CM

## 2014-04-24 DIAGNOSIS — C786 Secondary malignant neoplasm of retroperitoneum and peritoneum: Secondary | ICD-10-CM

## 2014-04-24 DIAGNOSIS — G620 Drug-induced polyneuropathy: Secondary | ICD-10-CM

## 2014-04-24 DIAGNOSIS — R630 Anorexia: Secondary | ICD-10-CM

## 2014-04-24 DIAGNOSIS — C169 Malignant neoplasm of stomach, unspecified: Secondary | ICD-10-CM

## 2014-04-24 DIAGNOSIS — T7840XA Allergy, unspecified, initial encounter: Secondary | ICD-10-CM

## 2014-04-24 DIAGNOSIS — K5900 Constipation, unspecified: Secondary | ICD-10-CM

## 2014-04-24 DIAGNOSIS — D649 Anemia, unspecified: Secondary | ICD-10-CM

## 2014-04-24 LAB — COMPREHENSIVE METABOLIC PANEL (CC13)
ALT: 9 U/L (ref 0–55)
ANION GAP: 7 meq/L (ref 3–11)
AST: 11 U/L (ref 5–34)
Albumin: 3.6 g/dL (ref 3.5–5.0)
Alkaline Phosphatase: 81 U/L (ref 40–150)
BUN: 8.2 mg/dL (ref 7.0–26.0)
CALCIUM: 9 mg/dL (ref 8.4–10.4)
CO2: 28 mEq/L (ref 22–29)
CREATININE: 0.9 mg/dL (ref 0.7–1.3)
Chloride: 107 mEq/L (ref 98–109)
EGFR: >90 mL/min/{1.73_m2} (ref >90)
Glucose: 118 mg/dl (ref 70–140)
POTASSIUM: 4.2 meq/L (ref 3.5–5.1)
Sodium: 142 mEq/L (ref 136–145)
TOTAL PROTEIN: 6.7 g/dL (ref 6.4–8.3)
Total Bilirubin: 0.38 mg/dL (ref 0.20–1.20)

## 2014-04-24 LAB — CEA: CEA: 15.9 ng/mL — AB (ref 0.0–5.0)

## 2014-04-24 LAB — CBC WITH DIFFERENTIAL/PLATELET
BASO%: 1.7 % (ref 0.0–2.0)
BASOS ABS: 0.1 10*3/uL (ref 0.0–0.1)
EOS ABS: 0.1 10*3/uL (ref 0.0–0.5)
EOS%: 2.3 % (ref 0.0–7.0)
HEMATOCRIT: 34.1 % — AB (ref 38.4–49.9)
HGB: 10.1 g/dL — ABNORMAL LOW (ref 13.0–17.1)
LYMPH#: 1.3 10*3/uL (ref 0.9–3.3)
LYMPH%: 29.4 % (ref 14.0–49.0)
MCH: 20.8 pg — AB (ref 27.2–33.4)
MCHC: 29.5 g/dL — AB (ref 32.0–36.0)
MCV: 70.5 fL — ABNORMAL LOW (ref 79.3–98.0)
MONO#: 0.5 10*3/uL (ref 0.1–0.9)
MONO%: 12.4 % (ref 0.0–14.0)
NEUT#: 2.3 10*3/uL (ref 1.5–6.5)
NEUT%: 54.2 % (ref 39.0–75.0)
Platelets: 198 10*3/uL (ref 140–400)
RBC: 4.84 10*6/uL (ref 4.20–5.82)
RDW: 20 % — AB (ref 11.0–14.6)
WBC: 4.3 10*3/uL (ref 4.0–10.3)

## 2014-04-24 MED ORDER — LEUCOVORIN CALCIUM INJECTION 350 MG
300.0000 mg/m2 | Freq: Once | INTRAVENOUS | Status: AC
  Administered 2014-04-24: 540 mg via INTRAVENOUS
  Filled 2014-04-24: qty 27

## 2014-04-24 MED ORDER — SODIUM CHLORIDE 0.9 % IV SOLN
150.0000 mg | Freq: Once | INTRAVENOUS | Status: AC
  Administered 2014-04-24: 150 mg via INTRAVENOUS
  Filled 2014-04-24: qty 5

## 2014-04-24 MED ORDER — DEXTROSE 5 % IV SOLN
85.0000 mg/m2 | Freq: Once | INTRAVENOUS | Status: AC
  Administered 2014-04-24: 155 mg via INTRAVENOUS
  Filled 2014-04-24: qty 31

## 2014-04-24 MED ORDER — SODIUM CHLORIDE 0.9 % IV SOLN
Freq: Once | INTRAVENOUS | Status: AC
  Administered 2014-04-24: 10:00:00 via INTRAVENOUS

## 2014-04-24 MED ORDER — FLUOROURACIL CHEMO INJECTION 5 GM/100ML
1920.0000 mg/m2 | INTRAVENOUS | Status: DC
  Administered 2014-04-24: 3450 mg via INTRAVENOUS
  Filled 2014-04-24: qty 69

## 2014-04-24 MED ORDER — LIDOCAINE-PRILOCAINE 2.5-2.5 % EX CREA: 1.0000 "application " | TOPICAL_CREAM | CUTANEOUS | Status: DC | PRN

## 2014-04-24 MED ORDER — DEXAMETHASONE SODIUM PHOSPHATE 20 MG/5ML IJ SOLN
INTRAMUSCULAR | Status: AC
  Filled 2014-04-24: qty 5

## 2014-04-24 MED ORDER — DEXAMETHASONE SODIUM PHOSPHATE 10 MG/ML IJ SOLN
10.0000 mg | Freq: Once | INTRAMUSCULAR | Status: AC
  Administered 2014-04-24: 10 mg via INTRAVENOUS

## 2014-04-24 MED ORDER — FLUOROURACIL CHEMO INJECTION 2.5 GM/50ML
300.0000 mg/m2 | Freq: Once | INTRAVENOUS | Status: AC
  Administered 2014-04-24: 550 mg via INTRAVENOUS
  Filled 2014-04-24: qty 11

## 2014-04-24 MED ORDER — MAGIC MOUTHWASH
5.0000 mL | ORAL | Status: DC | PRN
Start: 2014-04-24 — End: 2015-01-20

## 2014-04-24 MED ORDER — METHYLPREDNISOLONE SODIUM SUCC 125 MG IJ SOLR
125.0000 mg | Freq: Once | INTRAMUSCULAR | Status: AC
  Administered 2014-04-24: 125 mg via INTRAVENOUS

## 2014-04-24 MED ORDER — PALONOSETRON HCL INJECTION 0.25 MG/5ML
0.2500 mg | Freq: Once | INTRAVENOUS | Status: AC
  Administered 2014-04-24: 0.25 mg via INTRAVENOUS

## 2014-04-24 MED ORDER — PALONOSETRON HCL INJECTION 0.25 MG/5ML
INTRAVENOUS | Status: AC
  Filled 2014-04-24: qty 5

## 2014-04-24 MED ORDER — DIPHENHYDRAMINE HCL 50 MG/ML IJ SOLN
50.0000 mg | Freq: Once | INTRAMUSCULAR | Status: AC
  Administered 2014-04-24: 50 mg via INTRAVENOUS

## 2014-04-24 MED ORDER — DEXTROSE 5 % IV SOLN
Freq: Once | INTRAVENOUS | Status: AC
  Administered 2014-04-24: 11:00:00 via INTRAVENOUS

## 2014-04-24 MED ORDER — FAMOTIDINE IN NACL 20-0.9 MG/50ML-% IV SOLN
20.0000 mg | Freq: Two times a day (BID) | INTRAVENOUS | Status: DC
  Administered 2014-04-24: 20 mg via INTRAVENOUS

## 2014-04-24 NOTE — Progress Notes (Signed)
SYMPTOM MANAGEMENT CLINIC   HPI: Joseph Salinas 54 y.o. male diagnosed with gastric cancer.  Currently undergoing FOLFOX chemotherapy regimen.  Patient presents to the [facility] today to receive cycle 3 of his FOLFOX chemotherapy regimen.  He experienced a mild rash and itching during the oxaliplatin/leucovorin portion of his chemotherapy.  The infusion was held; and symptoms managed per hypersensitivity protocol.  All of patient's symptoms did eventually resolve; and patient was able to complete the chemotherapy infusion as previously directed.  Vital signs remained stable throughout.  Also, patient did have an interpreter at bedside for this visit.   HPI  CURRENT THERAPY: Upcoming Treatment Dates - COLORECTAL 5FU / Leucovorin Modified [place] q14d Days with orders from any treatment category:  04/26/2014      [facility] COMMUNICATION      sodium chloride 0.9 % injection 10 mL      heparin lock flush 100 unit/mL      heparin lock flush 100 unit/mL      alteplase (CATHFLO ACTIVASE) injection 2 mg      sodium chloride 0.9 % injection 3 mL      Cold Pack 1 packet 05/08/2014      SCHEDULING COMMUNICATION      fosaprepitant (EMEND) 150 mg in sodium chloride 0.9 % 145 mL IVPB      palonosetron (ALOXI) injection 0.25 mg      dexamethasone (DECADRON) injection 10 mg      oxaliplatin (ELOXATIN) 155 mg in dextrose 5 % 500 mL chemo infusion      leucovorin 540 mg in dextrose 5 % 250 mL infusion      fluorouracil (ADRUCIL) chemo injection 550 mg      fluorouracil (ADRUCIL) 3,450 mg in sodium chloride 0.9 % 150 mL chemo infusion      sodium chloride 0.9 % injection 10 mL      heparin lock flush 100 unit/mL      heparin lock flush 100 unit/mL      alteplase (CATHFLO ACTIVASE) injection 2 mg      sodium chloride 0.9 % injection 3 mL      Cold Pack 1 packet      0.9 %  sodium chloride infusion      TREATMENT CONDITIONS 05/10/2014      SCHEDULING COMMUNICATION      sodium chloride  0.9 % injection 10 mL      heparin lock flush 100 unit/mL      heparin lock flush 100 unit/mL      alteplase (CATHFLO ACTIVASE) injection 2 mg      sodium chloride 0.9 % injection 3 mL      Cold Pack 1 packet    ROS  Past Medical History  Diagnosis Date  . Colon polyp   . Gastric cancer 08/09/2012    Past Surgical History  Procedure Laterality Date  . No prior surgery    . Colonoscopy    . Insertion of port a cath Right 08/08/2012  . Esophagogastroduodenoscopy endoscopy    . Biospy  07/31/12    gastric   . Multiple extractions with alveoloplasty N/A 11/10/2012    Procedure: extraction of tooth #'s 1, 2,10,14,15,16,17,18, 23, 24, 25, 26, 32 with alveoloplasty and gross debridement of remaining teeth;  Surgeon: Lenn Cal, DDS;  Location: [facility];  Service: Oral Surgery;  Laterality: N/A;    has Hx of adenomatous colonic polyps; Gastric cancer; Chronic periodontitis; and Hypersensitivity reaction on his problem list.  is allergic to penicillins.    Medication List       This list is accurate as of: 04/24/14  3:51 PM.  Always use your most recent med list.               docusate sodium 50 MG capsule  Commonly known as:  COLACE  Take 1 capsule (50 mg total) by mouth 2 (two) times daily.     lidocaine-prilocaine cream  Commonly known as:  EMLA  Apply 1 application topically as needed. Apply 1 tsp to PAC site 1-2 hours prior to stick and cover with plastic wrap to numb site     LORazepam 0.5 MG tablet  Commonly known as:  ATIVAN  Take 1 tablet (0.5 mg total) by mouth every 8 (eight) hours as needed for anxiety. Take for nausea every 8 hours as needed; may take dose at bedtime as needed for sleep.     magic mouthwash Soln  Take 5 mLs by mouth every 4 (four) hours as needed for mouth pain (5cc every 4 hrs as needed, swish and spit).     prochlorperazine 10 MG tablet  Commonly known as:  COMPAZINE  Take 1 tablet (10 mg total) by mouth every 6 (six) hours as needed  (nausea).         PHYSICAL EXAMINATION  Vitals: 139/82, HR 76, temp 98.1, 100%  Physical Exam  Constitutional: He is oriented to person, place, and time and well-developed, well-nourished, and in no distress.  HENT:  Head: Normocephalic and atraumatic.  Mouth/Throat: Oropharynx is clear and moist.  Eyes: Conjunctivae and EOM are normal. Pupils are equal, round, and reactive to light. Right eye exhibits no discharge. Left eye exhibits no discharge. No scleral icterus.  Neck: Normal range of motion. Neck supple. No JVD present. No tracheal deviation present. No thyromegaly present.  Cardiovascular: Normal rate, regular rhythm, normal heart sounds and intact distal pulses.   Pulmonary/Chest: Effort normal and breath sounds normal. No stridor. No respiratory distress. He has no wheezes. He has no rales. He exhibits no tenderness.  Abdominal: Soft. Bowel sounds are normal. He exhibits no distension and no mass. There is no tenderness. There is no rebound and no guarding.  Musculoskeletal: Normal range of motion. He exhibits no edema or tenderness.  Lymphadenopathy:    He has no cervical adenopathy.  Neurological: He is alert and oriented to person, place, and time. Gait normal.  Skin: Skin is warm and dry. Rash noted. No erythema. No pallor.  Patient has a fine, scattered rash to his chest and his back.  Also has some trace rash to his bilateral arms as well.  Patient was noted to be scratching at his rash.  Psychiatric: Affect normal.  Nursing note and vitals reviewed.   LABORATORY DATA:. Appointment on 04/24/2014  Component Date Value Ref Range Status  . WBC 04/24/2014 4.3  4.0 - 10.3 10e3/uL Final  . NEUT# 04/24/2014 2.3  1.5 - 6.5 10e3/uL Final  . HGB 04/24/2014 10.1* 13.0 - 17.1 g/dL Final  . HCT 04/24/2014 34.1* 38.4 - 49.9 % Final  . Platelets 04/24/2014 198  140 - 400 10e3/uL Final  . MCV 04/24/2014 70.5* 79.3 - 98.0 fL Final  . MCH 04/24/2014 20.8* 27.2 - 33.4 pg Final  .  MCHC 04/24/2014 29.5* 32.0 - 36.0 g/dL Final  . RBC 04/24/2014 4.84  4.20 - 5.82 10e6/uL Final  . RDW 04/24/2014 20.0* 11.0 - 14.6 % Final  . lymph# 04/24/2014 1.3  0.9 - 3.3 10e3/uL Final  . MONO# 04/24/2014 0.5  0.1 - 0.9 10e3/uL Final  . Eosinophils Absolute 04/24/2014 0.1  0.0 - 0.5 10e3/uL Final  . Basophils Absolute 04/24/2014 0.1  0.0 - 0.1 10e3/uL Final  . NEUT% 04/24/2014 54.2  39.0 - 75.0 % Final  . LYMPH% 04/24/2014 29.4  14.0 - 49.0 % Final  . MONO% 04/24/2014 12.4  0.0 - 14.0 % Final  . EOS% 04/24/2014 2.3  0.0 - 7.0 % Final  . BASO% 04/24/2014 1.7  0.0 - 2.0 % Final  . Sodium 04/24/2014 142  136 - 145 mEq/L Final  . Potassium 04/24/2014 4.2  3.5 - 5.1 mEq/L Final  . Chloride 04/24/2014 107  98 - 109 mEq/L Final  . CO2 04/24/2014 28  22 - 29 mEq/L Final  . Glucose 04/24/2014 118  70 - 140 mg/dl Final  . BUN 04/24/2014 8.2  7.0 - 26.0 mg/dL Final  . Creatinine 04/24/2014 0.9  0.7 - 1.3 mg/dL Final  . Total Bilirubin 04/24/2014 0.38  0.20 - 1.20 mg/dL Final  . Alkaline Phosphatase 04/24/2014 81  40 - 150 U/L Final  . AST 04/24/2014 11  5 - 34 U/L Final  . ALT 04/24/2014 9  0 - 55 U/L Final  . Total Protein 04/24/2014 6.7  6.4 - 8.3 g/dL Final  . Albumin 04/24/2014 3.6  3.5 - 5.0 g/dL Final  . Calcium 04/24/2014 9.0  8.4 - 10.4 mg/dL Final  . Anion Gap 04/24/2014 7  3 - 11 mEq/L Final  . EGFR 04/24/2014 >90  >90 ml/min/1.73 m2 Final   eGFR is calculated using the CKD-EPI Creatinine Equation (2009)  . CEA 04/24/2014 15.9* 0.0 - 5.0 ng/mL Final     RADIOGRAPHIC STUDIES: No results found.  ASSESSMENT/PLAN:    Hypersensitivity reaction Patient was in the midst of receiving the oxaliplatin/leucovorin portion of his FOLFOX chemotherapy; and developed onset of generalized rash and itching.  Infusion was held. No actual hives noted on exam.  Patient denied any chest pain, chest pressure, shortness of breath, or other issues.  Patient managing airway and secretions well.  Vital  signs remained stable throughout.  Patient was given Benadryl 25 mg IV, Pepcid 20 mg IV, and site Medrol 125 mg IV per hypersensitivity protocol.  All symptoms did essentially resolve; and patient was able to complete his chemotherapy as previously directed.   Gastric cancer Patient presented to the [facility] today to receive cycle 3 of his FOLFOX chemotherapy.  He did develop a rash and some pruritus during the oxaliplatin/leucovorin portion of his chemotherapy today.  Mild reaction was managed per hypersensitivity protocol; and patient was able to complete his chemotherapy today.  Patient has plans to return to the [facility] for cycle 4 of the same regimen on 05/08/2014.   Patient stated understanding of all instructions; and was in agreement with this plan of care. The patient knows to call the clinic with any problems, questions or concerns.   Review/collaboration with Dr. Benay Spice regarding all aspects of patient's visit today.   Total time spent with patient was 25 minutes;  with greater than 75 percent of that time spent in face to face counseling regarding patient's symptoms,  and coordination of care and follow up.  Disclaimer: This note was dictated with voice recognition software. Similar sounding words can inadvertently be transcribed and may not be corrected upon review.   Drue Second, NP 04/24/2014

## 2014-04-24 NOTE — Telephone Encounter (Signed)
gv adn printed appt sched adn avs for pt for Feb adn March....sed added tx.

## 2014-04-24 NOTE — Progress Notes (Addendum)
1158-Pt beginning to complain of itching to arms bilaterally and to back.  Red raised rash noted.  Oxaliplatin and Leucovorin stopped.  VS stable.  Michel Harrow NP notified and to infusion room to assess patient.  Order received to give Benadryl, Pepcid and Solumedrol as ordered.  Wife and interpreter at bedside.    1240-Pt rash and itching has subsided.  Michel Harrow NP to infusion room and order given to restart Oxaliplatin/leucovorin.  1247-Oxaliplatin and Leucovorin restarted.

## 2014-04-24 NOTE — Assessment & Plan Note (Signed)
Patient was in the midst of receiving the oxaliplatin/leucovorin portion of his FOLFOX chemotherapy; and developed onset of generalized rash and itching.  Infusion was held. No actual hives noted on exam.  Patient denied any chest pain, chest pressure, shortness of breath, or other issues.  Patient managing airway and secretions well.  Vital signs remained stable throughout.  Patient was given Benadryl 25 mg IV, Pepcid 20 mg IV, and site Medrol 125 mg IV per hypersensitivity protocol.  All symptoms did essentially resolve; and patient was able to complete his chemotherapy as previously directed.

## 2014-04-24 NOTE — Progress Notes (Signed)
[facility] OFFICE PROGRESS NOTE   Diagnosis:  Gastric cancer  INTERVAL HISTORY:   Joseph Salinas returns as scheduled. He completed cycle 2 FOLFOX 04/10/2014. He denies nausea/vomiting. He has noted a single mouth sore. No diarrhea. The cold sensitivity lasted 4 days. He has mild numbness/tingling in the fingertips. This does not interfere with activity. He reports a good appetite. The early satiety has resolved. He denies pain.  Objective:  Vital signs in last 24 hours:  Blood pressure 125/76, pulse 76, temperature 98.1 F (36.7 C), temperature source Oral, resp. rate 20, height _0  (1.6 m), weight 157 lb 9.6 oz (71.487 kg), SpO2 100 %.    HEENT: No thrush. Tiny ulceration right anterior buccal mucosa. Resp: Lungs clear bilaterally. Cardio: Regular rate and rhythm. GI: Abdomen soft and nontender. No hepatomegaly. Vascular: No leg edema. Neuro: Vibratory sense mildly decreased over the fingertips per tuning fork exam.  Port-A-Cath without erythema.   Lab Results:  Lab Results  Component Value Date   WBC 4.3 04/24/2014   HGB 10.1* 04/24/2014   HCT 34.1* 04/24/2014   MCV 70.5* 04/24/2014   PLT 198 04/24/2014   NEUTROABS 2.3 04/24/2014    Imaging:  No results found.  Medications: I have reviewed the patient's current medications.  Assessment/Plan: 1.Metastatic gastric cancer-status post biopsy of a gastric mass on 07/20/2012 with the pathology confirming adenocarcinoma, HER-2 non-amplified.  - CT 07/18/2012 consistent with a primary gastric mass and metastatic omental/peritoneal nodules  -biopsy of a peritoneal nodule 07/31/2012 consistent with metastatic adenocarcinoma  -Cycle 1 of FOLFOX on 08/02/2012  -Restaging CT 10/23/2012 consistent with a partial response-decrease in the primary gastric tumor and omental/peritoneal implants  -cycle 10 of FOLFOX completed 01/11/2013  -normal CEA 01/11/2013  -Restaging CT 01/23/2013 with improvement in  the peritoneal metastases and an increased size of an indeterminate splenic lesion  -Initiation of maintenance Xeloda therapy on a 7 day on/7 day off schedule 01/29/2013.  -Xeloda dose reduced to 1000 mg twice daily 7 days on/7 days off beginning 02/26/2013 due to nausea and diarrhea.  -Xeloda dose reduced to 500 mg twice daily, 7 days/14 days off 03/20/2013.  -Xeloda discontinued 04/25/2013 due to progressive hand foot syndrome.  -CEA elevated at 9.1 on 04/25/2013.  -CEA stable at 9.5 on 05/09/2013.  -Initiation of infusional 5-FU as per the FOLFOX regimen 05/09/2013.  -CEA 7.4 on 05/24/2013.  -CEA 7.9 on 06/21/2013.  -CEA 9.3 on 08/22/2013 .  -CEA 11.1 on 10/01/2013.  -Restaging CT 10/01/2013 with persistent asymmetric gastric wall thickening in the greater curvature of the mid stomach. Dominant index gastrocolic lymph node decreased in size. Other smaller perigastric lymph nodes stable. No new or progressive findings in or around the stomach. Low-density subcapsular splenic lesion resolved.  -Continuation of infusional 5-fluorouracil, switched to a 4 week schedule beginning 01/02/2014 -CT 03/26/2014 with enlargement of the gastric mass and a possible new liver lesion -FOLFOX chemotherapy resumed 03/27/2014 3. Multiple colon polyps documented on a colonoscopy 06/22/2012-tubular adenomas, tubulovillous adenomas, and hyperplastic polyps  4. Family history of colon cancer (mother)  52. Pain secondary to the primary gastric mass or carcinomatosis -resolved  6. 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 Enrique Salinas, multiple extractions were recommended  7. Anorexia, nausea, and early satiety-improved  8. 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 Nausea following chemotherapy-improved with the addition of prophylactic Decadron  10. Skin rash-most likely secondary to Decadron, improved  11. History of neutropenia  secondary to  chemotherapy  12. 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 Nausea and diarrhea while treated with Xeloda. Improved.  15. Hand-foot syndrome 03/07/2013 manifesting with erythema, dry desquamation and pain on the palms and erythema and pain on the soles. Progressive hand pain 04/25/2013. Xeloda discontinued.  16. Anemia-microcytic; chronic, stable. Ferritin low at 6 11/21/2013. Oral iron initiated. 17. Delayed nausea following cycle 1 FOLFOX. Aloxi added beginning with cycle 2 04/10/2014. He had no nausea following cycle 2.     Disposition: Joseph Salinas appears stable. He has completed 2 cycles of FOLFOX. The nausea and early satiety he was experiencing prior to beginning FOLFOX have resolved. Plan to proceed with cycle 3 today as scheduled. He will return for a follow-up visit and cycle 4 in 2 weeks.   We sent a prescription to his pharmacy for Magic mouthwash. He will contact the office with increased mouth sores.  Plan reviewed with Dr. Benay Salinas.    Joseph Salinas ANP/GNP-BC   04/24/2014  9:28 AM

## 2014-04-24 NOTE — Assessment & Plan Note (Signed)
Patient presented to the [facility] today to receive cycle 3 of his FOLFOX chemotherapy.  He did develop a rash and some pruritus during the oxaliplatin/leucovorin portion of his chemotherapy today.  Mild reaction was managed per hypersensitivity protocol; and patient was able to complete his chemotherapy today.  Patient has plans to return to the [facility] for cycle 4 of the same regimen on 05/08/2014.

## 2014-04-24 NOTE — Patient Instructions (Signed)
[facility] Discharge Instructions for Patients Receiving Chemotherapy  Today you received the following chemotherapy agents:  Oxaliplatin, Leucovorin and 5FU  To help prevent nausea and vomiting after your treatment, we encourage you to take your nausea medication as ordered per MD.   If you develop nausea and vomiting that is not controlled by your nausea medication, call the clinic.   BELOW ARE SYMPTOMS THAT SHOULD BE REPORTED IMMEDIATELY:  *FEVER GREATER THAN 100.5 F  *CHILLS WITH OR WITHOUT FEVER  NAUSEA AND VOMITING THAT IS NOT CONTROLLED WITH YOUR NAUSEA MEDICATION  *UNUSUAL SHORTNESS OF BREATH  *UNUSUAL BRUISING OR BLEEDING  TENDERNESS IN MOUTH AND THROAT WITH OR WITHOUT PRESENCE OF ULCERS  *URINARY PROBLEMS  *BOWEL PROBLEMS  UNUSUAL RASH Items with * indicate a potential emergency and should be followed up as soon as possible.  Feel free to call the clinic you have any questions or concerns. The clinic phone number is [phone].

## 2014-04-26 ENCOUNTER — Ambulatory Visit (HOSPITAL_BASED_OUTPATIENT_CLINIC_OR_DEPARTMENT_OTHER): Payer: 59

## 2014-04-26 DIAGNOSIS — C786 Secondary malignant neoplasm of retroperitoneum and peritoneum: Secondary | ICD-10-CM

## 2014-04-26 DIAGNOSIS — C169 Malignant neoplasm of stomach, unspecified: Secondary | ICD-10-CM

## 2014-04-26 MED ORDER — HEPARIN SOD (PORK) LOCK FLUSH 100 UNIT/ML IV SOLN
500.0000 [IU] | Freq: Once | INTRAVENOUS | Status: AC | PRN
  Administered 2014-04-26: 500 [IU]
  Filled 2014-04-26: qty 5

## 2014-04-26 MED ORDER — SODIUM CHLORIDE 0.9 % IJ SOLN
10.0000 mL | INTRAMUSCULAR | Status: DC | PRN
  Administered 2014-04-26: 10 mL
  Filled 2014-04-26: qty 10

## 2014-05-05 ENCOUNTER — Other Ambulatory Visit: Payer: Self-pay | Admitting: Oncology

## 2014-05-07 ENCOUNTER — Other Ambulatory Visit: Payer: Self-pay | Admitting: *Deleted

## 2014-05-07 DIAGNOSIS — C169 Malignant neoplasm of stomach, unspecified: Secondary | ICD-10-CM

## 2014-05-08 ENCOUNTER — Ambulatory Visit: Payer: 59 | Admitting: Genetic Counselor

## 2014-05-08 ENCOUNTER — Ambulatory Visit (HOSPITAL_BASED_OUTPATIENT_CLINIC_OR_DEPARTMENT_OTHER): Payer: Self-pay | Admitting: Oncology

## 2014-05-08 ENCOUNTER — Ambulatory Visit (HOSPITAL_BASED_OUTPATIENT_CLINIC_OR_DEPARTMENT_OTHER): Payer: 59

## 2014-05-08 ENCOUNTER — Telehealth: Payer: Self-pay | Admitting: Oncology

## 2014-05-08 ENCOUNTER — Other Ambulatory Visit (HOSPITAL_BASED_OUTPATIENT_CLINIC_OR_DEPARTMENT_OTHER): Payer: 59

## 2014-05-08 ENCOUNTER — Telehealth: Payer: Self-pay | Admitting: *Deleted

## 2014-05-08 ENCOUNTER — Encounter: Payer: Self-pay | Admitting: Genetic Counselor

## 2014-05-08 VITALS — BP 122/82 | HR 72 | Temp 98.4°F | Resp 18 | Ht 63.0 in | Wt 157.7 lb

## 2014-05-08 DIAGNOSIS — C169 Malignant neoplasm of stomach, unspecified: Secondary | ICD-10-CM

## 2014-05-08 DIAGNOSIS — C786 Secondary malignant neoplasm of retroperitoneum and peritoneum: Secondary | ICD-10-CM

## 2014-05-08 DIAGNOSIS — D509 Iron deficiency anemia, unspecified: Secondary | ICD-10-CM

## 2014-05-08 DIAGNOSIS — Z5111 Encounter for antineoplastic chemotherapy: Secondary | ICD-10-CM

## 2014-05-08 DIAGNOSIS — Z800 Family history of malignant neoplasm of digestive organs: Secondary | ICD-10-CM | POA: Insufficient documentation

## 2014-05-08 DIAGNOSIS — Z86010 Personal history of colonic polyps: Secondary | ICD-10-CM

## 2014-05-08 LAB — COMPREHENSIVE METABOLIC PANEL (CC13)
ALBUMIN: 3.7 g/dL (ref 3.5–5.0)
ALT: 11 U/L (ref 0–55)
AST: 14 U/L (ref 5–34)
Alkaline Phosphatase: 81 U/L (ref 40–150)
Anion Gap: 9 mEq/L (ref 3–11)
BUN: 10.1 mg/dL (ref 7.0–26.0)
CO2: 26 mEq/L (ref 22–29)
CREATININE: 0.8 mg/dL (ref 0.7–1.3)
Calcium: 8.9 mg/dL (ref 8.4–10.4)
Chloride: 107 mEq/L (ref 98–109)
EGFR: >90 mL/min/{1.73_m2} (ref >90)
Glucose: 98 mg/dl (ref 70–140)
POTASSIUM: 4 meq/L (ref 3.5–5.1)
SODIUM: 143 meq/L (ref 136–145)
Total Bilirubin: 0.4 mg/dL (ref 0.20–1.20)
Total Protein: 7.1 g/dL (ref 6.4–8.3)

## 2014-05-08 LAB — CBC WITH DIFFERENTIAL/PLATELET
BASO%: 1.3 % (ref 0.0–2.0)
BASOS ABS: 0.1 10*3/uL (ref 0.0–0.1)
EOS ABS: 0.1 10*3/uL (ref 0.0–0.5)
EOS%: 1.7 % (ref 0.0–7.0)
HCT: 35.3 % — ABNORMAL LOW (ref 38.4–49.9)
HGB: 10.7 g/dL — ABNORMAL LOW (ref 13.0–17.1)
LYMPH%: 28.6 % (ref 14.0–49.0)
MCH: 22 pg — AB (ref 27.2–33.4)
MCHC: 30.3 g/dL — ABNORMAL LOW (ref 32.0–36.0)
MCV: 72.5 fL — ABNORMAL LOW (ref 79.3–98.0)
MONO#: 1.1 10*3/uL — ABNORMAL HIGH (ref 0.1–0.9)
MONO%: 20.2 % — ABNORMAL HIGH (ref 0.0–14.0)
NEUT%: 48.2 % (ref 39.0–75.0)
NEUTROS ABS: 2.5 10*3/uL (ref 1.5–6.5)
Platelets: 184 10*3/uL (ref 140–400)
RBC: 4.87 10*6/uL (ref 4.20–5.82)
RDW: 20.1 % — AB (ref 11.0–14.6)
WBC: 5.2 10*3/uL (ref 4.0–10.3)
lymph#: 1.5 10*3/uL (ref 0.9–3.3)

## 2014-05-08 MED ORDER — LEUCOVORIN CALCIUM INJECTION 350 MG
300.0000 mg/m2 | Freq: Once | INTRAVENOUS | Status: AC
  Administered 2014-05-08: 540 mg via INTRAVENOUS
  Filled 2014-05-08: qty 27

## 2014-05-08 MED ORDER — FAMOTIDINE IN NACL 20-0.9 MG/50ML-% IV SOLN
INTRAVENOUS | Status: AC
  Filled 2014-05-08: qty 50

## 2014-05-08 MED ORDER — OXALIPLATIN CHEMO INJECTION 100 MG/20ML
85.0000 mg/m2 | Freq: Once | INTRAVENOUS | Status: AC
  Administered 2014-05-08: 155 mg via INTRAVENOUS
  Filled 2014-05-08: qty 31

## 2014-05-08 MED ORDER — PALONOSETRON HCL INJECTION 0.25 MG/5ML
0.2500 mg | Freq: Once | INTRAVENOUS | Status: AC
  Administered 2014-05-08: 0.25 mg via INTRAVENOUS

## 2014-05-08 MED ORDER — DIPHENHYDRAMINE HCL 50 MG/ML IJ SOLN
INTRAMUSCULAR | Status: AC
  Filled 2014-05-08: qty 1

## 2014-05-08 MED ORDER — PALONOSETRON HCL INJECTION 0.25 MG/5ML
INTRAVENOUS | Status: AC
  Filled 2014-05-08: qty 5

## 2014-05-08 MED ORDER — DIPHENHYDRAMINE HCL 50 MG/ML IJ SOLN
25.0000 mg | Freq: Once | INTRAMUSCULAR | Status: AC
  Administered 2014-05-08: 25 mg via INTRAVENOUS

## 2014-05-08 MED ORDER — METHYLPREDNISOLONE SODIUM SUCC 125 MG IJ SOLR
INTRAMUSCULAR | Status: AC
  Filled 2014-05-08: qty 2

## 2014-05-08 MED ORDER — METHYLPREDNISOLONE SODIUM SUCC 125 MG IJ SOLR
125.0000 mg | Freq: Once | INTRAMUSCULAR | Status: AC
  Administered 2014-05-08: 125 mg via INTRAVENOUS

## 2014-05-08 MED ORDER — SODIUM CHLORIDE 0.9 % IV SOLN
Freq: Once | INTRAVENOUS | Status: AC
  Administered 2014-05-08: 14:00:00 via INTRAVENOUS

## 2014-05-08 MED ORDER — SODIUM CHLORIDE 0.9 % IV SOLN
1920.0000 mg/m2 | INTRAVENOUS | Status: DC
  Administered 2014-05-08: 3450 mg via INTRAVENOUS
  Filled 2014-05-08: qty 69

## 2014-05-08 MED ORDER — FAMOTIDINE IN NACL 20-0.9 MG/50ML-% IV SOLN
20.0000 mg | Freq: Two times a day (BID) | INTRAVENOUS | Status: DC
  Administered 2014-05-08: 20 mg via INTRAVENOUS

## 2014-05-08 MED ORDER — FLUOROURACIL CHEMO INJECTION 2.5 GM/50ML
300.0000 mg/m2 | Freq: Once | INTRAVENOUS | Status: AC
  Administered 2014-05-08: 550 mg via INTRAVENOUS
  Filled 2014-05-08: qty 11

## 2014-05-08 MED ORDER — FOSAPREPITANT DIMEGLUMINE INJECTION 150 MG
150.0000 mg | Freq: Once | INTRAVENOUS | Status: AC
  Administered 2014-05-08: 150 mg via INTRAVENOUS
  Filled 2014-05-08: qty 5

## 2014-05-08 NOTE — Progress Notes (Signed)
REFERRING PROVIDER: Betsy Coder, MD  PRIMARY PROVIDER:  No PCP Per Patient  PRIMARY REASON FOR VISIT:  1. Gastric cancer   2. Family history of colon cancer   3. Hx of adenomatous colonic polyps      HISTORY OF PRESENT ILLNESS:   Joseph Salinas, a 54 y.o. male, was seen for a [facility] cancer genetics consultation at the request of Dr. Benay Spice due to a personal and family history of cancer.  Joseph Salinas presents to clinic today to discuss the possibility of a hereditary predisposition to cancer, genetic testing, and to further clarify his future cancer risks, as well as potential cancer risks for family members.   In 2014, at the age of 20, Joseph Salinas was diagnosed with gastric cancer. This was treated with chemotherapy. In January 2014 Joseph Salinas had a colonoscopy, his first, which found 19 polyps.    CANCER HISTORY:   No history exists.     RISK FACTORS:  Colonoscopy - 1 Polyps - 19 total, 17 TA and 2 hyperplastic polyps   Past Medical History  Diagnosis Date  . Colon polyp   . Gastric cancer 08/09/2012  . Family history of colon cancer     Past Surgical History  Procedure Laterality Date  . No prior surgery    . Colonoscopy    . Insertion of port a cath Right 08/08/2012  . Esophagogastroduodenoscopy endoscopy    . Biospy  07/31/12    gastric   . Multiple extractions with alveoloplasty N/A 11/10/2012    Procedure: extraction of tooth #'s 1, 2,10,14,15,16,17,18, 23, 24, 25, 26, 32 with alveoloplasty and gross debridement of remaining teeth;  Surgeon: Lenn Cal, DDS;  Location: [facility];  Service: Oral Surgery;  Laterality: N/A;    History   Social History  . Marital Status: Married    Spouse Name: [name]  . Number of Children: 2  . Years of Education: N/A   Social History Main Topics  . Smoking status: Former Smoker -- 0.33 packs/day    Types: Cigarettes    Quit date: 06/20/2012  . Smokeless tobacco: Never Used  . Alcohol Use: No     Comment: not in 1  month;before that 6 beers on a weekend  . Drug Use: No  . Sexual Activity: Not on file   Other Topics Concern  . None   Social History Narrative     FAMILY HISTORY:  We obtained a detailed, 4-generation family history.  Significant diagnoses are listed below: Family History  Problem Relation Age of Onset  . Colon cancer Mother 55  . Leukemia Daughter 6    deceased  . Colon cancer Maternal Grandmother    Joseph Salinas has a 67 YO son and a daughter who had leukemia and died at age 69.  HE has five full siblings - four sisters and one brother - and three paternal half sisters, none of whom had cancer.  His mother was diagnosed with colon cancer at age 63.  She had four sisters and one brother none who had cancer.  Joseph Salinas maternal grandmother had colon cancer in her 37s.  There is no other reported family history of cancer.   Patient's maternal ancestors are of [place] descent, and paternal ancestors are of [place] descent. There is no reported Ashkenazi Jewish ancestry. There is no known consanguinity.  GENETIC COUNSELING ASSESSMENT: Joseph Salinas is a 54 y.o. male with a personal history of gastric cancer and colon polyps and family history  of colon cancer which somewhat suggestive of a hereditary cancer and/or polyposis syndrome and predisposition to cancer. We, therefore, discussed and recommended the following at today's visit.   DISCUSSION: We reviewed the characteristics, features and inheritance patterns of hereditary cancer syndromes. Based on his family history of cancer we discussed the increased risk for Lynch syndrome.  Additionally, based on his personal history of colon polyps, we reviewed hereditary polyposis syndromes caused by mutations in APC and MUTYH.  We also discussed genetic testing, including the appropriate family members to test, the process of testing, insurance coverage and turn-around-time for results. We discussed the implications of a negative, positive  and/or variant of uncertain significant result. We recommended Joseph Salinas pursue genetic testing for the Lynch/High Risk colon cancer gene panel. The Lynch/High Risk colon cancer panel by [facility] analyzes the following seven genes through sequencing and deletion duplication testing: APC, EPCAM, MLH1, MSH2, MSH6, MUTYH, and PMS2.    PLAN: After considering the risks, benefits, and limitations,Joseph Salinas  provided informed consent to pursue genetic testing and the blood sample was sent to [facility] for analysis of the Lynch/High Risk Colon Cancer panel. Results should be available within approximately 2-3 weeks' time, at which point they will be disclosed by telephone to Joseph Salinas, as will any additional recommendations warranted by these results. Joseph Salinas will receive a summary of his genetic counseling visit and a copy of his results once available. This information will also be available in Epic. We encouraged Joseph Salinas to remain in contact with cancer genetics annually so that we can continuously update the family history and inform him of any changes in cancer genetics and testing that may be of benefit for his family. Joseph Salinas questions were answered to his satisfaction today. Our contact information was provided should additional questions or concerns arise.  Lastly, we encouraged Joseph Salinas to remain in contact with cancer genetics annually so that we can continuously update the family history and inform him of any changes in cancer genetics and testing that may be of benefit for this family.   Mr.  Salinas questions were answered to his satisfaction today. Our contact information was provided should additional questions or concerns arise. Thank you for the referral and allowing [place] to share in the care of your patient.   Joseph Salinas P. Florene Glen, [place], [facility] Certified Genetic Counselor [email or web address] phone: [phone]  The patient was seen for a total of 60 minutes in  face-to-face genetic counseling.  This patient was discussed with Drs. Magrinat, Lindi Adie and/or Burr Medico who agrees with the above.    _______________________________________________________________________ For Office Staff:  Number of people involved in session: 2 Was an Intern/ student involved with case: no

## 2014-05-08 NOTE — Telephone Encounter (Signed)
Pt confirmed labs/ov per 02/17 POF, gave pt AVS.... KJ, sent msg to add chemo

## 2014-05-08 NOTE — Progress Notes (Signed)
[facility] OFFICE PROGRESS NOTE   Diagnosis: Gastric cancer  INTERVAL HISTORY:   Joseph Salinas completed another cycle of FOLFOX 04/24/2014. Joseph Salinas developed pruritus and a rash during the oxaliplatin infusion. The rash resolved after Benadryl and additional steroids. Joseph Salinas was able to complete the oxaliplatin infusion. There were no associated symptoms. Joseph Salinas denies peripheral numbness today. Good appetite. Mild nausea following chemotherapy. No abdominal pain at present. Joseph Salinas complains of stiffness and discomfort at the PIP joints of the right greater than left hand.  Objective:  Vital signs in last 24 hours:  Blood pressure 122/82, pulse 72, temperature 98.4 F (36.9 C), temperature source Oral, resp. rate 18, height 5' 3"  (1.6 m), weight 157 lb 11.2 oz (71.532 kg), SpO2 100 %.    HEENT: No thrush or ulcer Resp: Lungs clear bilaterally Cardio: Regular rate and rhythm GI: No hepatomegaly, nontender, no mass Vascular: No leg edema Neuro: Very mild decrease in vibratory sense the fingertips bilaterally , mild resting tremor of the hands Skin: Mild hyperpigmented rash over the trunk Musculoskeletal: No edema or erythema at the hands or fingers joints   Portacath/PICC-without erythema  Lab Results:  Lab Results  Component Value Date   WBC 5.2 05/08/2014   HGB 10.7* 05/08/2014   HCT 35.3* 05/08/2014   MCV 72.5* 05/08/2014   PLT 184 05/08/2014   NEUTROABS 2.5 05/08/2014      Lab Results  Component Value Date   CEA 15.9* 04/24/2014   Medications: I have reviewed the patient's current medications.  Assessment/Plan: 1.Metastatic gastric cancer-status post biopsy of a gastric mass on 07/20/2012 with the pathology confirming adenocarcinoma, HER-2 non-amplified.  - CT 07/18/2012 consistent with a primary gastric mass and metastatic omental/peritoneal nodules  -biopsy of a peritoneal nodule 07/31/2012 consistent with metastatic adenocarcinoma  -Cycle 1 of FOLFOX on 08/02/2012   -Restaging CT 10/23/2012 consistent with a partial response-decrease in the primary gastric tumor and omental/peritoneal implants  -cycle 10 of FOLFOX completed 01/11/2013  -normal CEA 01/11/2013  -Restaging CT 01/23/2013 with improvement in the peritoneal metastases and an increased size of an indeterminate splenic lesion  -Initiation of maintenance Xeloda therapy on a 7 day on/7 day off schedule 01/29/2013.  -Xeloda dose reduced to 1000 mg twice daily 7 days on/7 days off beginning 02/26/2013 due to nausea and diarrhea.  -Xeloda dose reduced to 500 mg twice daily, 7 days/14 days off 03/20/2013.  -Xeloda discontinued 04/25/2013 due to progressive hand foot syndrome.  -CEA elevated at 9.1 on 04/25/2013.  -CEA stable at 9.5 on 05/09/2013.  -Initiation of infusional 5-FU as per the FOLFOX regimen 05/09/2013.  -CEA 7.4 on 05/24/2013.  -CEA 7.9 on 06/21/2013.  -CEA 9.3 on 08/22/2013 .  -CEA 11.1 on 10/01/2013.  -Restaging CT 10/01/2013 with persistent asymmetric gastric wall thickening in the greater curvature of the mid stomach. Dominant index gastrocolic lymph node decreased in size. Other smaller perigastric lymph nodes stable. No new or progressive findings in or around the stomach. Low-density subcapsular splenic lesion resolved.  -Continuation of infusional 5-fluorouracil, switched to a 4 week schedule beginning 01/02/2014 -CT 03/26/2014 with enlargement of the gastric mass and a possible new liver lesion -FOLFOX chemotherapy resumed 03/27/2014 3. Multiple colon polyps documented on a colonoscopy 06/22/2012-tubular adenomas, tubulovillous adenomas, and hyperplastic polyps  4. Family history of colon cancer (mother)  32. Pain secondary to the primary gastric mass or carcinomatosis -resolved  6. 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 Enrique Sack, multiple extractions were recommended  7.  Anorexia, nausea, and early satiety-improved   8. 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 Nausea following chemotherapy-improved with the addition of prophylactic Decadron  10. Skin rash-most likely secondary to Decadron, improved  11. History of neutropenia secondary to chemotherapy  12. 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 Nausea and diarrhea while treated with Xeloda. Improved.  15. Hand-foot syndrome 03/07/2013 manifesting with erythema, dry desquamation and pain on the palms and erythema and pain on the soles. Progressive hand pain 04/25/2013. Xeloda discontinued.  16. Anemia-microcytic; chronic, stable. Ferritin low at 6 11/21/2013. Oral iron initiated. 17. Delayed nausea following cycle 1 FOLFOX. Aloxi added beginning with cycle 2 04/10/2014.  18. Rash and pruritus during the oxaliplatin infusion with cycle 3 FOLFOX, improved with Benadryl, Cytomel, and Pepcid, no associated symptoms    Disposition:  Joseph Salinas has completed 3 cycles of salvage chemotherapy with FOLFOX. His clinical status has improved significantly. The plan is to proceed with cycle 4 today. Joseph Salinas will be premedicated with Benadryl, Pepcid, and Solu-Medrol. Joseph Salinas understands the potential for an allergic reaction despite premedication.  The plan is to schedule a restaging CT after cycle 5 FOLFOX.  Betsy Coder, MD  05/08/2014  12:14 PM

## 2014-05-08 NOTE — Patient Instructions (Signed)
[facility] Discharge Instructions for Patients Receiving Chemotherapy  Today you received the following chemotherapy agents Oxaliplatin, leucovorin and Adrucil.  To help prevent nausea and vomiting after your treatment, we encourage you to take your nausea medication as prescribed.   If you develop nausea and vomiting that is not controlled by your nausea medication, call the clinic.   BELOW ARE SYMPTOMS THAT SHOULD BE REPORTED IMMEDIATELY:  *FEVER GREATER THAN 100.5 F  *CHILLS WITH OR WITHOUT FEVER  NAUSEA AND VOMITING THAT IS NOT CONTROLLED WITH YOUR NAUSEA MEDICATION  *UNUSUAL SHORTNESS OF BREATH  *UNUSUAL BRUISING OR BLEEDING  TENDERNESS IN MOUTH AND THROAT WITH OR WITHOUT PRESENCE OF ULCERS  *URINARY PROBLEMS  *BOWEL PROBLEMS  UNUSUAL RASH Items with * indicate a potential emergency and should be followed up as soon as possible.  Feel free to call the clinic you have any questions or concerns. The clinic phone number is [phone].

## 2014-05-08 NOTE — Telephone Encounter (Signed)
Per staff message and POF I have scheduled appts. Advised scheduler of appts. JMW  

## 2014-05-10 ENCOUNTER — Ambulatory Visit (HOSPITAL_BASED_OUTPATIENT_CLINIC_OR_DEPARTMENT_OTHER): Payer: 59

## 2014-05-10 DIAGNOSIS — C169 Malignant neoplasm of stomach, unspecified: Secondary | ICD-10-CM

## 2014-05-10 MED ORDER — HEPARIN SOD (PORK) LOCK FLUSH 100 UNIT/ML IV SOLN
500.0000 [IU] | Freq: Once | INTRAVENOUS | Status: AC | PRN
  Administered 2014-05-10: 500 [IU]
  Filled 2014-05-10: qty 5

## 2014-05-10 MED ORDER — SODIUM CHLORIDE 0.9 % IJ SOLN
10.0000 mL | INTRAMUSCULAR | Status: DC | PRN
  Administered 2014-05-10: 10 mL
  Filled 2014-05-10: qty 10

## 2014-05-10 NOTE — Patient Instructions (Signed)
Fluorouracil, 5FU; Diclofenac topical cream What is this medicine? FLUOROURACIL; DICLOFENAC (flure oh YOOR a sil; dye KLOE fen ak) is a combination of a topical chemotherapy agent and non-steroidal anti-inflammatory drug (NSAID). It is used on the skin to treat skin cancer and skin conditions that could become cancer. This medicine may be used for other purposes; ask your health care provider or pharmacist if you have questions. COMMON BRAND NAME(S): FLUORAC What should I tell my health care provider before I take this medicine? They need to know if you have any of these conditions: -bleeding problems -cigarette smoker -DPD enzyme deficiency -heart disease -high blood pressure -if you frequently drink alcohol containing drinks -kidney disease -liver disease -open or infected skin -stomach problems -swelling or open sores at the treatment site -recent or planned coronary artery bypass graft (CABG) surgery -an unusual or allergic reaction to fluorouracil, diclofenac, aspirin, other NSAIDs, other medicines, foods, dyes, or preservatives -pregnant or trying to get pregnant -breast-feeding How should I use this medicine? This medicine is only for use on the skin. Follow the directions on the prescription label. Wash hands before and after use. Wash affected area and gently pat dry. To apply this medicine use a cotton-tipped applicator, or use gloves if applying with fingertips. If applied with unprotected fingertips, it is very important to wash your hands well after you apply this medicine. Avoid applying to the eyes, nose, or mouth. Apply enough medicine to cover the affected area. You can cover the area with a light gauze dressing, but do not use tight or air-tight dressings. Finish the full course prescribed by your doctor or health care professional, even if you think your condition is better. Do not stop taking except on the advice of your doctor or health care professional. Talk to your  pediatrician regarding the use of this medicine in children. Special care may be needed. Overdosage: If you think you've taken too much of this medicine contact a poison control center or emergency room at once. Overdosage: If you think you have taken too much of this medicine contact a poison control center or emergency room at once. NOTE: This medicine is only for you. Do not share this medicine with others. What if I miss a dose? If you miss a dose, apply it as soon as you can. If it is almost time for your next dose, only use that dose. Do not apply extra doses. Contact your doctor or health care professional if you miss more than one dose. What may interact with this medicine? Interactions are not expected. Do not use any other skin products without telling your doctor or health care professional. This list may not describe all possible interactions. Give your health care provider a list of all the medicines, herbs, non-prescription drugs, or dietary supplements you use. Also tell them if you smoke, drink alcohol, or use illegal drugs. Some items may interact with your medicine. What should I watch for while using this medicine? Visit your doctor or health care professional for checks on your progress. You will need to use this medicine for 2 to 6 weeks. This may be longer depending on the condition being treated. You may not see full healing for another 1 to 2 months after you stop using the medicine. Treated areas of skin can look unsightly during and for several weeks after treatment with this medicine. This medicine can make you more sensitive to the sun. Keep out of the sun. If you cannot avoid being in   the sun, wear protective clothing and use sunscreen. Do not use sun lamps or tanning beds/booths. Where should I keep my What side effects may I notice from receiving this medicine? Side effects that you should report to your doctor or health care professional as soon as possible: -allergic  reactions like skin rash, itching or hives, swelling of the face, lips, or tongue -black or bloody stools, blood in the urine or vomit -blurred vision -chest pain -difficulty breathing or wheezing -redness, blistering, peeling or loosening of the skin, including inside the mouth -severe redness and swelling of normal skin -slurred speech or weakness on one side of the body -trouble passing urine or change in the amount of urine -unexplained weight gain or swelling -unusually weak or tired -yellowing of eyes or skin Side effects that usually do not require medical attention (Report these to your doctor or health care professional if they continue or are bothersome.): -increased sensitivity of the skin to sun and ultraviolet light -pain and burning of the affected area -scaling or swelling of the affected area -skin rash, itching of the affected area -tenderness This list may not describe all possible side effects. Call your doctor for medical advice about side effects. You may report side effects to FDA at 1-800-FDA-1088. Where should I keep my medicine? Keep out of the reach of children. Store at room temperature between 20 and 25 degrees C (68 and 77 degrees F). Throw away any unused medicine after the expiration date. NOTE: This sheet is a summary. It may not cover all possible information. If you have questions about this medicine, talk to your doctor, pharmacist, or health care provider.  2015, [employer]vier/Gold Standard. (2013-07-09 11:09:58)  

## 2014-05-19 ENCOUNTER — Other Ambulatory Visit: Payer: Self-pay | Admitting: Oncology

## 2014-05-21 ENCOUNTER — Encounter: Payer: Self-pay | Admitting: Genetic Counselor

## 2014-05-21 DIAGNOSIS — Z1379 Encounter for other screening for genetic and chromosomal anomalies: Secondary | ICD-10-CM | POA: Insufficient documentation

## 2014-05-22 ENCOUNTER — Encounter: Payer: Self-pay | Admitting: *Deleted

## 2014-05-22 ENCOUNTER — Ambulatory Visit (HOSPITAL_BASED_OUTPATIENT_CLINIC_OR_DEPARTMENT_OTHER): Payer: 59

## 2014-05-22 ENCOUNTER — Telehealth: Payer: Self-pay | Admitting: Oncology

## 2014-05-22 ENCOUNTER — Ambulatory Visit (HOSPITAL_BASED_OUTPATIENT_CLINIC_OR_DEPARTMENT_OTHER): Payer: 59 | Admitting: Oncology

## 2014-05-22 ENCOUNTER — Other Ambulatory Visit (HOSPITAL_BASED_OUTPATIENT_CLINIC_OR_DEPARTMENT_OTHER): Payer: 59

## 2014-05-22 VITALS — BP 128/87 | HR 75 | Temp 97.9°F | Resp 18 | Ht 63.0 in | Wt 160.9 lb

## 2014-05-22 DIAGNOSIS — C169 Malignant neoplasm of stomach, unspecified: Secondary | ICD-10-CM

## 2014-05-22 DIAGNOSIS — C786 Secondary malignant neoplasm of retroperitoneum and peritoneum: Secondary | ICD-10-CM

## 2014-05-22 DIAGNOSIS — Z5111 Encounter for antineoplastic chemotherapy: Secondary | ICD-10-CM

## 2014-05-22 LAB — CBC WITH DIFFERENTIAL/PLATELET
BASO%: 1.8 % (ref 0.0–2.0)
Basophils Absolute: 0.1 10*3/uL (ref 0.0–0.1)
EOS%: 1.8 % (ref 0.0–7.0)
Eosinophils Absolute: 0.1 10*3/uL (ref 0.0–0.5)
HEMATOCRIT: 33 % — AB (ref 38.4–49.9)
HGB: 9.8 g/dL — ABNORMAL LOW (ref 13.0–17.1)
LYMPH%: 25.5 % (ref 14.0–49.0)
MCH: 21.3 pg — AB (ref 27.2–33.4)
MCHC: 29.8 g/dL — ABNORMAL LOW (ref 32.0–36.0)
MCV: 71.3 fL — ABNORMAL LOW (ref 79.3–98.0)
MONO#: 0.9 10*3/uL (ref 0.1–0.9)
MONO%: 18.4 % — ABNORMAL HIGH (ref 0.0–14.0)
NEUT#: 2.4 10*3/uL (ref 1.5–6.5)
NEUT%: 52.5 % (ref 39.0–75.0)
Platelets: 165 10*3/uL (ref 140–400)
RBC: 4.63 10*6/uL (ref 4.20–5.82)
RDW: 21.6 % — ABNORMAL HIGH (ref 11.0–14.6)
WBC: 4.6 10*3/uL (ref 4.0–10.3)
lymph#: 1.2 10*3/uL (ref 0.9–3.3)

## 2014-05-22 LAB — COMPREHENSIVE METABOLIC PANEL (CC13)
ALT: 7 U/L (ref 0–55)
ANION GAP: 8 meq/L (ref 3–11)
AST: 14 U/L (ref 5–34)
Albumin: 3.6 g/dL (ref 3.5–5.0)
Alkaline Phosphatase: 83 U/L (ref 40–150)
BILIRUBIN TOTAL: 0.47 mg/dL (ref 0.20–1.20)
BUN: 8.9 mg/dL (ref 7.0–26.0)
CO2: 25 mEq/L (ref 22–29)
CREATININE: 0.9 mg/dL (ref 0.7–1.3)
Calcium: 9 mg/dL (ref 8.4–10.4)
Chloride: 109 mEq/L (ref 98–109)
EGFR: >90 mL/min/{1.73_m2} (ref >90)
Glucose: 99 mg/dl (ref 70–140)
Potassium: 4.2 mEq/L (ref 3.5–5.1)
Sodium: 142 mEq/L (ref 136–145)
Total Protein: 6.8 g/dL (ref 6.4–8.3)

## 2014-05-22 LAB — CEA: CEA: 14.4 ng/mL — ABNORMAL HIGH (ref 0.0–5.0)

## 2014-05-22 MED ORDER — SODIUM CHLORIDE 0.9 % IV SOLN
Freq: Once | INTRAVENOUS | Status: AC
  Administered 2014-05-22: 11:00:00 via INTRAVENOUS

## 2014-05-22 MED ORDER — DIPHENHYDRAMINE HCL 50 MG/ML IJ SOLN
25.0000 mg | Freq: Once | INTRAMUSCULAR | Status: AC
  Administered 2014-05-22: 25 mg via INTRAVENOUS

## 2014-05-22 MED ORDER — FLUOROURACIL CHEMO INJECTION 2.5 GM/50ML
300.0000 mg/m2 | Freq: Once | INTRAVENOUS | Status: AC
  Administered 2014-05-22: 550 mg via INTRAVENOUS
  Filled 2014-05-22: qty 11

## 2014-05-22 MED ORDER — PALONOSETRON HCL INJECTION 0.25 MG/5ML
INTRAVENOUS | Status: AC
  Filled 2014-05-22: qty 5

## 2014-05-22 MED ORDER — DEXTROSE 5 % IV SOLN
85.0000 mg/m2 | Freq: Once | INTRAVENOUS | Status: AC
  Administered 2014-05-22: 155 mg via INTRAVENOUS
  Filled 2014-05-22: qty 31

## 2014-05-22 MED ORDER — FOSAPREPITANT DIMEGLUMINE INJECTION 150 MG
150.0000 mg | Freq: Once | INTRAVENOUS | Status: AC
  Administered 2014-05-22: 150 mg via INTRAVENOUS
  Filled 2014-05-22: qty 5

## 2014-05-22 MED ORDER — LEUCOVORIN CALCIUM INJECTION 350 MG
300.0000 mg/m2 | Freq: Once | INTRAVENOUS | Status: AC
  Administered 2014-05-22: 540 mg via INTRAVENOUS
  Filled 2014-05-22: qty 27

## 2014-05-22 MED ORDER — PALONOSETRON HCL INJECTION 0.25 MG/5ML
0.2500 mg | Freq: Once | INTRAVENOUS | Status: AC
  Administered 2014-05-22: 0.25 mg via INTRAVENOUS

## 2014-05-22 MED ORDER — METHYLPREDNISOLONE SODIUM SUCC 125 MG IJ SOLR
125.0000 mg | Freq: Once | INTRAMUSCULAR | Status: AC
  Administered 2014-05-22: 125 mg via INTRAVENOUS

## 2014-05-22 MED ORDER — FAMOTIDINE IN NACL 20-0.9 MG/50ML-% IV SOLN
INTRAVENOUS | Status: AC
  Filled 2014-05-22: qty 50

## 2014-05-22 MED ORDER — DIPHENHYDRAMINE HCL 50 MG/ML IJ SOLN
INTRAMUSCULAR | Status: AC
  Filled 2014-05-22: qty 1

## 2014-05-22 MED ORDER — SODIUM CHLORIDE 0.9 % IV SOLN: Freq: Once | INTRAVENOUS | Status: DC

## 2014-05-22 MED ORDER — FAMOTIDINE IN NACL 20-0.9 MG/50ML-% IV SOLN
20.0000 mg | Freq: Two times a day (BID) | INTRAVENOUS | Status: DC
  Administered 2014-05-22: 20 mg via INTRAVENOUS

## 2014-05-22 MED ORDER — SODIUM CHLORIDE 0.9 % IV SOLN
1920.0000 mg/m2 | INTRAVENOUS | Status: DC
  Administered 2014-05-22: 3450 mg via INTRAVENOUS
  Filled 2014-05-22: qty 69

## 2014-05-22 MED ORDER — METHYLPREDNISOLONE SODIUM SUCC 125 MG IJ SOLR
INTRAMUSCULAR | Status: AC
  Filled 2014-05-22: qty 2

## 2014-05-22 NOTE — Progress Notes (Signed)
Joseph Salinas OFFICE PROGRESS NOTE   Diagnosis: Gastric cancer  INTERVAL HISTORY:   He returns as scheduled. He completed another cycle of FOLFOX 05/08/2014. No symptom of an allergic reaction. No neuropathy symptoms. No mouth sores or nausea. Good appetite. No abdominal pain.  Objective:  Vital signs in last 24 hours:  Blood pressure 128/87, pulse 75, temperature 97.9 F (36.6 C), temperature source Oral, resp. rate 18, height 5' 3"  (1.6 m), weight 160 lb 14.4 oz (72.984 kg).    HEENT: No thrush or ulcers Resp: Lungs clear bilaterally Cardio: Regular rate and rhythm GI: No hepatomegaly, nontender, no mass Vascular: No leg edema Neuro: The vibratory sense is intact at the fingertips bilaterally  Skin: Mild follicular rash over the trunk   Portacath/PICC-without erythema  Lab Results:  Lab Results  Component Value Date   WBC 4.6 05/22/2014   HGB 9.8* 05/22/2014   HCT 33.0* 05/22/2014   MCV 71.3* 05/22/2014   PLT 165 05/22/2014   NEUTROABS 2.4 05/22/2014     Lab Results  Component Value Date   CEA 15.9* 04/24/2014    Imaging:  No results found.  Medications: I have reviewed the patient's current medications.  Assessment/Plan: 1.Metastatic gastric cancer-status post biopsy of a gastric mass on 07/20/2012 with the pathology confirming adenocarcinoma, HER-2 non-amplified.  - CT 07/18/2012 consistent with a primary gastric mass and metastatic omental/peritoneal nodules  -biopsy of a peritoneal nodule 07/31/2012 consistent with metastatic adenocarcinoma  -Cycle 1 of FOLFOX on 08/02/2012  -Restaging CT 10/23/2012 consistent with a partial response-decrease in the primary gastric tumor and omental/peritoneal implants  -cycle 10 of FOLFOX completed 01/11/2013  -normal CEA 01/11/2013  -Restaging CT 01/23/2013 with improvement in the peritoneal metastases and an increased size of an indeterminate splenic lesion  -Initiation of maintenance Xeloda  therapy on a 7 day on/7 day off schedule 01/29/2013.  -Xeloda dose reduced to 1000 mg twice daily 7 days on/7 days off beginning 02/26/2013 due to nausea and diarrhea.  -Xeloda dose reduced to 500 mg twice daily, 7 days/14 days off 03/20/2013.  -Xeloda discontinued 04/25/2013 due to progressive hand foot syndrome.  -CEA elevated at 9.1 on 04/25/2013.  -CEA stable at 9.5 on 05/09/2013.  -Initiation of infusional 5-FU as per the FOLFOX regimen 05/09/2013.  -CEA 7.4 on 05/24/2013.  -CEA 7.9 on 06/21/2013.  -CEA 9.3 on 08/22/2013 .  -CEA 11.1 on 10/01/2013.  -Restaging CT 10/01/2013 with persistent asymmetric gastric wall thickening in the greater curvature of the mid stomach. Dominant index gastrocolic lymph node decreased in size. Other smaller perigastric lymph nodes stable. No new or progressive findings in or around the stomach. Low-density subcapsular splenic lesion resolved.  -Continuation of infusional 5-fluorouracil, switched to a 4 week schedule beginning 01/02/2014 -CT 03/26/2014 with enlargement of the gastric mass and a possible new liver lesion -FOLFOX chemotherapy resumed 03/27/2014 3. Multiple colon polyps documented on a colonoscopy 06/22/2012-tubular adenomas, tubulovillous adenomas, and hyperplastic polyps  4. Family history of colon cancer (mother)  33. Pain secondary to the primary gastric mass or carcinomatosis -resolved  6. 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 Enrique Sack, multiple extractions were recommended  7. Anorexia, nausea, and early satiety-improved  8. 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 Nausea following chemotherapy-improved with the addition of prophylactic Decadron  10. Skin rash-most likely secondary to Decadron, improved  11. History of neutropenia secondary to chemotherapy  12. 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 Nausea and diarrhea while treated with Xeloda. Improved.  15. Hand-foot syndrome 03/07/2013 manifesting with erythema, dry desquamation and pain on the palms and erythema and pain on the soles. Progressive hand pain 04/25/2013. Xeloda discontinued.  16. Anemia-microcytic; chronic, stable. Ferritin low at 6 11/21/2013. Oral iron initiated. 17. Delayed nausea following cycle 1 FOLFOX. Aloxi added beginning with cycle 2 04/10/2014.  18. Rash and pruritus during the oxaliplatin infusion with cycle 3 FOLFOX, improved with Benadryl, Cytomel, and Pepcid, no associated symptoms, no rash with cycle 4 FOLFOX 05/08/2014   Disposition:  Joseph Salinas appears well. He will complete a fifth cycle of salvage FOLFOX therapy today. He will undergo a restaging CT evaluation after this cycle. Joseph Salinas will return for an office visit and chemotherapy in 2 weeks.  Betsy Coder, MD  05/22/2014  10:28 AM

## 2014-05-22 NOTE — Telephone Encounter (Signed)
gv and printed appt sched and avs for pt for March...sed added tx  °

## 2014-05-22 NOTE — Patient Instructions (Signed)
[facility] Discharge Instructions for Patients Receiving Chemotherapy  Today you received the following chemotherapy agents: Oxaliplatin, Leucovorin, 5FU  To help prevent nausea and vomiting after your treatment, we encourage you to take your nausea medication:Compazine 10 mg every 6 hours as needed.   If you develop nausea and vomiting that is not controlled by your nausea medication, call the clinic.   BELOW ARE SYMPTOMS THAT SHOULD BE REPORTED IMMEDIATELY:  *FEVER GREATER THAN 100.5 F  *CHILLS WITH OR WITHOUT FEVER  NAUSEA AND VOMITING THAT IS NOT CONTROLLED WITH YOUR NAUSEA MEDICATION  *UNUSUAL SHORTNESS OF BREATH  *UNUSUAL BRUISING OR BLEEDING  TENDERNESS IN MOUTH AND THROAT WITH OR WITHOUT PRESENCE OF ULCERS  *URINARY PROBLEMS  *BOWEL PROBLEMS  UNUSUAL RASH Items with * indicate a potential emergency and should be followed up as soon as possible.  Feel free to call the clinic you have any questions or concerns. The clinic phone number is [phone].

## 2014-05-23 ENCOUNTER — Encounter: Payer: Self-pay | Admitting: Internal Medicine

## 2014-05-24 ENCOUNTER — Ambulatory Visit (HOSPITAL_BASED_OUTPATIENT_CLINIC_OR_DEPARTMENT_OTHER): Payer: 59

## 2014-05-24 DIAGNOSIS — Z452 Encounter for adjustment and management of vascular access device: Secondary | ICD-10-CM

## 2014-05-24 DIAGNOSIS — C169 Malignant neoplasm of stomach, unspecified: Secondary | ICD-10-CM

## 2014-05-24 MED ORDER — HEPARIN SOD (PORK) LOCK FLUSH 100 UNIT/ML IV SOLN
500.0000 [IU] | Freq: Once | INTRAVENOUS | Status: AC | PRN
  Administered 2014-05-24: 500 [IU]
  Filled 2014-05-24: qty 5

## 2014-05-24 MED ORDER — SODIUM CHLORIDE 0.9 % IJ SOLN
10.0000 mL | INTRAMUSCULAR | Status: DC | PRN
  Administered 2014-05-24: 10 mL
  Filled 2014-05-24: qty 10

## 2014-05-24 NOTE — Patient Instructions (Signed)
Fluorouracil, 5FU; Diclofenac topical cream What is this medicine? FLUOROURACIL; DICLOFENAC (flure oh YOOR a sil; dye KLOE fen ak) is a combination of a topical chemotherapy agent and non-steroidal anti-inflammatory drug (NSAID). It is used on the skin to treat skin cancer and skin conditions that could become cancer. This medicine may be used for other purposes; ask your health care provider or pharmacist if you have questions. COMMON BRAND NAME(S): FLUORAC What should I tell my health care provider before I take this medicine? They need to know if you have any of these conditions: -bleeding problems -cigarette smoker -DPD enzyme deficiency -heart disease -high blood pressure -if you frequently drink alcohol containing drinks -kidney disease -liver disease -open or infected skin -stomach problems -swelling or open sores at the treatment site -recent or planned coronary artery bypass graft (CABG) surgery -an unusual or allergic reaction to fluorouracil, diclofenac, aspirin, other NSAIDs, other medicines, foods, dyes, or preservatives -pregnant or trying to get pregnant -breast-feeding How should I use this medicine? This medicine is only for use on the skin. Follow the directions on the prescription label. Wash hands before and after use. Wash affected area and gently pat dry. To apply this medicine use a cotton-tipped applicator, or use gloves if applying with fingertips. If applied with unprotected fingertips, it is very important to wash your hands well after you apply this medicine. Avoid applying to the eyes, nose, or mouth. Apply enough medicine to cover the affected area. You can cover the area with a light gauze dressing, but do not use tight or air-tight dressings. Finish the full course prescribed by your doctor or health care professional, even if you think your condition is better. Do not stop taking except on the advice of your doctor or health care professional. Talk to your  pediatrician regarding the use of this medicine in children. Special care may be needed. Overdosage: If you think you've taken too much of this medicine contact a poison control center or emergency room at once. Overdosage: If you think you have taken too much of this medicine contact a poison control center or emergency room at once. NOTE: This medicine is only for you. Do not share this medicine with others. What if I miss a dose? If you miss a dose, apply it as soon as you can. If it is almost time for your next dose, only use that dose. Do not apply extra doses. Contact your doctor or health care professional if you miss more than one dose. What may interact with this medicine? Interactions are not expected. Do not use any other skin products without telling your doctor or health care professional. This list may not describe all possible interactions. Give your health care provider a list of all the medicines, herbs, non-prescription drugs, or dietary supplements you use. Also tell them if you smoke, drink alcohol, or use illegal drugs. Some items may interact with your medicine. What should I watch for while using this medicine? Visit your doctor or health care professional for checks on your progress. You will need to use this medicine for 2 to 6 weeks. This may be longer depending on the condition being treated. You may not see full healing for another 1 to 2 months after you stop using the medicine. Treated areas of skin can look unsightly during and for several weeks after treatment with this medicine. This medicine can make you more sensitive to the sun. Keep out of the sun. If you cannot avoid being in   the sun, wear protective clothing and use sunscreen. Do not use sun lamps or tanning beds/booths. Where should I keep my What side effects may I notice from receiving this medicine? Side effects that you should report to your doctor or health care professional as soon as possible: -allergic  reactions like skin rash, itching or hives, swelling of the face, lips, or tongue -black or bloody stools, blood in the urine or vomit -blurred vision -chest pain -difficulty breathing or wheezing -redness, blistering, peeling or loosening of the skin, including inside the mouth -severe redness and swelling of normal skin -slurred speech or weakness on one side of the body -trouble passing urine or change in the amount of urine -unexplained weight gain or swelling -unusually weak or tired -yellowing of eyes or skin Side effects that usually do not require medical attention (Report these to your doctor or health care professional if they continue or are bothersome.): -increased sensitivity of the skin to sun and ultraviolet light -pain and burning of the affected area -scaling or swelling of the affected area -skin rash, itching of the affected area -tenderness This list may not describe all possible side effects. Call your doctor for medical advice about side effects. You may report side effects to FDA at 1-800-FDA-1088. Where should I keep my medicine? Keep out of the reach of children. Store at room temperature between 20 and 25 degrees C (68 and 77 degrees F). Throw away any unused medicine after the expiration date. NOTE: This sheet is a summary. It may not cover all possible information. If you have questions about this medicine, talk to your doctor, pharmacist, or health care provider.  2015, [employer]vier/Gold Standard. (2013-07-09 11:09:58)  

## 2014-05-28 ENCOUNTER — Telehealth: Payer: Self-pay | Admitting: Genetic Counselor

## 2014-05-28 ENCOUNTER — Encounter: Payer: Self-pay | Admitting: Genetic Counselor

## 2014-05-28 NOTE — Telephone Encounter (Signed)
Spoke with wife, [name], and revealed negative genetic testing on the Lynch/High Risk colon panel test.  Discussed possibly pursuing additional genes associated with colon cancer, which some have a risk for gastric cancer.  Joseph Salinas felt that they did not want to pursue further testing at this time, but would talk with Dr. Benay Spice about this.

## 2014-05-28 NOTE — Progress Notes (Signed)
HPI: Mr. Bastin was previously seen in the [facility] due to a personal history of gastric cancer and colon polyps and family history of cancer and concerns regarding a hereditary predisposition to cancer. Please refer to our prior cancer genetics clinic note for more information regarding Mr. Treadway's medical, social and family histories, and our assessment and recommendations, at the time. Mr. Andrepont recent genetic test results were disclosed to him, as were recommendations warranted by these results. These results and recommendations are discussed in more detail below.  GENETIC TEST RESULTS: At the time of Mr. Basnett visit, we recommended he pursue genetic testing of the Lynch/High Risk Colon Cancer gene panel and MSH2 Exons 1-7 Inversion Analysis. The Lynch/Colorectal high risk panel offered by [facility] performs sequencing and deletion/duplicaton testing on the following 7 genes: APC, EPCAM, MLH1, MSH2, MSH6, MUTYH and PMS2.   The report date is May 17, 2014 and May 24, 2014, respectively.  Testing was performed at [facility] Genetic testing was normal, and did not reveal a deleterious mutation in these genes. The test report has been scanned into EPIC and is located under the Media tab.   We discussed with Mr. Matsuura wife, [name], that since the current genetic testing is not perfect, it is possible there may be a gene mutation in one of these genes that current testing cannot detect, but that chance is small. We also discussed, that it is possible that another gene that has not yet been discovered, or that we have not yet tested, is responsible for the cancer diagnoses in the family, and it is, therefore, important to remain in touch with cancer genetics in the future so that we can continue to offer Mr. Taney the most up to date genetic testing.   I offered to order additional testing for genes associated with colon cancer, some of which can  be associated with stomach cancer.  I explained that some of the genes that are associated with gastric cancer have different types of colon polyps (juvenile polyps) than were found on Ms. Victorio's colonoscopy (tubular adenomas).  We could use the same blood sample to add these genes onto testing.  The lab would perform another preauthorization and notify them if they owe over $100.  Ms. Kerstein stated that she will talk with her husband about this option, as well as discuss with Dr. Benay Spice.  CANCER SCREENING RECOMMENDATIONS: This result is reassuring and suggests that Mr. Bekker personal and family history of cancer was most likely not due to an inherited predisposition associated with one of these genes. Most cancers happen by chance and this negative test, along with details of his family history, suggests that his cancer falls into this category. We, therefore, recommended he continue to follow the cancer management and screening guidelines provided by his oncology and primary providers.   FOLLOW-UP: Lastly, we discussed with Mr. Arrazola that cancer genetics is a rapidly advancing field and it is possible that new genetic tests will be appropriate for him and/or his family members in the future. We encouraged him to remain in contact with cancer genetics on an annual basis so we can update his personal and family histories and let him know of advances in cancer genetics that may benefit this family.   Our contact number was provided. Mr. Heidenreich questions were answered to his satisfaction, and she knows he is welcome to call us at anytime with additional questions or concerns.   Roma Kayser, MS, [facility] Certified  Genetic Counselor [email or web address]   

## 2014-06-02 ENCOUNTER — Other Ambulatory Visit: Payer: Self-pay | Admitting: Oncology

## 2014-06-03 ENCOUNTER — Ambulatory Visit (HOSPITAL_COMMUNITY)
Admission: RE | Admit: 2014-06-03 | Discharge: 2014-06-03 | Disposition: A | Discharge status: Home / Self Care | Payer: 59 | Source: Ambulatory Visit | Attending: Oncology | Admitting: Oncology

## 2014-06-03 ENCOUNTER — Encounter (HOSPITAL_COMMUNITY): Payer: Self-pay

## 2014-06-03 DIAGNOSIS — C169 Malignant neoplasm of stomach, unspecified: Secondary | ICD-10-CM | POA: Insufficient documentation

## 2014-06-03 DIAGNOSIS — Z79899 Other long term (current) drug therapy: Secondary | ICD-10-CM | POA: Diagnosis not present

## 2014-06-03 MED ORDER — IOHEXOL 300 MG/ML  SOLN
100.0000 mL | Freq: Once | INTRAMUSCULAR | Status: AC | PRN
  Administered 2014-06-03: 100 mL via INTRAVENOUS

## 2014-06-05 ENCOUNTER — Ambulatory Visit (HOSPITAL_BASED_OUTPATIENT_CLINIC_OR_DEPARTMENT_OTHER): Payer: 59 | Admitting: Nurse Practitioner

## 2014-06-05 ENCOUNTER — Telehealth: Payer: Self-pay | Admitting: *Deleted

## 2014-06-05 ENCOUNTER — Telehealth: Payer: Self-pay | Admitting: Nurse Practitioner

## 2014-06-05 ENCOUNTER — Ambulatory Visit (HOSPITAL_BASED_OUTPATIENT_CLINIC_OR_DEPARTMENT_OTHER): Payer: 59

## 2014-06-05 ENCOUNTER — Other Ambulatory Visit (HOSPITAL_BASED_OUTPATIENT_CLINIC_OR_DEPARTMENT_OTHER): Payer: 59

## 2014-06-05 VITALS — BP 120/66 | HR 92 | Temp 98.1°F | Resp 18 | Ht 63.0 in | Wt 162.5 lb

## 2014-06-05 DIAGNOSIS — D509 Iron deficiency anemia, unspecified: Secondary | ICD-10-CM

## 2014-06-05 DIAGNOSIS — C169 Malignant neoplasm of stomach, unspecified: Secondary | ICD-10-CM

## 2014-06-05 DIAGNOSIS — C786 Secondary malignant neoplasm of retroperitoneum and peritoneum: Secondary | ICD-10-CM

## 2014-06-05 DIAGNOSIS — Z5111 Encounter for antineoplastic chemotherapy: Secondary | ICD-10-CM

## 2014-06-05 LAB — COMPREHENSIVE METABOLIC PANEL (CC13)
ALK PHOS: 77 U/L (ref 40–150)
ALT: 12 U/L (ref 0–55)
AST: 13 U/L (ref 5–34)
Albumin: 3.3 g/dL — ABNORMAL LOW (ref 3.5–5.0)
Anion Gap: 7 mEq/L (ref 3–11)
BUN: 9.4 mg/dL (ref 7.0–26.0)
CO2: 25 meq/L (ref 22–29)
CREATININE: 0.8 mg/dL (ref 0.7–1.3)
Calcium: 8.7 mg/dL (ref 8.4–10.4)
Chloride: 110 mEq/L — ABNORMAL HIGH (ref 98–109)
EGFR: >90 mL/min/{1.73_m2} (ref >90)
Glucose: 113 mg/dl (ref 70–140)
Potassium: 3.9 mEq/L (ref 3.5–5.1)
Sodium: 142 mEq/L (ref 136–145)
Total Bilirubin: 0.38 mg/dL (ref 0.20–1.20)
Total Protein: 6.5 g/dL (ref 6.4–8.3)

## 2014-06-05 LAB — CBC WITH DIFFERENTIAL/PLATELET
BASO%: 1.7 % (ref 0.0–2.0)
Basophils Absolute: 0.1 10*3/uL (ref 0.0–0.1)
EOS%: 2.7 % (ref 0.0–7.0)
Eosinophils Absolute: 0.1 10*3/uL (ref 0.0–0.5)
HEMATOCRIT: 31.9 % — AB (ref 38.4–49.9)
HGB: 9.4 g/dL — ABNORMAL LOW (ref 13.0–17.1)
LYMPH#: 1.2 10*3/uL (ref 0.9–3.3)
LYMPH%: 28 % (ref 14.0–49.0)
MCH: 21.1 pg — ABNORMAL LOW (ref 27.2–33.4)
MCHC: 29.5 g/dL — ABNORMAL LOW (ref 32.0–36.0)
MCV: 71.4 fL — ABNORMAL LOW (ref 79.3–98.0)
MONO#: 0.7 10*3/uL (ref 0.1–0.9)
MONO%: 16.8 % — ABNORMAL HIGH (ref 0.0–14.0)
NEUT#: 2.1 10*3/uL (ref 1.5–6.5)
NEUT%: 50.8 % (ref 39.0–75.0)
Platelets: 158 10*3/uL (ref 140–400)
RBC: 4.47 10*6/uL (ref 4.20–5.82)
RDW: 22.1 % — ABNORMAL HIGH (ref 11.0–14.6)
WBC: 4.2 10*3/uL (ref 4.0–10.3)

## 2014-06-05 LAB — CEA: CEA: 11.3 ng/mL — ABNORMAL HIGH (ref 0.0–5.0)

## 2014-06-05 MED ORDER — DIPHENHYDRAMINE HCL 50 MG/ML IJ SOLN
25.0000 mg | Freq: Once | INTRAMUSCULAR | Status: AC
  Administered 2014-06-05: 25 mg via INTRAVENOUS

## 2014-06-05 MED ORDER — LEUCOVORIN CALCIUM INJECTION 350 MG
300.0000 mg/m2 | Freq: Once | INTRAMUSCULAR | Status: AC
  Administered 2014-06-05: 540 mg via INTRAVENOUS
  Filled 2014-06-05: qty 27

## 2014-06-05 MED ORDER — METHYLPREDNISOLONE SODIUM SUCC 125 MG IJ SOLR
INTRAMUSCULAR | Status: AC
  Filled 2014-06-05: qty 2

## 2014-06-05 MED ORDER — PALONOSETRON HCL INJECTION 0.25 MG/5ML
INTRAVENOUS | Status: AC
  Filled 2014-06-05: qty 5

## 2014-06-05 MED ORDER — SODIUM CHLORIDE 0.9 % IV SOLN
150.0000 mg | Freq: Once | INTRAVENOUS | Status: AC
  Administered 2014-06-05: 150 mg via INTRAVENOUS
  Filled 2014-06-05: qty 5

## 2014-06-05 MED ORDER — OXALIPLATIN CHEMO INJECTION 100 MG/20ML
85.0000 mg/m2 | Freq: Once | INTRAVENOUS | Status: AC
  Administered 2014-06-05: 155 mg via INTRAVENOUS
  Filled 2014-06-05: qty 31

## 2014-06-05 MED ORDER — METHYLPREDNISOLONE SODIUM SUCC 125 MG IJ SOLR
125.0000 mg | Freq: Once | INTRAMUSCULAR | Status: AC
  Administered 2014-06-05: 125 mg via INTRAVENOUS

## 2014-06-05 MED ORDER — SODIUM CHLORIDE 0.9 % IV SOLN
1920.0000 mg/m2 | INTRAVENOUS | Status: DC
  Administered 2014-06-05: 3450 mg via INTRAVENOUS
  Filled 2014-06-05: qty 69

## 2014-06-05 MED ORDER — FAMOTIDINE IN NACL 20-0.9 MG/50ML-% IV SOLN
20.0000 mg | Freq: Two times a day (BID) | INTRAVENOUS | Status: DC
  Administered 2014-06-05: 20 mg via INTRAVENOUS

## 2014-06-05 MED ORDER — PALONOSETRON HCL INJECTION 0.25 MG/5ML
0.2500 mg | Freq: Once | INTRAVENOUS | Status: AC
  Administered 2014-06-05: 0.25 mg via INTRAVENOUS

## 2014-06-05 MED ORDER — FLUOROURACIL CHEMO INJECTION 2.5 GM/50ML
300.0000 mg/m2 | Freq: Once | INTRAVENOUS | Status: AC
  Administered 2014-06-05: 550 mg via INTRAVENOUS
  Filled 2014-06-05: qty 11

## 2014-06-05 MED ORDER — SODIUM CHLORIDE 0.9 % IV SOLN: Freq: Once | INTRAVENOUS | Status: DC

## 2014-06-05 MED ORDER — FAMOTIDINE IN NACL 20-0.9 MG/50ML-% IV SOLN
INTRAVENOUS | Status: AC
  Filled 2014-06-05: qty 50

## 2014-06-05 MED ORDER — DIPHENHYDRAMINE HCL 50 MG/ML IJ SOLN
INTRAMUSCULAR | Status: AC
  Filled 2014-06-05: qty 1

## 2014-06-05 NOTE — Telephone Encounter (Signed)
Per staff message and POF I have scheduled appts. Advised scheduler of appts. JMW  

## 2014-06-05 NOTE — Progress Notes (Signed)
[name] OFFICE PROGRESS NOTE   Diagnosis: Gastric cance   INTERVAL HISTORY:   Joseph Salinas returns as scheduled. He completed cycle 5 FOLFOX on 05/22/2014. He denies nausea/vomiting. No mouth sores. No diarrhea. The cold sensitivity lasted 5 days. No persistent neuropathy symptoms. He continues to have a good appetite. No abdominal pain.  Objective:  Vital signs in last 24 hours:  Blood pressure 120/66, pulse 92, temperature 98.1 F (36.7 C), temperature source Oral, resp. rate 18, height 5' 3"  (1.6 m), weight 162 lb 8 oz (73.71 kg), SpO2 97 %.    HEENT: No thrush or ulcers. Resp: Lungs clear bilaterally. Cardio: Regular rate and rhythm. GI: Abdomen soft and nontender. No hepatomegaly. No mass. Vascular: No leg edema. Calves soft and nontender. Neuro: Vibratory sense mildly decreased over the fingertips per tuning fork exam.  Skin: Follicular-appearing rash over the trunk. Port-A-Cath without erythema    Lab Results:  Lab Results  Component Value Date   WBC 4.2 06/05/2014   HGB 9.4* 06/05/2014   HCT 31.9* 06/05/2014   MCV 71.4* 06/05/2014   PLT 158 06/05/2014   NEUTROABS 2.1 06/05/2014    Imaging:  No results found.  Medications: I have reviewed the patient's current medications.  Assessment/Plan: 1.Metastatic gastric cancer-status post biopsy of a gastric mass on 07/20/2012 with the pathology confirming adenocarcinoma, HER-2 non-amplified.  - CT 07/18/2012 consistent with a primary gastric mass and metastatic omental/peritoneal nodules  -biopsy of a peritoneal nodule 07/31/2012 consistent with metastatic adenocarcinoma  -Cycle 1 of FOLFOX on 08/02/2012  -Restaging CT 10/23/2012 consistent with a partial response-decrease in the primary gastric tumor and omental/peritoneal implants  -cycle 10 of FOLFOX completed 01/11/2013  -normal CEA 01/11/2013  -Restaging CT 01/23/2013 with improvement in the peritoneal metastases and an increased  size of an indeterminate splenic lesion  -Initiation of maintenance Xeloda therapy on a 7 day on/7 day off schedule 01/29/2013.  -Xeloda dose reduced to 1000 mg twice daily 7 days on/7 days off beginning 02/26/2013 due to nausea and diarrhea.  -Xeloda dose reduced to 500 mg twice daily, 7 days/14 days off 03/20/2013.  -Xeloda discontinued 04/25/2013 due to progressive hand foot syndrome.  -CEA elevated at 9.1 on 04/25/2013.  -CEA stable at 9.5 on 05/09/2013.  -Initiation of infusional 5-FU as per the FOLFOX regimen 05/09/2013.  -CEA 7.4 on 05/24/2013.  -CEA 7.9 on 06/21/2013.  -CEA 9.3 on 08/22/2013 .  -CEA 11.1 on 10/01/2013.  -Restaging CT 10/01/2013 with persistent asymmetric gastric wall thickening in the greater curvature of the mid stomach. Dominant index gastrocolic lymph node decreased in size. Other smaller perigastric lymph nodes stable. No new or progressive findings in or around the stomach. Low-density subcapsular splenic lesion resolved.  -Continuation of infusional 5-fluorouracil, switched to a 4 week schedule beginning 01/02/2014 -CT 03/26/2014 with enlargement of the gastric mass and a possible new liver lesion -FOLFOX chemotherapy resumed 03/27/2014 -Restaging CT scan 06/03/2014 with mild decrease in volume of gastric wall mass. Decrease in volume of perigastric metastatic lymph node. Stable adrenal nodularity. No evidence of disease progression. -Cycle 6 FOLFOX 06/05/2014 3. Multiple colon polyps documented on a colonoscopy 06/22/2012-tubular adenomas, tubulovillous adenomas, and hyperplastic polyps  4. Family history of colon cancer (mother). Status post genetics evaluation. Negative genetic testing on the Lynch/High Risk colon panel test  5. Pain secondary to the primary gastric mass or carcinomatosis -resolved  6. 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 Enrique Sack, multiple extractions were recommended  7.  Anorexia,  nausea, and early satiety-improved  8. 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 Nausea following chemotherapy-improved with the addition of prophylactic Decadron  10. Skin rash-most likely secondary to Decadron, improved  11. History of neutropenia secondary to chemotherapy  12. 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 Nausea and diarrhea while treated with Xeloda. Improved.  15. Hand-foot syndrome 03/07/2013 manifesting with erythema, dry desquamation and pain on the palms and erythema and pain on the soles. Progressive hand pain 04/25/2013. Xeloda discontinued.  16. Anemia-microcytic; chronic, stable. Ferritin low at 6 11/21/2013. Oral iron initiated. 17. Delayed nausea following cycle 1 FOLFOX. Aloxi added beginning with cycle 2 04/10/2014.  18. Rash and pruritus during the oxaliplatin infusion with cycle 3 FOLFOX, improved with Benadryl, Cytomel, and Pepcid, no associated symptoms, no rash with cycle 4 FOLFOX 05/08/2014    Disposition: Joseph Salinas appears stable. He has completed 5 cycles of FOLFOX. Restaging CT evaluation shows improvement. Plan to proceed with cycle 6 FOLFOX today as scheduled. He will return for a follow-up visit and cycle 7 in 2 weeks. He will contact the office in the interim with any problems.  Patient seen with Dr. Benay Spice.    Ned Card ANP/GNP-BC   06/05/2014  9:45 AM

## 2014-06-05 NOTE — Telephone Encounter (Signed)
Gave avs & calendar for April. Sent message to schedule treatment. °

## 2014-06-05 NOTE — Patient Instructions (Signed)
[facility] Discharge Instructions for Patients Receiving Chemotherapy  Today you received the following chemotherapy agents;  Leucovorin, 5- FU and Oxaliplatin.   To help prevent nausea and vomiting after your treatment, we encourage you to take your nausea medication as directed.    If you develop nausea and vomiting that is not controlled by your nausea medication, call the clinic.   BELOW ARE SYMPTOMS THAT SHOULD BE REPORTED IMMEDIATELY:  *FEVER GREATER THAN 100.5 F  *CHILLS WITH OR WITHOUT FEVER  NAUSEA AND VOMITING THAT IS NOT CONTROLLED WITH YOUR NAUSEA MEDICATION  *UNUSUAL SHORTNESS OF BREATH  *UNUSUAL BRUISING OR BLEEDING  TENDERNESS IN MOUTH AND THROAT WITH OR WITHOUT PRESENCE OF ULCERS  *URINARY PROBLEMS  *BOWEL PROBLEMS  UNUSUAL RASH Items with * indicate a potential emergency and should be followed up as soon as possible.  Feel free to call the clinic you have any questions or concerns. The clinic phone number is [phone].  Please show the [facility] at check to the Emergency Department and triage nurse.

## 2014-06-07 ENCOUNTER — Ambulatory Visit (HOSPITAL_BASED_OUTPATIENT_CLINIC_OR_DEPARTMENT_OTHER): Payer: 59

## 2014-06-07 DIAGNOSIS — C169 Malignant neoplasm of stomach, unspecified: Secondary | ICD-10-CM

## 2014-06-07 DIAGNOSIS — Z452 Encounter for adjustment and management of vascular access device: Secondary | ICD-10-CM

## 2014-06-07 MED ORDER — HEPARIN SOD (PORK) LOCK FLUSH 100 UNIT/ML IV SOLN
500.0000 [IU] | Freq: Once | INTRAVENOUS | Status: AC | PRN
  Administered 2014-06-07: 500 [IU]
  Filled 2014-06-07: qty 5

## 2014-06-07 MED ORDER — SODIUM CHLORIDE 0.9 % IJ SOLN
10.0000 mL | INTRAMUSCULAR | Status: DC | PRN
  Administered 2014-06-07: 10 mL
  Filled 2014-06-07: qty 10

## 2014-06-07 NOTE — Patient Instructions (Signed)
Fluorouracil, 5FU; Diclofenac topical cream What is this medicine? FLUOROURACIL; DICLOFENAC (flure oh YOOR a sil; dye KLOE fen ak) is a combination of a topical chemotherapy agent and non-steroidal anti-inflammatory drug (NSAID). It is used on the skin to treat skin cancer and skin conditions that could become cancer. This medicine may be used for other purposes; ask your health care provider or pharmacist if you have questions. COMMON BRAND NAME(S): FLUORAC What should I tell my health care provider before I take this medicine? They need to know if you have any of these conditions: -bleeding problems -cigarette smoker -DPD enzyme deficiency -heart disease -high blood pressure -if you frequently drink alcohol containing drinks -kidney disease -liver disease -open or infected skin -stomach problems -swelling or open sores at the treatment site -recent or planned coronary artery bypass graft (CABG) surgery -an unusual or allergic reaction to fluorouracil, diclofenac, aspirin, other NSAIDs, other medicines, foods, dyes, or preservatives -pregnant or trying to get pregnant -breast-feeding How should I use this medicine? This medicine is only for use on the skin. Follow the directions on the prescription label. Wash hands before and after use. Wash affected area and gently pat dry. To apply this medicine use a cotton-tipped applicator, or use gloves if applying with fingertips. If applied with unprotected fingertips, it is very important to wash your hands well after you apply this medicine. Avoid applying to the eyes, nose, or mouth. Apply enough medicine to cover the affected area. You can cover the area with a light gauze dressing, but do not use tight or air-tight dressings. Finish the full course prescribed by your doctor or health care professional, even if you think your condition is better. Do not stop taking except on the advice of your doctor or health care professional. Talk to your  pediatrician regarding the use of this medicine in children. Special care may be needed. Overdosage: If you think you've taken too much of this medicine contact a poison control center or emergency room at once. Overdosage: If you think you have taken too much of this medicine contact a poison control center or emergency room at once. NOTE: This medicine is only for you. Do not share this medicine with others. What if I miss a dose? If you miss a dose, apply it as soon as you can. If it is almost time for your next dose, only use that dose. Do not apply extra doses. Contact your doctor or health care professional if you miss more than one dose. What may interact with this medicine? Interactions are not expected. Do not use any other skin products without telling your doctor or health care professional. This list may not describe all possible interactions. Give your health care provider a list of all the medicines, herbs, non-prescription drugs, or dietary supplements you use. Also tell them if you smoke, drink alcohol, or use illegal drugs. Some items may interact with your medicine. What should I watch for while using this medicine? Visit your doctor or health care professional for checks on your progress. You will need to use this medicine for 2 to 6 weeks. This may be longer depending on the condition being treated. You may not see full healing for another 1 to 2 months after you stop using the medicine. Treated areas of skin can look unsightly during and for several weeks after treatment with this medicine. This medicine can make you more sensitive to the sun. Keep out of the sun. If you cannot avoid being in   the sun, wear protective clothing and use sunscreen. Do not use sun lamps or tanning beds/booths. Where should I keep my What side effects may I notice from receiving this medicine? Side effects that you should report to your doctor or health care professional as soon as possible: -allergic  reactions like skin rash, itching or hives, swelling of the face, lips, or tongue -black or bloody stools, blood in the urine or vomit -blurred vision -chest pain -difficulty breathing or wheezing -redness, blistering, peeling or loosening of the skin, including inside the mouth -severe redness and swelling of normal skin -slurred speech or weakness on one side of the body -trouble passing urine or change in the amount of urine -unexplained weight gain or swelling -unusually weak or tired -yellowing of eyes or skin Side effects that usually do not require medical attention (Report these to your doctor or health care professional if they continue or are bothersome.): -increased sensitivity of the skin to sun and ultraviolet light -pain and burning of the affected area -scaling or swelling of the affected area -skin rash, itching of the affected area -tenderness This list may not describe all possible side effects. Call your doctor for medical advice about side effects. You may report side effects to FDA at 1-800-FDA-1088. Where should I keep my medicine? Keep out of the reach of children. Store at room temperature between 20 and 25 degrees C (68 and 77 degrees F). Throw away any unused medicine after the expiration date. NOTE: This sheet is a summary. It may not cover all possible information. If you have questions about this medicine, talk to your doctor, pharmacist, or health care provider.  2015, [employer]vier/Gold Standard. (2013-07-09 11:09:58)  

## 2014-06-16 ENCOUNTER — Other Ambulatory Visit: Payer: Self-pay | Admitting: Oncology

## 2014-06-19 ENCOUNTER — Telehealth: Payer: Self-pay | Admitting: *Deleted

## 2014-06-19 ENCOUNTER — Telehealth: Payer: Self-pay | Admitting: Oncology

## 2014-06-19 ENCOUNTER — Other Ambulatory Visit (HOSPITAL_BASED_OUTPATIENT_CLINIC_OR_DEPARTMENT_OTHER): Payer: 59

## 2014-06-19 ENCOUNTER — Ambulatory Visit (HOSPITAL_BASED_OUTPATIENT_CLINIC_OR_DEPARTMENT_OTHER): Payer: 59

## 2014-06-19 ENCOUNTER — Ambulatory Visit (HOSPITAL_BASED_OUTPATIENT_CLINIC_OR_DEPARTMENT_OTHER): Payer: 59 | Admitting: Oncology

## 2014-06-19 VITALS — BP 110/70 | HR 81 | Temp 98.2°F | Resp 18 | Ht 63.0 in | Wt 161.8 lb

## 2014-06-19 DIAGNOSIS — K5900 Constipation, unspecified: Secondary | ICD-10-CM

## 2014-06-19 DIAGNOSIS — Z5111 Encounter for antineoplastic chemotherapy: Secondary | ICD-10-CM | POA: Diagnosis not present

## 2014-06-19 DIAGNOSIS — D509 Iron deficiency anemia, unspecified: Secondary | ICD-10-CM | POA: Diagnosis not present

## 2014-06-19 DIAGNOSIS — R21 Rash and other nonspecific skin eruption: Secondary | ICD-10-CM | POA: Diagnosis not present

## 2014-06-19 DIAGNOSIS — C786 Secondary malignant neoplasm of retroperitoneum and peritoneum: Secondary | ICD-10-CM | POA: Diagnosis not present

## 2014-06-19 DIAGNOSIS — C169 Malignant neoplasm of stomach, unspecified: Secondary | ICD-10-CM | POA: Diagnosis not present

## 2014-06-19 DIAGNOSIS — R110 Nausea: Secondary | ICD-10-CM

## 2014-06-19 LAB — CBC WITH DIFFERENTIAL/PLATELET
BASO%: 2.3 % — ABNORMAL HIGH (ref 0.0–2.0)
Basophils Absolute: 0.1 10*3/uL (ref 0.0–0.1)
EOS ABS: 0.1 10*3/uL (ref 0.0–0.5)
EOS%: 2.8 % (ref 0.0–7.0)
HCT: 31.1 % — ABNORMAL LOW (ref 38.4–49.9)
HGB: 9.2 g/dL — ABNORMAL LOW (ref 13.0–17.1)
LYMPH%: 27.5 % (ref 14.0–49.0)
MCH: 21.4 pg — ABNORMAL LOW (ref 27.2–33.4)
MCHC: 29.8 g/dL — AB (ref 32.0–36.0)
MCV: 71.8 fL — AB (ref 79.3–98.0)
MONO#: 0.4 10*3/uL (ref 0.1–0.9)
MONO%: 12.2 % (ref 0.0–14.0)
NEUT#: 1.8 10*3/uL (ref 1.5–6.5)
NEUT%: 55.2 % (ref 39.0–75.0)
Platelets: 144 10*3/uL (ref 140–400)
RBC: 4.33 10*6/uL (ref 4.20–5.82)
RDW: 23.4 % — ABNORMAL HIGH (ref 11.0–14.6)
WBC: 3.2 10*3/uL — ABNORMAL LOW (ref 4.0–10.3)
lymph#: 0.9 10*3/uL (ref 0.9–3.3)

## 2014-06-19 LAB — COMPREHENSIVE METABOLIC PANEL (CC13)
ALBUMIN: 3.2 g/dL — AB (ref 3.5–5.0)
ALT: 13 U/L (ref 0–55)
AST: 14 U/L (ref 5–34)
Alkaline Phosphatase: 76 U/L (ref 40–150)
Anion Gap: 11 mEq/L (ref 3–11)
BUN: 12.3 mg/dL (ref 7.0–26.0)
CHLORIDE: 108 meq/L (ref 98–109)
CO2: 23 meq/L (ref 22–29)
CREATININE: 0.9 mg/dL (ref 0.7–1.3)
Calcium: 8.5 mg/dL (ref 8.4–10.4)
EGFR: >90 mL/min/{1.73_m2} (ref >90)
GLUCOSE: 140 mg/dL (ref 70–140)
Potassium: 4.1 mEq/L (ref 3.5–5.1)
Sodium: 142 mEq/L (ref 136–145)
TOTAL PROTEIN: 6.3 g/dL — AB (ref 6.4–8.3)
Total Bilirubin: 0.49 mg/dL (ref 0.20–1.20)

## 2014-06-19 MED ORDER — SODIUM CHLORIDE 0.9 % IV SOLN
1920.0000 mg/m2 | INTRAVENOUS | Status: DC
  Administered 2014-06-19: 3450 mg via INTRAVENOUS
  Filled 2014-06-19: qty 69

## 2014-06-19 MED ORDER — METHYLPREDNISOLONE SODIUM SUCC 125 MG IJ SOLR
INTRAMUSCULAR | Status: AC
  Filled 2014-06-19: qty 2

## 2014-06-19 MED ORDER — SODIUM CHLORIDE 0.9 % IV SOLN
150.0000 mg | Freq: Once | INTRAVENOUS | Status: AC
  Administered 2014-06-19: 150 mg via INTRAVENOUS
  Filled 2014-06-19: qty 5

## 2014-06-19 MED ORDER — PALONOSETRON HCL INJECTION 0.25 MG/5ML
0.2500 mg | Freq: Once | INTRAVENOUS | Status: AC
  Administered 2014-06-19: 0.25 mg via INTRAVENOUS

## 2014-06-19 MED ORDER — FAMOTIDINE IN NACL 20-0.9 MG/50ML-% IV SOLN
INTRAVENOUS | Status: AC
  Filled 2014-06-19: qty 50

## 2014-06-19 MED ORDER — PALONOSETRON HCL INJECTION 0.25 MG/5ML
INTRAVENOUS | Status: AC
  Filled 2014-06-19: qty 5

## 2014-06-19 MED ORDER — FLUOROURACIL CHEMO INJECTION 2.5 GM/50ML
300.0000 mg/m2 | Freq: Once | INTRAVENOUS | Status: AC
  Administered 2014-06-19: 550 mg via INTRAVENOUS
  Filled 2014-06-19: qty 11

## 2014-06-19 MED ORDER — DIPHENHYDRAMINE HCL 50 MG/ML IJ SOLN
INTRAMUSCULAR | Status: AC
  Filled 2014-06-19: qty 1

## 2014-06-19 MED ORDER — DEXTROSE 5 % IV SOLN
Freq: Once | INTRAVENOUS | Status: AC
  Administered 2014-06-19: 12:00:00 via INTRAVENOUS

## 2014-06-19 MED ORDER — OXALIPLATIN CHEMO INJECTION 100 MG/20ML
84.0000 mg/m2 | Freq: Once | INTRAVENOUS | Status: AC
  Administered 2014-06-19: 150 mg via INTRAVENOUS
  Filled 2014-06-19: qty 30

## 2014-06-19 MED ORDER — LEUCOVORIN CALCIUM INJECTION 350 MG
300.0000 mg/m2 | Freq: Once | INTRAVENOUS | Status: AC
  Administered 2014-06-19: 540 mg via INTRAVENOUS
  Filled 2014-06-19: qty 27

## 2014-06-19 MED ORDER — DIPHENHYDRAMINE HCL 50 MG/ML IJ SOLN
25.0000 mg | Freq: Once | INTRAMUSCULAR | Status: AC
  Administered 2014-06-19: 25 mg via INTRAVENOUS

## 2014-06-19 MED ORDER — METHYLPREDNISOLONE SODIUM SUCC 125 MG IJ SOLR
125.0000 mg | Freq: Once | INTRAMUSCULAR | Status: AC
  Administered 2014-06-19: 125 mg via INTRAVENOUS

## 2014-06-19 MED ORDER — FAMOTIDINE IN NACL 20-0.9 MG/50ML-% IV SOLN
20.0000 mg | Freq: Two times a day (BID) | INTRAVENOUS | Status: DC
  Administered 2014-06-19: 20 mg via INTRAVENOUS

## 2014-06-19 NOTE — Progress Notes (Signed)
[facility] OFFICE PROGRESS NOTE   Diagnosis:  Gastric cancer  INTERVAL HISTORY:    Joseph Salinas returns as scheduled. He feels well. No pain. Good appetite. He reports mild numbness at the "knuckles "bilaterally. This does not interfere with activity.  Objective:  Vital signs in last 24 hours:  Blood pressure 110/70, pulse 81, temperature 98.2 F (36.8 C), temperature source Oral, resp. rate 18, height 5' 3"  (1.6 m), weight 161 lb 12.8 oz (73.392 kg), SpO2 100 %.    HEENT:  No thrush or ulcers Resp:  Lungs clear bilaterally Cardio:  Regular rate and rhythm GI:  No hepatomegaly, nontender, no mass Vascular:  No leg edema Neuro: mild loss of vibratory sense at the fingertips bilaterally  Skin: mild hyperpigmented follicular rash over the trunk   Portacath/PICC-without erythema  Lab Results:  Lab Results  Component Value Date   WBC 3.2* 06/19/2014   HGB 9.2* 06/19/2014   HCT 31.1* 06/19/2014   MCV 71.8* 06/19/2014   PLT 144 06/19/2014   NEUTROABS 1.8 06/19/2014      Lab Results  Component Value Date   CEA 11.3* 06/05/2014    Medications: I have reviewed the patient's current medications.  Assessment/Plan: 1.Metastatic gastric cancer-status post biopsy of a gastric mass on 07/20/2012 with the pathology confirming adenocarcinoma, HER-2 non-amplified.  - CT 07/18/2012 consistent with a primary gastric mass and metastatic omental/peritoneal nodules  -biopsy of a peritoneal nodule 07/31/2012 consistent with metastatic adenocarcinoma  -Cycle 1 of FOLFOX on 08/02/2012  -Restaging CT 10/23/2012 consistent with a partial response-decrease in the primary gastric tumor and omental/peritoneal implants  -cycle 10 of FOLFOX completed 01/11/2013  -normal CEA 01/11/2013  -Restaging CT 01/23/2013 with improvement in the peritoneal metastases and an increased size of an indeterminate splenic lesion  -Initiation of maintenance Xeloda therapy on a 7 day on/7  day off schedule 01/29/2013.  -Xeloda dose reduced to 1000 mg twice daily 7 days on/7 days off beginning 02/26/2013 due to nausea and diarrhea.  -Xeloda dose reduced to 500 mg twice daily, 7 days/14 days off 03/20/2013.  -Xeloda discontinued 04/25/2013 due to progressive hand foot syndrome.  -CEA elevated at 9.1 on 04/25/2013.  -CEA stable at 9.5 on 05/09/2013.  -Initiation of infusional 5-FU as per the FOLFOX regimen 05/09/2013.  -CEA 7.4 on 05/24/2013.  -CEA 7.9 on 06/21/2013.  -CEA 9.3 on 08/22/2013 .  -CEA 11.1 on 10/01/2013.  -Restaging CT 10/01/2013 with persistent asymmetric gastric wall thickening in the greater curvature of the mid stomach. Dominant index gastrocolic lymph node decreased in size. Other smaller perigastric lymph nodes stable. No new or progressive findings in or around the stomach. Low-density subcapsular splenic lesion resolved.  -Continuation of infusional 5-fluorouracil, switched to a 4 week schedule beginning 01/02/2014 -CT 03/26/2014 with enlargement of the gastric mass and a possible new liver lesion -FOLFOX chemotherapy resumed 03/27/2014 -Restaging CT scan 06/03/2014 with mild decrease in volume of gastric wall mass. Decrease in volume of perigastric metastatic lymph node. Stable adrenal nodularity. No evidence of disease progression. -Cycle 6 FOLFOX 06/05/2014 - cycle 7 FOLFOX 06/19/2014 3. Multiple colon polyps documented on a colonoscopy 06/22/2012-tubular adenomas, tubulovillous adenomas, and hyperplastic polyps  4. Family history of colon cancer (mother). Status post genetics evaluation. Negative genetic testing on the Lynch/High Risk colon panel test  5. Pain secondary to the primary gastric mass or carcinomatosis -resolved  6. 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 Enrique Sack, multiple extractions were recommended  7. Anorexia,  nausea, and early satiety-improved  8. Constipation secondary to  narcotics and carcinomatosis-improved  9. Nausea following chemotherapy-improved with the addition of prophylactic Decadron  10. Skin rash-most likely secondary to Decadron, improved  11. History of neutropenia secondary to chemotherapy  12. 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 Nausea and diarrhea while treated with Xeloda. Improved.  15. Hand-foot syndrome 03/07/2013 manifesting with erythema, dry desquamation and pain on the palms and erythema and pain on the soles. Progressive hand pain 04/25/2013. Xeloda discontinued.  16. Anemia-microcytic; chronic, stable. Ferritin low at 6 11/21/2013. Oral iron initiated. 17. Delayed nausea following cycle 1 FOLFOX. Aloxi added beginning with cycle 2 04/10/2014.  18. Rash and pruritus during the oxaliplatin infusion with cycle 3 FOLFOX, improved with Benadryl, Solu-Medrol, and Pepcid, no associated symptoms, no rash with cycle 4 FOLFOX 05/08/2014     Disposition:   Mr. Hafley appears stable. He will compete another cycle of FOLFOX today. He will return for an office visit and chemotherapy in 2 weeks.  Betsy Coder, MD  06/19/2014  2:51 PM

## 2014-06-19 NOTE — Telephone Encounter (Signed)
Per staff message and POF I have scheduled appts. Advised scheduler of appts. JMW  

## 2014-06-19 NOTE — Telephone Encounter (Signed)
Gave avs & calendar for April. Sent message to schedule treatment. °

## 2014-06-19 NOTE — Patient Instructions (Signed)
[facility] Discharge Instructions for Patients Receiving Chemotherapy  Today you received the following chemotherapy agents Oxaliplatin, Leucovorin and 5FU.  To help prevent nausea and vomiting after your treatment, we encourage you to take your nausea medication as prescribed.   If you develop nausea and vomiting that is not controlled by your nausea medication, call the clinic.   BELOW ARE SYMPTOMS THAT SHOULD BE REPORTED IMMEDIATELY:  *FEVER GREATER THAN 100.5 F  *CHILLS WITH OR WITHOUT FEVER  NAUSEA AND VOMITING THAT IS NOT CONTROLLED WITH YOUR NAUSEA MEDICATION  *UNUSUAL SHORTNESS OF BREATH  *UNUSUAL BRUISING OR BLEEDING  TENDERNESS IN MOUTH AND THROAT WITH OR WITHOUT PRESENCE OF ULCERS  *URINARY PROBLEMS  *BOWEL PROBLEMS  UNUSUAL RASH Items with * indicate a potential emergency and should be followed up as soon as possible.  Feel free to call the clinic you have any questions or concerns. The clinic phone number is [phone].  Please show the [place] at check-in to the Emergency Department and triage nurse.

## 2014-06-21 ENCOUNTER — Ambulatory Visit (HOSPITAL_BASED_OUTPATIENT_CLINIC_OR_DEPARTMENT_OTHER): Payer: 59

## 2014-06-21 DIAGNOSIS — C786 Secondary malignant neoplasm of retroperitoneum and peritoneum: Secondary | ICD-10-CM

## 2014-06-21 DIAGNOSIS — C169 Malignant neoplasm of stomach, unspecified: Secondary | ICD-10-CM

## 2014-06-21 MED ORDER — SODIUM CHLORIDE 0.9 % IJ SOLN
10.0000 mL | INTRAMUSCULAR | Status: DC | PRN
  Administered 2014-06-21: 10 mL
  Filled 2014-06-21: qty 10

## 2014-06-21 MED ORDER — HEPARIN SOD (PORK) LOCK FLUSH 100 UNIT/ML IV SOLN
500.0000 [IU] | Freq: Once | INTRAVENOUS | Status: AC | PRN
  Administered 2014-06-21: 500 [IU]
  Filled 2014-06-21: qty 5

## 2014-06-21 NOTE — Patient Instructions (Signed)
Fluorouracil, 5FU; Diclofenac topical cream What is this medicine? FLUOROURACIL; DICLOFENAC (flure oh YOOR a sil; dye KLOE fen ak) is a combination of a topical chemotherapy agent and non-steroidal anti-inflammatory drug (NSAID). It is used on the skin to treat skin cancer and skin conditions that could become cancer. This medicine may be used for other purposes; ask your health care provider or pharmacist if you have questions. COMMON BRAND NAME(S): FLUORAC What should I tell my health care provider before I take this medicine? They need to know if you have any of these conditions: -bleeding problems -cigarette smoker -DPD enzyme deficiency -heart disease -high blood pressure -if you frequently drink alcohol containing drinks -kidney disease -liver disease -open or infected skin -stomach problems -swelling or open sores at the treatment site -recent or planned coronary artery bypass graft (CABG) surgery -an unusual or allergic reaction to fluorouracil, diclofenac, aspirin, other NSAIDs, other medicines, foods, dyes, or preservatives -pregnant or trying to get pregnant -breast-feeding How should I use this medicine? This medicine is only for use on the skin. Follow the directions on the prescription label. Wash hands before and after use. Wash affected area and gently pat dry. To apply this medicine use a cotton-tipped applicator, or use gloves if applying with fingertips. If applied with unprotected fingertips, it is very important to wash your hands well after you apply this medicine. Avoid applying to the eyes, nose, or mouth. Apply enough medicine to cover the affected area. You can cover the area with a light gauze dressing, but do not use tight or air-tight dressings. Finish the full course prescribed by your doctor or health care professional, even if you think your condition is better. Do not stop taking except on the advice of your doctor or health care professional. Talk to your  pediatrician regarding the use of this medicine in children. Special care may be needed. Overdosage: If you think you've taken too much of this medicine contact a poison control center or emergency room at once. Overdosage: If you think you have taken too much of this medicine contact a poison control center or emergency room at once. NOTE: This medicine is only for you. Do not share this medicine with others. What if I miss a dose? If you miss a dose, apply it as soon as you can. If it is almost time for your next dose, only use that dose. Do not apply extra doses. Contact your doctor or health care professional if you miss more than one dose. What may interact with this medicine? Interactions are not expected. Do not use any other skin products without telling your doctor or health care professional. This list may not describe all possible interactions. Give your health care provider a list of all the medicines, herbs, non-prescription drugs, or dietary supplements you use. Also tell them if you smoke, drink alcohol, or use illegal drugs. Some items may interact with your medicine. What should I watch for while using this medicine? Visit your doctor or health care professional for checks on your progress. You will need to use this medicine for 2 to 6 weeks. This may be longer depending on the condition being treated. You may not see full healing for another 1 to 2 months after you stop using the medicine. Treated areas of skin can look unsightly during and for several weeks after treatment with this medicine. This medicine can make you more sensitive to the sun. Keep out of the sun. If you cannot avoid being in   the sun, wear protective clothing and use sunscreen. Do not use sun lamps or tanning beds/booths. Where should I keep my What side effects may I notice from receiving this medicine? Side effects that you should report to your doctor or health care professional as soon as possible: -allergic  reactions like skin rash, itching or hives, swelling of the face, lips, or tongue -black or bloody stools, blood in the urine or vomit -blurred vision -chest pain -difficulty breathing or wheezing -redness, blistering, peeling or loosening of the skin, including inside the mouth -severe redness and swelling of normal skin -slurred speech or weakness on one side of the body -trouble passing urine or change in the amount of urine -unexplained weight gain or swelling -unusually weak or tired -yellowing of eyes or skin Side effects that usually do not require medical attention (Report these to your doctor or health care professional if they continue or are bothersome.): -increased sensitivity of the skin to sun and ultraviolet light -pain and burning of the affected area -scaling or swelling of the affected area -skin rash, itching of the affected area -tenderness This list may not describe all possible side effects. Call your doctor for medical advice about side effects. You may report side effects to FDA at 1-800-FDA-1088. Where should I keep my medicine? Keep out of the reach of children. Store at room temperature between 20 and 25 degrees C (68 and 77 degrees F). Throw away any unused medicine after the expiration date. NOTE: This sheet is a summary. It may not cover all possible information. If you have questions about this medicine, talk to your doctor, pharmacist, or health care provider.  2015, [employer]vier/Gold Standard. (2013-07-09 11:09:58)  

## 2014-06-30 ENCOUNTER — Other Ambulatory Visit: Payer: Self-pay | Admitting: Oncology

## 2014-07-03 ENCOUNTER — Ambulatory Visit (HOSPITAL_BASED_OUTPATIENT_CLINIC_OR_DEPARTMENT_OTHER): Payer: 59

## 2014-07-03 ENCOUNTER — Other Ambulatory Visit (HOSPITAL_BASED_OUTPATIENT_CLINIC_OR_DEPARTMENT_OTHER): Payer: 59

## 2014-07-03 ENCOUNTER — Telehealth: Payer: Self-pay | Admitting: Oncology

## 2014-07-03 ENCOUNTER — Ambulatory Visit (HOSPITAL_BASED_OUTPATIENT_CLINIC_OR_DEPARTMENT_OTHER): Payer: 59 | Admitting: Nurse Practitioner

## 2014-07-03 VITALS — BP 126/77 | HR 70 | Temp 98.0°F | Resp 20 | Ht 63.0 in | Wt 161.2 lb

## 2014-07-03 DIAGNOSIS — Z5111 Encounter for antineoplastic chemotherapy: Secondary | ICD-10-CM | POA: Diagnosis not present

## 2014-07-03 DIAGNOSIS — D509 Iron deficiency anemia, unspecified: Secondary | ICD-10-CM | POA: Diagnosis not present

## 2014-07-03 DIAGNOSIS — C786 Secondary malignant neoplasm of retroperitoneum and peritoneum: Secondary | ICD-10-CM

## 2014-07-03 DIAGNOSIS — C169 Malignant neoplasm of stomach, unspecified: Secondary | ICD-10-CM

## 2014-07-03 LAB — COMPREHENSIVE METABOLIC PANEL (CC13)
ALT: 11 U/L (ref 0–55)
ANION GAP: 9 meq/L (ref 3–11)
AST: 16 U/L (ref 5–34)
Albumin: 3.3 g/dL — ABNORMAL LOW (ref 3.5–5.0)
Alkaline Phosphatase: 84 U/L (ref 40–150)
BILIRUBIN TOTAL: 0.42 mg/dL (ref 0.20–1.20)
BUN: 7.8 mg/dL (ref 7.0–26.0)
CO2: 26 meq/L (ref 22–29)
CREATININE: 0.8 mg/dL (ref 0.7–1.3)
Calcium: 8.3 mg/dL — ABNORMAL LOW (ref 8.4–10.4)
Chloride: 108 mEq/L (ref 98–109)
GLUCOSE: 107 mg/dL (ref 70–140)
Potassium: 4 mEq/L (ref 3.5–5.1)
Sodium: 144 mEq/L (ref 136–145)
Total Protein: 6.5 g/dL (ref 6.4–8.3)

## 2014-07-03 LAB — CBC WITH DIFFERENTIAL/PLATELET
BASO%: 2 % (ref 0.0–2.0)
BASOS ABS: 0.1 10*3/uL (ref 0.0–0.1)
EOS%: 3.1 % (ref 0.0–7.0)
Eosinophils Absolute: 0.1 10*3/uL (ref 0.0–0.5)
HEMATOCRIT: 30.8 % — AB (ref 38.4–49.9)
HEMOGLOBIN: 9.3 g/dL — AB (ref 13.0–17.1)
LYMPH%: 28.9 % (ref 14.0–49.0)
MCH: 22 pg — ABNORMAL LOW (ref 27.2–33.4)
MCHC: 30.3 g/dL — ABNORMAL LOW (ref 32.0–36.0)
MCV: 72.5 fL — ABNORMAL LOW (ref 79.3–98.0)
MONO#: 0.5 10*3/uL (ref 0.1–0.9)
MONO%: 15.6 % — ABNORMAL HIGH (ref 0.0–14.0)
NEUT#: 1.7 10*3/uL (ref 1.5–6.5)
NEUT%: 50.4 % (ref 39.0–75.0)
PLATELETS: 148 10*3/uL (ref 140–400)
RBC: 4.25 10*6/uL (ref 4.20–5.82)
RDW: 23.9 % — ABNORMAL HIGH (ref 11.0–14.6)
WBC: 3.4 10*3/uL — AB (ref 4.0–10.3)
lymph#: 1 10*3/uL (ref 0.9–3.3)

## 2014-07-03 LAB — CEA: CEA: 11 ng/mL — ABNORMAL HIGH (ref 0.0–5.0)

## 2014-07-03 MED ORDER — OXALIPLATIN CHEMO INJECTION 100 MG/20ML
84.0000 mg/m2 | Freq: Once | INTRAVENOUS | Status: AC
  Administered 2014-07-03: 150 mg via INTRAVENOUS
  Filled 2014-07-03: qty 30

## 2014-07-03 MED ORDER — PALONOSETRON HCL INJECTION 0.25 MG/5ML
INTRAVENOUS | Status: AC
Start: 2014-07-03 — End: 2014-07-03
  Filled 2014-07-03: qty 5

## 2014-07-03 MED ORDER — FLUOROURACIL CHEMO INJECTION 2.5 GM/50ML
300.0000 mg/m2 | Freq: Once | INTRAVENOUS | Status: AC
Start: 2014-07-03 — End: 2014-07-03
  Administered 2014-07-03: 550 mg via INTRAVENOUS
  Filled 2014-07-03: qty 11

## 2014-07-03 MED ORDER — FAMOTIDINE IN NACL 20-0.9 MG/50ML-% IV SOLN
20.0000 mg | Freq: Two times a day (BID) | INTRAVENOUS | Status: DC
  Administered 2014-07-03: 20 mg via INTRAVENOUS

## 2014-07-03 MED ORDER — FAMOTIDINE IN NACL 20-0.9 MG/50ML-% IV SOLN
INTRAVENOUS | Status: AC
  Filled 2014-07-03: qty 50

## 2014-07-03 MED ORDER — PALONOSETRON HCL INJECTION 0.25 MG/5ML
0.2500 mg | Freq: Once | INTRAVENOUS | Status: AC
  Administered 2014-07-03: 0.25 mg via INTRAVENOUS

## 2014-07-03 MED ORDER — DEXTROSE 5 % IV SOLN
Freq: Once | INTRAVENOUS | Status: AC
  Administered 2014-07-03: 11:00:00 via INTRAVENOUS

## 2014-07-03 MED ORDER — METHYLPREDNISOLONE SODIUM SUCC 125 MG IJ SOLR
125.0000 mg | Freq: Once | INTRAMUSCULAR | Status: AC
  Administered 2014-07-03: 125 mg via INTRAVENOUS

## 2014-07-03 MED ORDER — DIPHENHYDRAMINE HCL 50 MG/ML IJ SOLN
INTRAMUSCULAR | Status: AC
  Filled 2014-07-03: qty 1

## 2014-07-03 MED ORDER — SODIUM CHLORIDE 0.9 % IV SOLN
1920.0000 mg/m2 | INTRAVENOUS | Status: DC
  Administered 2014-07-03: 3450 mg via INTRAVENOUS
  Filled 2014-07-03: qty 69

## 2014-07-03 MED ORDER — DIPHENHYDRAMINE HCL 50 MG/ML IJ SOLN
25.0000 mg | Freq: Once | INTRAMUSCULAR | Status: AC
  Administered 2014-07-03: 25 mg via INTRAVENOUS

## 2014-07-03 MED ORDER — METHYLPREDNISOLONE SODIUM SUCC 125 MG IJ SOLR
INTRAMUSCULAR | Status: AC
  Filled 2014-07-03: qty 2

## 2014-07-03 MED ORDER — LEUCOVORIN CALCIUM INJECTION 350 MG
300.0000 mg/m2 | Freq: Once | INTRAVENOUS | Status: AC
  Administered 2014-07-03: 540 mg via INTRAVENOUS
  Filled 2014-07-03: qty 27

## 2014-07-03 MED ORDER — FOSAPREPITANT DIMEGLUMINE INJECTION 150 MG
150.0000 mg | Freq: Once | INTRAVENOUS | Status: AC
  Administered 2014-07-03: 150 mg via INTRAVENOUS
  Filled 2014-07-03: qty 5

## 2014-07-03 NOTE — Progress Notes (Signed)
[facility] OFFICE PROGRESS NOTE   Diagnosis:  Gastric cancer  INTERVAL HISTORY:   Joseph Salinas returns as scheduled. He completed cycle 7 FOLFOX on 06/19/2014. He denies nausea/vomiting. He developed a single lip sore which has resolved. No diarrhea. No numbness or tingling in his hands or feet. No dysphagia. No pain. He continues to have a good appetite. He notes that the knuckles feel "tight" when he bends his fingers.  Objective:  Vital signs in last 24 hours:  Blood pressure 126/77, pulse 70, temperature 98 F (36.7 C), temperature source Oral, resp. rate 20, height 5' 3"  (1.6 m), weight 161 lb 3.2 oz (73.12 kg), SpO2 99 %.    HEENT: No thrush or ulcers. Resp: Lungs clear bilaterally. Cardio: Regular rate and rhythm. GI: Abdomen soft and nontender. No hepatomegaly. No mass. Vascular: No leg edema. Calves nontender. Neuro: Vibratory sense intact over the fingertips per tuning fork exam.  Skin: Mild acne-like rash scattered over the trunk.  Port-A-Cath without erythema.   Lab Results:  Lab Results  Component Value Date   WBC 3.4* 07/03/2014   HGB 9.3* 07/03/2014   HCT 30.8* 07/03/2014   MCV 72.5* 07/03/2014   PLT 148 07/03/2014   NEUTROABS 1.7 07/03/2014    Imaging:  No results found.  Medications: I have reviewed the patient's current medications.  Assessment/Plan: 1.Metastatic gastric cancer-status post biopsy of a gastric mass on 07/20/2012 with the pathology confirming adenocarcinoma, HER-2 non-amplified.  - CT 07/18/2012 consistent with a primary gastric mass and metastatic omental/peritoneal nodules  -biopsy of a peritoneal nodule 07/31/2012 consistent with metastatic adenocarcinoma  -Cycle 1 of FOLFOX on 08/02/2012  -Restaging CT 10/23/2012 consistent with a partial response-decrease in the primary gastric tumor and omental/peritoneal implants  -cycle 10 of FOLFOX completed 01/11/2013  -normal CEA 01/11/2013  -Restaging CT  01/23/2013 with improvement in the peritoneal metastases and an increased size of an indeterminate splenic lesion  -Initiation of maintenance Xeloda therapy on a 7 day on/7 day off schedule 01/29/2013.  -Xeloda dose reduced to 1000 mg twice daily 7 days on/7 days off beginning 02/26/2013 due to nausea and diarrhea.  -Xeloda dose reduced to 500 mg twice daily, 7 days/14 days off 03/20/2013.  -Xeloda discontinued 04/25/2013 due to progressive hand foot syndrome.  -CEA elevated at 9.1 on 04/25/2013.  -CEA stable at 9.5 on 05/09/2013.  -Initiation of infusional 5-FU as per the FOLFOX regimen 05/09/2013.  -CEA 7.4 on 05/24/2013.  -CEA 7.9 on 06/21/2013.  -CEA 9.3 on 08/22/2013 .  -CEA 11.1 on 10/01/2013.  -Restaging CT 10/01/2013 with persistent asymmetric gastric wall thickening in the greater curvature of the mid stomach. Dominant index gastrocolic lymph node decreased in size. Other smaller perigastric lymph nodes stable. No new or progressive findings in or around the stomach. Low-density subcapsular splenic lesion resolved.  -Continuation of infusional 5-fluorouracil, switched to a 4 week schedule beginning 01/02/2014 -CT 03/26/2014 with enlargement of the gastric mass and a possible new liver lesion -FOLFOX chemotherapy resumed 03/27/2014 -Restaging CT scan 06/03/2014 with mild decrease in volume of gastric wall mass. Decrease in volume of perigastric metastatic lymph node. Stable adrenal nodularity. No evidence of disease progression. -Cycle 6 FOLFOX 06/05/2014 - cycle 7 FOLFOX 06/19/2014 -Cycle 8 FOLFOX 07/03/2014 3. Multiple colon polyps documented on a colonoscopy 06/22/2012-tubular adenomas, tubulovillous adenomas, and hyperplastic polyps  4. Family history of colon cancer (mother). Status post genetics evaluation. Negative genetic testing on the Lynch/High Risk colon panel test  5. Pain secondary to the  primary gastric mass or carcinomatosis -resolved  6. 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 Enrique Sack, multiple extractions were recommended  7. Anorexia, nausea, and early satiety-improved  8. 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 Nausea following chemotherapy-improved with the addition of prophylactic Decadron  10. Skin rash-most likely secondary to Decadron, improved  11. History of neutropenia secondary to chemotherapy  12. 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 Nausea and diarrhea while treated with Xeloda. Improved.  15. Hand-foot syndrome 03/07/2013 manifesting with erythema, dry desquamation and pain on the palms and erythema and pain on the soles. Progressive hand pain 04/25/2013. Xeloda discontinued.  16. Anemia-microcytic; chronic, stable. Ferritin low at 6 11/21/2013. Oral iron initiated. 17. Delayed nausea following cycle 1 FOLFOX. Aloxi added beginning with cycle 2 04/10/2014.  18. Rash and pruritus during the oxaliplatin infusion with cycle 3 FOLFOX, improved with Benadryl, Solu-Medrol, and Pepcid, no associated symptoms, no rash with cycle 4 FOLFOX 05/08/2014    Disposition: Joseph Salinas appears stable. He has completed 7 cycles of FOLFOX. Plan to proceed with cycle 8 today as scheduled. He will return for a follow-up visit and cycle 9 in 2 weeks. He will contact the office in the interim with any problems.    Ned Card ANP/GNP-BC   07/03/2014  9:49 AM

## 2014-07-03 NOTE — Telephone Encounter (Signed)
gave adn printed appt sched and avs fo rpt for April and May....sed added tx.

## 2014-07-03 NOTE — Patient Instructions (Signed)
[facility] Discharge Instructions for Patients Receiving Chemotherapy  Today you received the following chemotherapy agents: Oxaliplatin, Leucovorin, Adrucil.   To help prevent nausea and vomiting after your treatment, we encourage you to take your nausea medication as directed.    If you develop nausea and vomiting that is not controlled by your nausea medication, call the clinic.   BELOW ARE SYMPTOMS THAT SHOULD BE REPORTED IMMEDIATELY:  *FEVER GREATER THAN 100.5 F  *CHILLS WITH OR WITHOUT FEVER  NAUSEA AND VOMITING THAT IS NOT CONTROLLED WITH YOUR NAUSEA MEDICATION  *UNUSUAL SHORTNESS OF BREATH  *UNUSUAL BRUISING OR BLEEDING  TENDERNESS IN MOUTH AND THROAT WITH OR WITHOUT PRESENCE OF ULCERS  *URINARY PROBLEMS  *BOWEL PROBLEMS  UNUSUAL RASH Items with * indicate a potential emergency and should be followed up as soon as possible.  Feel free to call the clinic you have any questions or concerns. The clinic phone number is [phone].  Please show the CHEMO ALERT CARD at check-in to the Emergency Department and triage nurse.   

## 2014-07-05 ENCOUNTER — Ambulatory Visit (HOSPITAL_BASED_OUTPATIENT_CLINIC_OR_DEPARTMENT_OTHER): Payer: 59

## 2014-07-05 VITALS — BP 124/54 | HR 86 | Temp 98.6°F

## 2014-07-05 DIAGNOSIS — C169 Malignant neoplasm of stomach, unspecified: Secondary | ICD-10-CM | POA: Diagnosis not present

## 2014-07-05 DIAGNOSIS — Z452 Encounter for adjustment and management of vascular access device: Secondary | ICD-10-CM | POA: Diagnosis not present

## 2014-07-05 MED ORDER — HEPARIN SOD (PORK) LOCK FLUSH 100 UNIT/ML IV SOLN
500.0000 [IU] | Freq: Once | INTRAVENOUS | Status: AC | PRN
  Administered 2014-07-05: 500 [IU]
  Filled 2014-07-05: qty 5

## 2014-07-05 MED ORDER — SODIUM CHLORIDE 0.9 % IJ SOLN
10.0000 mL | INTRAMUSCULAR | Status: DC | PRN
  Administered 2014-07-05: 10 mL
  Filled 2014-07-05: qty 10

## 2014-07-05 NOTE — Patient Instructions (Signed)
Fluorouracil, 5FU; Diclofenac topical cream What is this medicine? FLUOROURACIL; DICLOFENAC (flure oh YOOR a sil; dye KLOE fen ak) is a combination of a topical chemotherapy agent and non-steroidal anti-inflammatory drug (NSAID). It is used on the skin to treat skin cancer and skin conditions that could become cancer. This medicine may be used for other purposes; ask your health care provider or pharmacist if you have questions. COMMON BRAND NAME(S): FLUORAC What should I tell my health care provider before I take this medicine? They need to know if you have any of these conditions: -bleeding problems -cigarette smoker -DPD enzyme deficiency -heart disease -high blood pressure -if you frequently drink alcohol containing drinks -kidney disease -liver disease -open or infected skin -stomach problems -swelling or open sores at the treatment site -recent or planned coronary artery bypass graft (CABG) surgery -an unusual or allergic reaction to fluorouracil, diclofenac, aspirin, other NSAIDs, other medicines, foods, dyes, or preservatives -pregnant or trying to get pregnant -breast-feeding How should I use this medicine? This medicine is only for use on the skin. Follow the directions on the prescription label. Wash hands before and after use. Wash affected area and gently pat dry. To apply this medicine use a cotton-tipped applicator, or use gloves if applying with fingertips. If applied with unprotected fingertips, it is very important to wash your hands well after you apply this medicine. Avoid applying to the eyes, nose, or mouth. Apply enough medicine to cover the affected area. You can cover the area with a light gauze dressing, but do not use tight or air-tight dressings. Finish the full course prescribed by your doctor or health care professional, even if you think your condition is better. Do not stop taking except on the advice of your doctor or health care professional. Talk to your  pediatrician regarding the use of this medicine in children. Special care may be needed. Overdosage: If you think you've taken too much of this medicine contact a poison control center or emergency room at once. Overdosage: If you think you have taken too much of this medicine contact a poison control center or emergency room at once. NOTE: This medicine is only for you. Do not share this medicine with others. What if I miss a dose? If you miss a dose, apply it as soon as you can. If it is almost time for your next dose, only use that dose. Do not apply extra doses. Contact your doctor or health care professional if you miss more than one dose. What may interact with this medicine? Interactions are not expected. Do not use any other skin products without telling your doctor or health care professional. This list may not describe all possible interactions. Give your health care provider a list of all the medicines, herbs, non-prescription drugs, or dietary supplements you use. Also tell them if you smoke, drink alcohol, or use illegal drugs. Some items may interact with your medicine. What should I watch for while using this medicine? Visit your doctor or health care professional for checks on your progress. You will need to use this medicine for 2 to 6 weeks. This may be longer depending on the condition being treated. You may not see full healing for another 1 to 2 months after you stop using the medicine. Treated areas of skin can look unsightly during and for several weeks after treatment with this medicine. This medicine can make you more sensitive to the sun. Keep out of the sun. If you cannot avoid being in   the sun, wear protective clothing and use sunscreen. Do not use sun lamps or tanning beds/booths. Where should I keep my What side effects may I notice from receiving this medicine? Side effects that you should report to your doctor or health care professional as soon as possible: -allergic  reactions like skin rash, itching or hives, swelling of the face, lips, or tongue -black or bloody stools, blood in the urine or vomit -blurred vision -chest pain -difficulty breathing or wheezing -redness, blistering, peeling or loosening of the skin, including inside the mouth -severe redness and swelling of normal skin -slurred speech or weakness on one side of the body -trouble passing urine or change in the amount of urine -unexplained weight gain or swelling -unusually weak or tired -yellowing of eyes or skin Side effects that usually do not require medical attention (Report these to your doctor or health care professional if they continue or are bothersome.): -increased sensitivity of the skin to sun and ultraviolet light -pain and burning of the affected area -scaling or swelling of the affected area -skin rash, itching of the affected area -tenderness This list may not describe all possible side effects. Call your doctor for medical advice about side effects. You may report side effects to FDA at 1-800-FDA-1088. Where should I keep my medicine? Keep out of the reach of children. Store at room temperature between 20 and 25 degrees C (68 and 77 degrees F). Throw away any unused medicine after the expiration date. NOTE: This sheet is a summary. It may not cover all possible information. If you have questions about this medicine, talk to your doctor, pharmacist, or health care provider.  2015, [employer]vier/Gold Standard. (2013-07-09 11:09:58)  

## 2014-07-14 ENCOUNTER — Other Ambulatory Visit: Payer: Self-pay | Admitting: Oncology

## 2014-07-17 ENCOUNTER — Other Ambulatory Visit (HOSPITAL_BASED_OUTPATIENT_CLINIC_OR_DEPARTMENT_OTHER): Payer: 59

## 2014-07-17 ENCOUNTER — Ambulatory Visit (HOSPITAL_BASED_OUTPATIENT_CLINIC_OR_DEPARTMENT_OTHER): Payer: 59

## 2014-07-17 ENCOUNTER — Ambulatory Visit (HOSPITAL_BASED_OUTPATIENT_CLINIC_OR_DEPARTMENT_OTHER): Payer: 59 | Admitting: Oncology

## 2014-07-17 VITALS — BP 110/73 | HR 83 | Temp 98.2°F | Resp 18 | Ht 63.0 in | Wt 160.8 lb

## 2014-07-17 DIAGNOSIS — Z5111 Encounter for antineoplastic chemotherapy: Secondary | ICD-10-CM

## 2014-07-17 DIAGNOSIS — C786 Secondary malignant neoplasm of retroperitoneum and peritoneum: Secondary | ICD-10-CM

## 2014-07-17 DIAGNOSIS — C169 Malignant neoplasm of stomach, unspecified: Secondary | ICD-10-CM

## 2014-07-17 DIAGNOSIS — D509 Iron deficiency anemia, unspecified: Secondary | ICD-10-CM | POA: Diagnosis not present

## 2014-07-17 LAB — CBC WITH DIFFERENTIAL/PLATELET
BASO%: 1 % (ref 0.0–2.0)
Basophils Absolute: 0 10*3/uL (ref 0.0–0.1)
EOS%: 2.2 % (ref 0.0–7.0)
Eosinophils Absolute: 0.1 10*3/uL (ref 0.0–0.5)
HCT: 30.1 % — ABNORMAL LOW (ref 38.4–49.9)
HGB: 9 g/dL — ABNORMAL LOW (ref 13.0–17.1)
LYMPH%: 32.4 % (ref 14.0–49.0)
MCH: 22.6 pg — ABNORMAL LOW (ref 27.2–33.4)
MCHC: 29.9 g/dL — ABNORMAL LOW (ref 32.0–36.0)
MCV: 75.6 fL — ABNORMAL LOW (ref 79.3–98.0)
MONO#: 0.4 10*3/uL (ref 0.1–0.9)
MONO%: 13 % (ref 0.0–14.0)
NEUT%: 51.4 % (ref 39.0–75.0)
NEUTROS ABS: 1.6 10*3/uL (ref 1.5–6.5)
NRBC: 0 % (ref 0–0)
Platelets: 127 10*3/uL — ABNORMAL LOW (ref 140–400)
RBC: 3.98 10*6/uL — AB (ref 4.20–5.82)
RDW: 22.2 % — AB (ref 11.0–14.6)
WBC: 3.2 10*3/uL — AB (ref 4.0–10.3)
lymph#: 1 10*3/uL (ref 0.9–3.3)

## 2014-07-17 LAB — COMPREHENSIVE METABOLIC PANEL (CC13)
ALT: 10 U/L (ref 0–55)
AST: 15 U/L (ref 5–34)
Albumin: 3.3 g/dL — ABNORMAL LOW (ref 3.5–5.0)
Alkaline Phosphatase: 77 U/L (ref 40–150)
Anion Gap: 10 mEq/L (ref 3–11)
BUN: 10.6 mg/dL (ref 7.0–26.0)
CALCIUM: 8.3 mg/dL — AB (ref 8.4–10.4)
CO2: 22 meq/L (ref 22–29)
CREATININE: 0.8 mg/dL (ref 0.7–1.3)
Chloride: 110 mEq/L — ABNORMAL HIGH (ref 98–109)
EGFR: >90 mL/min/{1.73_m2} (ref >90)
Glucose: 112 mg/dl (ref 70–140)
Potassium: 3.7 mEq/L (ref 3.5–5.1)
Sodium: 141 mEq/L (ref 136–145)
Total Bilirubin: 0.4 mg/dL (ref 0.20–1.20)
Total Protein: 6.3 g/dL — ABNORMAL LOW (ref 6.4–8.3)

## 2014-07-17 MED ORDER — DIPHENHYDRAMINE HCL 50 MG/ML IJ SOLN
25.0000 mg | Freq: Once | INTRAMUSCULAR | Status: AC
  Administered 2014-07-17: 25 mg via INTRAVENOUS

## 2014-07-17 MED ORDER — HEPARIN SOD (PORK) LOCK FLUSH 100 UNIT/ML IV SOLN
500.0000 [IU] | Freq: Once | INTRAVENOUS | Status: DC | PRN
  Filled 2014-07-17: qty 5

## 2014-07-17 MED ORDER — FAMOTIDINE IN NACL 20-0.9 MG/50ML-% IV SOLN
INTRAVENOUS | Status: AC
  Filled 2014-07-17: qty 50

## 2014-07-17 MED ORDER — METHYLPREDNISOLONE SODIUM SUCC 125 MG IJ SOLR
125.0000 mg | Freq: Once | INTRAMUSCULAR | Status: AC
  Administered 2014-07-17: 125 mg via INTRAVENOUS

## 2014-07-17 MED ORDER — OXALIPLATIN CHEMO INJECTION 100 MG/20ML
83.0000 mg/m2 | Freq: Once | INTRAVENOUS | Status: AC
  Administered 2014-07-17: 150 mg via INTRAVENOUS
  Filled 2014-07-17: qty 30

## 2014-07-17 MED ORDER — SODIUM CHLORIDE 0.9 % IJ SOLN
10.0000 mL | INTRAMUSCULAR | Status: DC | PRN
  Filled 2014-07-17: qty 10

## 2014-07-17 MED ORDER — DIPHENHYDRAMINE HCL 50 MG/ML IJ SOLN
INTRAMUSCULAR | Status: AC
  Filled 2014-07-17: qty 1

## 2014-07-17 MED ORDER — SODIUM CHLORIDE 0.9 % IV SOLN
150.0000 mg | Freq: Once | INTRAVENOUS | Status: AC
  Administered 2014-07-17: 150 mg via INTRAVENOUS
  Filled 2014-07-17: qty 5

## 2014-07-17 MED ORDER — SODIUM CHLORIDE 0.9 % IV SOLN
Freq: Once | INTRAVENOUS | Status: AC
  Administered 2014-07-17: 10:00:00 via INTRAVENOUS

## 2014-07-17 MED ORDER — DEXTROSE 5 % IV SOLN
300.0000 mg/m2 | Freq: Once | INTRAVENOUS | Status: AC
  Administered 2014-07-17: 540 mg via INTRAVENOUS
  Filled 2014-07-17: qty 27

## 2014-07-17 MED ORDER — FAMOTIDINE IN NACL 20-0.9 MG/50ML-% IV SOLN
20.0000 mg | Freq: Two times a day (BID) | INTRAVENOUS | Status: DC
  Administered 2014-07-17: 20 mg via INTRAVENOUS

## 2014-07-17 MED ORDER — PALONOSETRON HCL INJECTION 0.25 MG/5ML
INTRAVENOUS | Status: AC
  Filled 2014-07-17: qty 5

## 2014-07-17 MED ORDER — SODIUM CHLORIDE 0.9 % IV SOLN
1920.0000 mg/m2 | INTRAVENOUS | Status: DC
  Administered 2014-07-17: 3450 mg via INTRAVENOUS
  Filled 2014-07-17: qty 69

## 2014-07-17 MED ORDER — FLUOROURACIL CHEMO INJECTION 2.5 GM/50ML
300.0000 mg/m2 | Freq: Once | INTRAVENOUS | Status: AC
  Administered 2014-07-17: 550 mg via INTRAVENOUS
  Filled 2014-07-17: qty 11

## 2014-07-17 MED ORDER — PALONOSETRON HCL INJECTION 0.25 MG/5ML
0.2500 mg | Freq: Once | INTRAVENOUS | Status: AC
  Administered 2014-07-17: 0.25 mg via INTRAVENOUS

## 2014-07-17 MED ORDER — METHYLPREDNISOLONE SODIUM SUCC 125 MG IJ SOLR
INTRAMUSCULAR | Status: AC
  Filled 2014-07-17: qty 2

## 2014-07-17 MED ORDER — DEXTROSE 5 % IV SOLN
Freq: Once | INTRAVENOUS | Status: AC
  Administered 2014-07-17: 11:00:00 via INTRAVENOUS

## 2014-07-17 NOTE — Progress Notes (Signed)
[place] OFFICE PROGRESS NOTE   Diagnosis: Gastric cancer  INTERVAL HISTORY:   Joseph Salinas returns as scheduled. He completed another cycle of FOLFOX 07/03/2014. No nausea, mouth sores, or diarrhea. No hand or foot pain. He reports cold sensitivity lasting 3-4 days after chemotherapy. No neuropathy symptoms at present. Good appetite and energy level.  Objective:  Vital signs in last 24 hours:  Blood pressure 110/73, pulse 83, temperature 98.2 F (36.8 C), temperature source Oral, resp. rate 18, height 5' 3" (1.6 m), weight 160 lb 12.8 oz (72.938 kg), SpO2 100 %.    HEENT: Hyperpigmentation of the buccal mucosa, no thrush or ulcers Resp: Lungs clear bilaterally Cardio: Regular rate and rhythm GI: No hepatosplenomegaly, nontender, no mass Vascular: No leg edema Neuro: The vibratory sense is intact at the fingertips bilaterally  Skin: Hyperpigmentation of the hands   Portacath/PICC-without erythema  Lab Results:  Lab Results  Component Value Date   WBC 3.2* 07/17/2014   HGB 9.0* 07/17/2014   HCT 30.1* 07/17/2014   MCV 75.6* 07/17/2014   PLT 127* 07/17/2014   NEUTROABS 1.6 07/17/2014     Lab Results  Component Value Date   CEA 11.0* 07/03/2014    Medications: I have reviewed the patient's current medications.  Assessment/Plan: 1.Metastatic gastric cancer-status post biopsy of a gastric mass on 07/20/2012 with the pathology confirming adenocarcinoma, HER-2 non-amplified.  - CT 07/18/2012 consistent with a primary gastric mass and metastatic omental/peritoneal nodules  -biopsy of a peritoneal nodule 07/31/2012 consistent with metastatic adenocarcinoma  -Cycle 1 of FOLFOX on 08/02/2012  -Restaging CT 10/23/2012 consistent with a partial response-decrease in the primary gastric tumor and omental/peritoneal implants  -cycle 10 of FOLFOX completed 01/11/2013  -normal CEA 01/11/2013  -Restaging CT 01/23/2013 with improvement in the peritoneal  metastases and an increased size of an indeterminate splenic lesion  -Initiation of maintenance Xeloda therapy on a 7 day on/7 day off schedule 01/29/2013.  -Xeloda dose reduced to 1000 mg twice daily 7 days on/7 days off beginning 02/26/2013 due to nausea and diarrhea.  -Xeloda dose reduced to 500 mg twice daily, 7 days/14 days off 03/20/2013.  -Xeloda discontinued 04/25/2013 due to progressive hand foot syndrome.  -CEA elevated at 9.1 on 04/25/2013.  -CEA stable at 9.5 on 05/09/2013.  -Initiation of infusional 5-FU as per the FOLFOX regimen 05/09/2013.  -CEA 7.4 on 05/24/2013.  -CEA 7.9 on 06/21/2013.  -CEA 9.3 on 08/22/2013 .  -CEA 11.1 on 10/01/2013.  -Restaging CT 10/01/2013 with persistent asymmetric gastric wall thickening in the greater curvature of the mid stomach. Dominant index gastrocolic lymph node decreased in size. Other smaller perigastric lymph nodes stable. No new or progressive findings in or around the stomach. Low-density subcapsular splenic lesion resolved.  -Continuation of infusional 5-fluorouracil, switched to a 4 week schedule beginning 01/02/2014 -CT 03/26/2014 with enlargement of the gastric mass and a possible new liver lesion -FOLFOX chemotherapy resumed 03/27/2014 -Restaging CT scan 06/03/2014 with mild decrease in volume of gastric wall mass. Decrease in volume of perigastric metastatic lymph node. Stable adrenal nodularity. No evidence of disease progression. -Cycle 6 FOLFOX 06/05/2014 - cycle 7 FOLFOX 06/19/2014 -Cycle 8 FOLFOX 07/03/2014 -Cycle 9 FOLFOX 07/17/2014 3. Multiple colon polyps documented on a colonoscopy 06/22/2012-tubular adenomas, tubulovillous adenomas, and hyperplastic polyps  4. Family history of colon cancer (mother). Status post genetics evaluation. Negative genetic testing on the Lynch/High Risk colon panel test  5. Pain secondary to the primary gastric mass or carcinomatosis -resolved  6. Periodontal  disease/bleeding at  the left upper incisors-he was evaluated by Dr. Enrique Sack, multiple extractions were recommended  7. Anorexia, nausea, and early satiety-improved  8. 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 Nausea following chemotherapy-improved with the addition of prophylactic Decadron  10. Skin rash-most likely secondary to Decadron, improved  11. History of neutropenia secondary to chemotherapy  12. 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 Nausea and diarrhea while treated with Xeloda. Improved.  15. Hand-foot syndrome 03/07/2013 manifesting with erythema, dry desquamation and pain on the palms and erythema and pain on the soles. Progressive hand pain 04/25/2013. Xeloda discontinued.  16. Anemia-microcytic; chronic, stable. Ferritin low at 6 11/21/2013. Oral iron initiated. 17. Delayed nausea following cycle 1 FOLFOX. Aloxi added beginning with cycle 2 04/10/2014.  18. Rash and pruritus during the oxaliplatin infusion with cycle 3 FOLFOX, improved with Benadryl, Solu-Medrol, and Pepcid, no associated symptoms, no rash with cycle 4 FOLFOX 05/08/2014  Disposition:  He appears well. He will complete cycle 9 FOLFOX today. He will return for an office visit and cycle 10 FOLFOX in 2 weeks. We will schedule a restaging CT after cycle 10.  Betsy Coder, MD  07/17/2014  9:47 AM

## 2014-07-17 NOTE — [facility] Oncology Navigator Note (Signed)
Met with patient and wife during follow up visit. Explained the role of the GI Nurse Navigator and provided my contact information and support group information. He is hoping today may be his last treatment. Denies any barriers to care at this time-understands English, but does not speak as well. His wife interprets for him.  Merceda Elks, RN, BSN GI Oncology [place]

## 2014-07-17 NOTE — Patient Instructions (Signed)
[facility] Discharge Instructions for Patients Receiving Chemotherapy  Today you received the following chemotherapy agents: Oxaliplatin, Leucovorin, 5Fu (push/pump)  To help prevent nausea and vomiting after your treatment, we encourage you to take your nausea medication as prescribed by your physician.   If you develop nausea and vomiting that is not controlled by your nausea medication, call the clinic.   BELOW ARE SYMPTOMS THAT SHOULD BE REPORTED IMMEDIATELY:  *FEVER GREATER THAN 100.5 F  *CHILLS WITH OR WITHOUT FEVER  NAUSEA AND VOMITING THAT IS NOT CONTROLLED WITH YOUR NAUSEA MEDICATION  *UNUSUAL SHORTNESS OF BREATH  *UNUSUAL BRUISING OR BLEEDING  TENDERNESS IN MOUTH AND THROAT WITH OR WITHOUT PRESENCE OF ULCERS  *URINARY PROBLEMS  *BOWEL PROBLEMS  UNUSUAL RASH Items with * indicate a potential emergency and should be followed up as soon as possible.  Feel free to call the clinic you have any questions or concerns. The clinic phone number is [phone].  Please show the [place] at check-in to the Emergency Department and triage nurse.

## 2014-07-19 ENCOUNTER — Ambulatory Visit (HOSPITAL_BASED_OUTPATIENT_CLINIC_OR_DEPARTMENT_OTHER): Payer: 59

## 2014-07-19 VITALS — BP 124/83 | HR 83 | Temp 98.0°F | Resp 18

## 2014-07-19 DIAGNOSIS — Z452 Encounter for adjustment and management of vascular access device: Secondary | ICD-10-CM

## 2014-07-19 DIAGNOSIS — C169 Malignant neoplasm of stomach, unspecified: Secondary | ICD-10-CM | POA: Diagnosis not present

## 2014-07-19 MED ORDER — SODIUM CHLORIDE 0.9 % IJ SOLN
10.0000 mL | INTRAMUSCULAR | Status: DC | PRN
  Administered 2014-07-19: 10 mL
  Filled 2014-07-19: qty 10

## 2014-07-19 MED ORDER — HEPARIN SOD (PORK) LOCK FLUSH 100 UNIT/ML IV SOLN
500.0000 [IU] | Freq: Once | INTRAVENOUS | Status: AC | PRN
  Administered 2014-07-19: 500 [IU]
  Filled 2014-07-19: qty 5

## 2014-07-19 NOTE — Patient Instructions (Signed)

## 2014-07-27 ENCOUNTER — Other Ambulatory Visit: Payer: Self-pay | Admitting: Oncology

## 2014-07-31 ENCOUNTER — Other Ambulatory Visit (HOSPITAL_BASED_OUTPATIENT_CLINIC_OR_DEPARTMENT_OTHER): Payer: BLUE CROSS/BLUE SHIELD

## 2014-07-31 ENCOUNTER — Ambulatory Visit (HOSPITAL_BASED_OUTPATIENT_CLINIC_OR_DEPARTMENT_OTHER): Payer: BLUE CROSS/BLUE SHIELD | Admitting: Nurse Practitioner

## 2014-07-31 ENCOUNTER — Ambulatory Visit (HOSPITAL_BASED_OUTPATIENT_CLINIC_OR_DEPARTMENT_OTHER): Payer: BLUE CROSS/BLUE SHIELD

## 2014-07-31 ENCOUNTER — Telehealth: Payer: Self-pay | Admitting: Oncology

## 2014-07-31 VITALS — BP 112/75 | HR 77 | Temp 98.6°F | Resp 18 | Ht 63.0 in | Wt 159.5 lb

## 2014-07-31 DIAGNOSIS — C786 Secondary malignant neoplasm of retroperitoneum and peritoneum: Secondary | ICD-10-CM | POA: Diagnosis not present

## 2014-07-31 DIAGNOSIS — D509 Iron deficiency anemia, unspecified: Secondary | ICD-10-CM

## 2014-07-31 DIAGNOSIS — C169 Malignant neoplasm of stomach, unspecified: Secondary | ICD-10-CM

## 2014-07-31 DIAGNOSIS — Z5111 Encounter for antineoplastic chemotherapy: Secondary | ICD-10-CM

## 2014-07-31 LAB — COMPREHENSIVE METABOLIC PANEL (CC13)
ALK PHOS: 90 U/L (ref 40–150)
ALT: 11 U/L (ref 0–55)
AST: 17 U/L (ref 5–34)
Albumin: 3.2 g/dL — ABNORMAL LOW (ref 3.5–5.0)
Anion Gap: 8 mEq/L (ref 3–11)
BUN: 9.1 mg/dL (ref 7.0–26.0)
CO2: 24 mEq/L (ref 22–29)
CREATININE: 0.8 mg/dL (ref 0.7–1.3)
Calcium: 8.3 mg/dL — ABNORMAL LOW (ref 8.4–10.4)
Chloride: 109 mEq/L (ref 98–109)
Glucose: 109 mg/dl (ref 70–140)
POTASSIUM: 4.3 meq/L (ref 3.5–5.1)
Sodium: 141 mEq/L (ref 136–145)
Total Bilirubin: 0.45 mg/dL (ref 0.20–1.20)
Total Protein: 6.3 g/dL — ABNORMAL LOW (ref 6.4–8.3)

## 2014-07-31 LAB — CBC WITH DIFFERENTIAL/PLATELET
BASO%: 1.6 % (ref 0.0–2.0)
Basophils Absolute: 0.1 10*3/uL (ref 0.0–0.1)
EOS%: 2.8 % (ref 0.0–7.0)
Eosinophils Absolute: 0.1 10*3/uL (ref 0.0–0.5)
HEMATOCRIT: 29.7 % — AB (ref 38.4–49.9)
HEMOGLOBIN: 9 g/dL — AB (ref 13.0–17.1)
LYMPH#: 1 10*3/uL (ref 0.9–3.3)
LYMPH%: 28.2 % (ref 14.0–49.0)
MCH: 22.6 pg — AB (ref 27.2–33.4)
MCHC: 30.4 g/dL — ABNORMAL LOW (ref 32.0–36.0)
MCV: 74.4 fL — ABNORMAL LOW (ref 79.3–98.0)
MONO#: 0.6 10*3/uL (ref 0.1–0.9)
MONO%: 17.7 % — ABNORMAL HIGH (ref 0.0–14.0)
NEUT#: 1.7 10*3/uL (ref 1.5–6.5)
NEUT%: 49.7 % (ref 39.0–75.0)
Platelets: 152 10*3/uL (ref 140–400)
RBC: 4 10*6/uL — ABNORMAL LOW (ref 4.20–5.82)
RDW: 23.6 % — AB (ref 11.0–14.6)
WBC: 3.5 10*3/uL — ABNORMAL LOW (ref 4.0–10.3)

## 2014-07-31 MED ORDER — PALONOSETRON HCL INJECTION 0.25 MG/5ML
0.2500 mg | Freq: Once | INTRAVENOUS | Status: AC
  Administered 2014-07-31: 0.25 mg via INTRAVENOUS

## 2014-07-31 MED ORDER — METHYLPREDNISOLONE SODIUM SUCC 125 MG IJ SOLR
125.0000 mg | Freq: Once | INTRAMUSCULAR | Status: AC
  Administered 2014-07-31: 125 mg via INTRAVENOUS

## 2014-07-31 MED ORDER — METHYLPREDNISOLONE SODIUM SUCC 125 MG IJ SOLR
INTRAMUSCULAR | Status: AC
  Filled 2014-07-31: qty 2

## 2014-07-31 MED ORDER — FAMOTIDINE IN NACL 20-0.9 MG/50ML-% IV SOLN
INTRAVENOUS | Status: AC
  Filled 2014-07-31: qty 50

## 2014-07-31 MED ORDER — DIPHENHYDRAMINE HCL 50 MG/ML IJ SOLN
25.0000 mg | Freq: Once | INTRAMUSCULAR | Status: AC
  Administered 2014-07-31: 25 mg via INTRAVENOUS

## 2014-07-31 MED ORDER — PALONOSETRON HCL INJECTION 0.25 MG/5ML
INTRAVENOUS | Status: AC
  Filled 2014-07-31: qty 5

## 2014-07-31 MED ORDER — FAMOTIDINE IN NACL 20-0.9 MG/50ML-% IV SOLN
20.0000 mg | Freq: Two times a day (BID) | INTRAVENOUS | Status: DC
  Administered 2014-07-31: 20 mg via INTRAVENOUS

## 2014-07-31 MED ORDER — SODIUM CHLORIDE 0.9 % IV SOLN
150.0000 mg | Freq: Once | INTRAVENOUS | Status: AC
  Administered 2014-07-31: 150 mg via INTRAVENOUS
  Filled 2014-07-31: qty 5

## 2014-07-31 MED ORDER — OXALIPLATIN CHEMO INJECTION 100 MG/20ML
83.0000 mg/m2 | Freq: Once | INTRAVENOUS | Status: AC
  Administered 2014-07-31: 150 mg via INTRAVENOUS
  Filled 2014-07-31: qty 30

## 2014-07-31 MED ORDER — DIPHENHYDRAMINE HCL 50 MG/ML IJ SOLN
INTRAMUSCULAR | Status: AC
  Filled 2014-07-31: qty 1

## 2014-07-31 MED ORDER — SODIUM CHLORIDE 0.9 % IV SOLN
1920.0000 mg/m2 | INTRAVENOUS | Status: DC
  Administered 2014-07-31: 3450 mg via INTRAVENOUS
  Filled 2014-07-31: qty 69

## 2014-07-31 MED ORDER — LEUCOVORIN CALCIUM INJECTION 350 MG
300.0000 mg/m2 | Freq: Once | INTRAVENOUS | Status: AC
  Administered 2014-07-31: 540 mg via INTRAVENOUS
  Filled 2014-07-31: qty 27

## 2014-07-31 MED ORDER — FLUOROURACIL CHEMO INJECTION 2.5 GM/50ML
300.0000 mg/m2 | Freq: Once | INTRAVENOUS | Status: AC
  Administered 2014-07-31: 550 mg via INTRAVENOUS
  Filled 2014-07-31: qty 11

## 2014-07-31 MED ORDER — DEXTROSE 5 % IV SOLN
Freq: Once | INTRAVENOUS | Status: AC
  Administered 2014-07-31: 10:00:00 via INTRAVENOUS

## 2014-07-31 NOTE — Patient Instructions (Signed)
[facility] Discharge Instructions for Patients Receiving Chemotherapy  Today you received the following chemotherapy agents FOLFOX  To help prevent nausea and vomiting after your treatment, we encourage you to take your nausea medication    If you develop nausea and vomiting that is not controlled by your nausea medication, call the clinic.   BELOW ARE SYMPTOMS THAT SHOULD BE REPORTED IMMEDIATELY:  *FEVER GREATER THAN 100.5 F  *CHILLS WITH OR WITHOUT FEVER  NAUSEA AND VOMITING THAT IS NOT CONTROLLED WITH YOUR NAUSEA MEDICATION  *UNUSUAL SHORTNESS OF BREATH  *UNUSUAL BRUISING OR BLEEDING  TENDERNESS IN MOUTH AND THROAT WITH OR WITHOUT PRESENCE OF ULCERS  *URINARY PROBLEMS  *BOWEL PROBLEMS  UNUSUAL RASH Items with * indicate a potential emergency and should be followed up as soon as possible.  Feel free to call the clinic you have any questions or concerns. The clinic phone number is [phone].  Please show the [place] at check-in to the Emergency Department and triage nurse.

## 2014-07-31 NOTE — Telephone Encounter (Signed)
Gave and prnted appt sched and avs for pt for [employer]

## 2014-07-31 NOTE — Progress Notes (Signed)
[facility] OFFICE PROGRESS NOTE   Diagnosis:  Gastric cancer  INTERVAL HISTORY:   [name] returns as scheduled. He completed cycle 9 FOLFOX 07/17/2014. He denies nausea/vomiting. No mouth sores. No diarrhea. Cold sensitivity lasted 3 days. No persistent neuropathy symptoms. He denies pain. No dysphagia. Good appetite.  Objective:  Vital signs in last 24 hours:  Blood pressure 112/75, pulse 77, temperature 98.6 F (37 C), temperature source Oral, resp. rate 18, height _0  (1.6 m), weight 159 lb 8 oz (72.349 kg), SpO2 100 %.    HEENT: No thrush or ulcers. Resp: Lungs clear bilaterally. Cardio: Regular rate and rhythm. GI: Abdomen soft and nontender. No hepatomegaly. Vascular: No leg edema. Neuro: Vibratory sense intact over the fingertips per tuning fork exam.  Skin: Acne-like rash scattered over the trunk. Port-A-Cath without erythema.    Lab Results:  Lab Results  Component Value Date   WBC 3.5* 07/31/2014   HGB 9.0* 07/31/2014   HCT 29.7* 07/31/2014   MCV 74.4* 07/31/2014   PLT 152 07/31/2014   NEUTROABS 1.7 07/31/2014    Imaging:  No results found.  Medications: I have reviewed the patient's current medications.  Assessment/Plan: 1.Metastatic gastric cancer-status post biopsy of a gastric mass on 07/20/2012 with the pathology confirming adenocarcinoma, HER-2 non-amplified.  - CT 07/18/2012 consistent with a primary gastric mass and metastatic omental/peritoneal nodules  -biopsy of a peritoneal nodule 07/31/2012 consistent with metastatic adenocarcinoma  -Cycle 1 of FOLFOX on 08/02/2012  -Restaging CT 10/23/2012 consistent with a partial response-decrease in the primary gastric tumor and omental/peritoneal implants  -cycle 10 of FOLFOX completed 01/11/2013  -normal CEA 01/11/2013  -Restaging CT 01/23/2013 with improvement in the peritoneal metastases and an increased size of an indeterminate splenic lesion  -Initiation of  maintenance Xeloda therapy on a 7 day on/7 day off schedule 01/29/2013.  -Xeloda dose reduced to 1000 mg twice daily 7 days on/7 days off beginning 02/26/2013 due to nausea and diarrhea.  -Xeloda dose reduced to 500 mg twice daily, 7 days/14 days off 03/20/2013.  -Xeloda discontinued 04/25/2013 due to progressive hand foot syndrome.  -CEA elevated at 9.1 on 04/25/2013.  -CEA stable at 9.5 on 05/09/2013.  -Initiation of infusional 5-FU as per the FOLFOX regimen 05/09/2013.  -CEA 7.4 on 05/24/2013.  -CEA 7.9 on 06/21/2013.  -CEA 9.3 on 08/22/2013 .  -CEA 11.1 on 10/01/2013.  -Restaging CT 10/01/2013 with persistent asymmetric gastric wall thickening in the greater curvature of the mid stomach. Dominant index gastrocolic lymph node decreased in size. Other smaller perigastric lymph nodes stable. No new or progressive findings in or around the stomach. Low-density subcapsular splenic lesion resolved.  -Continuation of infusional 5-fluorouracil, switched to a 4 week schedule beginning 01/02/2014 -CT 03/26/2014 with enlargement of the gastric mass and a possible new liver lesion -FOLFOX chemotherapy resumed 03/27/2014 -Restaging CT scan 06/03/2014 with mild decrease in volume of gastric wall mass. Decrease in volume of perigastric metastatic lymph node. Stable adrenal nodularity. No evidence of disease progression. -Cycle 6 FOLFOX 06/05/2014 - cycle 7 FOLFOX 06/19/2014 -Cycle 8 FOLFOX 07/03/2014 -Cycle 9 FOLFOX 07/17/2014 -Cycle 10 FOLFOX 07/31/2014 3. Multiple colon polyps documented on a colonoscopy 06/22/2012-tubular adenomas, tubulovillous adenomas, and hyperplastic polyps  4. Family history of colon cancer (mother). Status post genetics evaluation. Negative genetic testing on the Lynch/High Risk colon panel test  5. Pain secondary to the primary gastric mass or carcinomatosis -resolved  6. 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 Enrique Sack,  multiple extractions were recommended  7. Anorexia, nausea, and early satiety-improved  8. 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 Nausea following chemotherapy-improved with the addition of prophylactic Decadron  10. Skin rash-most likely secondary to Decadron, improved  11. History of neutropenia secondary to chemotherapy  12. 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 Nausea and diarrhea while treated with Xeloda. Improved.  15. Hand-foot syndrome 03/07/2013 manifesting with erythema, dry desquamation and pain on the palms and erythema and pain on the soles. Progressive hand pain 04/25/2013. Xeloda discontinued.  16. Anemia-microcytic; chronic, stable. Ferritin low at 6 11/21/2013. Oral iron initiated. 17. Delayed nausea following cycle 1 FOLFOX. Aloxi added beginning with cycle 2 04/10/2014.  18. Rash and pruritus during the oxaliplatin infusion with cycle 3 FOLFOX, improved with Benadryl, Solu-Medrol, and Pepcid, no associated symptoms, no rash with cycle 4 FOLFOX 05/08/2014    Disposition: [name] appears stable. He has completed 9 cycles of FOLFOX. Plan to proceed with cycle 10 today as scheduled. He will have restaging CT scans in approximately 2 weeks and return for a follow-up visit in 3 weeks.  Plan reviewed with Dr. Benay Spice.    Ned Card ANP/GNP-BC   07/31/2014  9:03 AM

## 2014-08-01 LAB — CEA: CEA: 9.8 ng/mL — AB (ref 0.0–5.0)

## 2014-08-02 ENCOUNTER — Encounter: Payer: Self-pay | Admitting: Oncology

## 2014-08-02 ENCOUNTER — Ambulatory Visit (HOSPITAL_BASED_OUTPATIENT_CLINIC_OR_DEPARTMENT_OTHER): Payer: BLUE CROSS/BLUE SHIELD

## 2014-08-02 VITALS — BP 118/78 | HR 83 | Temp 98.3°F

## 2014-08-02 DIAGNOSIS — Z452 Encounter for adjustment and management of vascular access device: Secondary | ICD-10-CM | POA: Diagnosis not present

## 2014-08-02 DIAGNOSIS — C169 Malignant neoplasm of stomach, unspecified: Secondary | ICD-10-CM

## 2014-08-02 MED ORDER — SODIUM CHLORIDE 0.9 % IJ SOLN
10.0000 mL | INTRAMUSCULAR | Status: DC | PRN
  Administered 2014-08-02: 10 mL
  Filled 2014-08-02: qty 10

## 2014-08-02 MED ORDER — HEPARIN SOD (PORK) LOCK FLUSH 100 UNIT/ML IV SOLN
500.0000 [IU] | Freq: Once | INTRAVENOUS | Status: AC | PRN
  Administered 2014-08-02: 500 [IU]
  Filled 2014-08-02: qty 5

## 2014-08-02 NOTE — Patient Instructions (Signed)
Fluorouracil, 5-FU injection What is this medicine? FLUOROURACIL, 5-FU (flure oh YOOR a sil) is a chemotherapy drug. It slows the growth of cancer cells. This medicine is used to treat many types of cancer like breast cancer, colon or rectal cancer, pancreatic cancer, and stomach cancer. This medicine may be used for other purposes; ask your health care provider or pharmacist if you have questions. COMMON BRAND NAME(S): Adrucil What should I tell my health care provider before I take this medicine? They need to know if you have any of these conditions: -blood disorders -dihydropyrimidine dehydrogenase (DPD) deficiency -infection (especially a virus infection such as chickenpox, cold sores, or herpes) -kidney disease -liver disease -malnourished, poor nutrition -recent or ongoing radiation therapy -an unusual or allergic reaction to fluorouracil, other chemotherapy, other medicines, foods, dyes, or preservatives -pregnant or trying to get pregnant -breast-feeding How should I use this medicine? This drug is given as an infusion or injection into a vein. It is administered in a hospital or clinic by a specially trained health care professional. Talk to your pediatrician regarding the use of this medicine in children. Special care may be needed. Overdosage: If you think you have taken too much of this medicine contact a poison control center or emergency room at once. NOTE: This medicine is only for you. Do not share this medicine with others. What if I miss a dose? It is important not to miss your dose. Call your doctor or health care professional if you are unable to keep an appointment. What may interact with this medicine? -allopurinol -cimetidine -dapsone -digoxin -hydroxyurea -leucovorin -levamisole -medicines for seizures like ethotoin, fosphenytoin, phenytoin -medicines to increase blood counts like filgrastim, pegfilgrastim, sargramostim -medicines that treat or prevent blood  clots like warfarin, enoxaparin, and dalteparin -methotrexate -metronidazole -pyrimethamine -some other chemotherapy drugs like busulfan, cisplatin, estramustine, vinblastine -trimethoprim -trimetrexate -vaccines Talk to your doctor or health care professional before taking any of these medicines: -acetaminophen -aspirin -ibuprofen -ketoprofen -naproxen This list may not describe all possible interactions. Give your health care provider a list of all the medicines, herbs, non-prescription drugs, or dietary supplements you use. Also tell them if you smoke, drink alcohol, or use illegal drugs. Some items may interact with your medicine. What should I watch for while using this medicine? Visit your doctor for checks on your progress. This drug may make you feel generally unwell. This is not uncommon, as chemotherapy can affect healthy cells as well as cancer cells. Report any side effects. Continue your course of treatment even though you feel ill unless your doctor tells you to stop. In some cases, you may be given additional medicines to help with side effects. Follow all directions for their use. Call your doctor or health care professional for advice if you get a fever, chills or sore throat, or other symptoms of a cold or flu. Do not treat yourself. This drug decreases your body's ability to fight infections. Try to avoid being around people who are sick. This medicine may increase your risk to bruise or bleed. Call your doctor or health care professional if you notice any unusual bleeding. Be careful brushing and flossing your teeth or using a toothpick because you may get an infection or bleed more easily. If you have any dental work done, tell your dentist you are receiving this medicine. Avoid taking products that contain aspirin, acetaminophen, ibuprofen, naproxen, or ketoprofen unless instructed by your doctor. These medicines may hide a fever. Do not become pregnant while taking this    medicine. Women should inform their doctor if they wish to become pregnant or think they might be pregnant. There is a potential for serious side effects to an unborn child. Talk to your health care professional or pharmacist for more information. Do not breast-feed an infant while taking this medicine. Men should inform their doctor if they wish to father a child. This medicine may lower sperm counts. Do not treat diarrhea with over the counter products. Contact your doctor if you have diarrhea that lasts more than 2 days or if it is severe and watery. This medicine can make you more sensitive to the sun. Keep out of the sun. If you cannot avoid being in the sun, wear protective clothing and use sunscreen. Do not use sun lamps or tanning beds/booths. What side effects may I notice from receiving this medicine? Side effects that you should report to your doctor or health care professional as soon as possible: -allergic reactions like skin rash, itching or hives, swelling of the face, lips, or tongue -low blood counts - this medicine may decrease the number of white blood cells, red blood cells and platelets. You may be at increased risk for infections and bleeding. -signs of infection - fever or chills, cough, sore throat, pain or difficulty passing urine -signs of decreased platelets or bleeding - bruising, pinpoint red spots on the skin, black, tarry stools, blood in the urine -signs of decreased red blood cells - unusually weak or tired, fainting spells, lightheadedness -breathing problems -changes in vision -chest pain -mouth sores -nausea and vomiting -pain, swelling, redness at site where injected -pain, tingling, numbness in the hands or feet -redness, swelling, or sores on hands or feet -stomach pain -unusual bleeding Side effects that usually do not require medical attention (report to your doctor or health care professional if they continue or are bothersome): -changes in finger or  toe nails -diarrhea -dry or itchy skin -hair loss -headache -loss of appetite -sensitivity of eyes to the light -stomach upset -unusually teary eyes This list may not describe all possible side effects. Call your doctor for medical advice about side effects. You may report side effects to FDA at 1-800-FDA-1088. Where should I keep my medicine? This drug is given in a hospital or clinic and will not be stored at home. NOTE: This sheet is a summary. It may not cover all possible information. If you have questions about this medicine, talk to your doctor, pharmacist, or health care provider.  2015, [employer]. (2007-07-12 13:53:16)   

## 2014-08-02 NOTE — Progress Notes (Signed)
Pt has concerns regarding his bills.  All of his claims are being denied because no referral was obtained.  I explained this to him, his wife and insurance agent Barnabas Lister).  I advised him to obtain a PCP and have them do a referral for his future visits so his insurance will pay.  I gave him a hardship settlement application to apply for a discount thru the hospital to help with his balance.

## 2014-08-09 ENCOUNTER — Telehealth: Payer: Self-pay | Admitting: *Deleted

## 2014-08-09 NOTE — Telephone Encounter (Signed)
Left message on voicemail informing pt of change in MD schedule. Appointment will be changed to 5/31 at 12PM. Requested he call office to confirm.

## 2014-08-12 ENCOUNTER — Telehealth: Payer: Self-pay | Admitting: Oncology

## 2014-08-12 NOTE — Telephone Encounter (Signed)
s.w. pt and advised on 6.1 appt moved to 5.31

## 2014-08-20 ENCOUNTER — Encounter (HOSPITAL_COMMUNITY): Payer: Self-pay

## 2014-08-20 ENCOUNTER — Telehealth: Payer: Self-pay | Admitting: Oncology

## 2014-08-20 ENCOUNTER — Ambulatory Visit (HOSPITAL_COMMUNITY)
Admission: RE | Admit: 2014-08-20 | Discharge: 2014-08-20 | Disposition: A | Discharge status: Home / Self Care | Payer: BLUE CROSS/BLUE SHIELD | Source: Ambulatory Visit | Attending: Nurse Practitioner | Admitting: Nurse Practitioner

## 2014-08-20 ENCOUNTER — Ambulatory Visit (HOSPITAL_BASED_OUTPATIENT_CLINIC_OR_DEPARTMENT_OTHER): Payer: BLUE CROSS/BLUE SHIELD | Admitting: Oncology

## 2014-08-20 VITALS — BP 110/62 | HR 86 | Temp 98.0°F | Resp 18 | Ht 63.0 in | Wt 158.0 lb

## 2014-08-20 DIAGNOSIS — G620 Drug-induced polyneuropathy: Secondary | ICD-10-CM | POA: Diagnosis not present

## 2014-08-20 DIAGNOSIS — D509 Iron deficiency anemia, unspecified: Secondary | ICD-10-CM | POA: Diagnosis not present

## 2014-08-20 DIAGNOSIS — C169 Malignant neoplasm of stomach, unspecified: Secondary | ICD-10-CM | POA: Diagnosis not present

## 2014-08-20 DIAGNOSIS — C786 Secondary malignant neoplasm of retroperitoneum and peritoneum: Secondary | ICD-10-CM | POA: Diagnosis not present

## 2014-08-20 MED ORDER — IOHEXOL 300 MG/ML  SOLN
100.0000 mL | Freq: Once | INTRAMUSCULAR | Status: AC | PRN
  Administered 2014-08-20: 100 mL via INTRAVENOUS

## 2014-08-20 NOTE — Telephone Encounter (Signed)
Gave avs & calendar for June. °

## 2014-08-20 NOTE — Progress Notes (Signed)
[facility] OFFICE PROGRESS NOTE   Diagnosis: Gastric cancer  INTERVAL HISTORY:   Joseph Salinas returns as scheduled. He completed another cycle of FOLFOX 07/31/2014. He reports increased nausea following this cycle of chemotherapy. No emesis. He notes "swelling "in the fingers. No numbness. Good appetite and energy level.  Objective:  Vital signs in last 24 hours:  Blood pressure 110/62, pulse 86, temperature 98 F (36.7 C), temperature source Oral, resp. rate 18, height 5' 3"  (1.6 m), weight 158 lb (71.668 kg), SpO2 100 %.    HEENT: No thrush or ulcers Resp: Lungs clear bilaterally Cardio: Regular rate and rhythm GI: No hepatomegaly, nontender, no mass Vascular: No leg edema Neuro: The vibratory sense is intact at the fingertips bilaterally    Portacath/PICC-without erythema  Lab Results:  Lab Results  Component Value Date   WBC 3.5* 07/31/2014   HGB 9.0* 07/31/2014   HCT 29.7* 07/31/2014   MCV 74.4* 07/31/2014   PLT 152 07/31/2014   NEUTROABS 1.7 07/31/2014     Lab Results  Component Value Date   CEA 9.8* 07/31/2014    Imaging:  Ct Abdomen Pelvis W Contrast  08/20/2014   CLINICAL DATA:  Gastric cancer, chemotherapy in progress.  EXAM: CT ABDOMEN AND PELVIS WITH CONTRAST  TECHNIQUE: Multidetector CT imaging of the abdomen and pelvis was performed using the standard protocol following bolus administration of intravenous contrast.  CONTRAST:  116m OMNIPAQUE IOHEXOL 300 MG/ML  SOLN  COMPARISON:  Multiple exams, including 06/03/2014  FINDINGS: Lower chest:  Upper normal amount of pericardial fluid.  Hepatobiliary: Similar appearance of multiple fluid density hepatic lesions favoring cysts. The gallbladder wall appears thickened although some of this could be due to contraction.  Pancreas: Unremarkable  Spleen: Unremarkable  Adrenals/Urinary Tract: Stable mild bilateral adrenal nodularity without overt mass.  Stomach/Bowel: Greater curvature/anterior wall  gastric mass measures 3.5 cm in thickness, by my measurements previously the same.  Localized wall thickening in the distal ileum is most likely due to peristaltic activity.  Vascular/Lymphatic: A tumor nodule just anterior to the stomach measures 1.3 cm in short axis, previously 1.6 cm by my measurements. Small peripancreatic and gastrohepatic ligament lymph nodes currently not pathologically enlarged.  Reproductive: Mildly prominent prostate gland 4.8 by 3.6 cm, with right eccentric calcifications along the junction of the central and peripheral zones.  Other: No supplemental non-categorized findings.  Musculoskeletal: Disc bulge at L5-S1. Partially segmental S1 vertebra.  IMPRESSION: 1. Similar size of anterior graft stroke wall mass at about 3.5 mm in thickness. The tumor nodule just anterior to the stomach measures 1.3 cm in short axis, previously 1.6 cm, indicating mild improvement. No other omental/mesenteric adenopathy is currently evident. 2. Stable hepatic cysts.   Electronically Signed   By: WVan ClinesM.D.   On: 08/20/2014 08:51    Medications: I have reviewed the patient's current medications.  Assessment/Plan: 1.Metastatic gastric cancer-status post biopsy of a gastric mass on 07/20/2012 with the pathology confirming adenocarcinoma, HER-2 non-amplified.  - CT 07/18/2012 consistent with a primary gastric mass and metastatic omental/peritoneal nodules  -biopsy of a peritoneal nodule 07/31/2012 consistent with metastatic adenocarcinoma  -Cycle 1 of FOLFOX on 08/02/2012  -Restaging CT 10/23/2012 consistent with a partial response-decrease in the primary gastric tumor and omental/peritoneal implants  -cycle 10 of FOLFOX completed 01/11/2013  -normal CEA 01/11/2013  -Restaging CT 01/23/2013 with improvement in the peritoneal metastases and an increased size of an indeterminate splenic lesion  -Initiation of maintenance Xeloda therapy on a  7 day on/7 day off schedule 01/29/2013.   -Xeloda dose reduced to 1000 mg twice daily 7 days on/7 days off beginning 02/26/2013 due to nausea and diarrhea.  -Xeloda dose reduced to 500 mg twice daily, 7 days/14 days off 03/20/2013.  -Xeloda discontinued 04/25/2013 due to progressive hand foot syndrome.  -CEA elevated at 9.1 on 04/25/2013.  -CEA stable at 9.5 on 05/09/2013.  -Initiation of infusional 5-FU as per the FOLFOX regimen 05/09/2013.  -CEA 7.4 on 05/24/2013.  -CEA 7.9 on 06/21/2013.  -CEA 9.3 on 08/22/2013 .  -CEA 11.1 on 10/01/2013.  -Restaging CT 10/01/2013 with persistent asymmetric gastric wall thickening in the greater curvature of the mid stomach. Dominant index gastrocolic lymph node decreased in size. Other smaller perigastric lymph nodes stable. No new or progressive findings in or around the stomach. Low-density subcapsular splenic lesion resolved.  -Continuation of infusional 5-fluorouracil, switched to a 4 week schedule beginning 01/02/2014 -CT 03/26/2014 with enlargement of the gastric mass and a possible new liver lesion -FOLFOX chemotherapy resumed 03/27/2014 -Restaging CT scan 06/03/2014 with mild decrease in volume of gastric wall mass. Decrease in volume of perigastric metastatic lymph node. Stable adrenal nodularity. No evidence of disease progression. -Cycle 6 FOLFOX 06/05/2014 - cycle 7 FOLFOX 06/19/2014 -Cycle 8 FOLFOX 07/03/2014 -Cycle 9 FOLFOX 07/17/2014 -Cycle 10 FOLFOX 07/31/2014 -Restaging CT 08/20/2014 revealed stable gastric wall thickening and a slight decrease in the size of a tumor nodule anterior to the stomach -Cycle 11 FOLFOX 08/28/2014 3. Multiple colon polyps documented on a colonoscopy 06/22/2012-tubular adenomas, tubulovillous adenomas, and hyperplastic polyps  4. Family history of colon cancer (mother). Status post genetics evaluation. Negative genetic testing on the Lynch/High Risk colon panel test  5. Pain secondary to the primary gastric mass or carcinomatosis  -resolved  6. 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 Enrique Sack, multiple extractions were recommended  7. Anorexia, nausea, and early satiety-improved  8. 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 Nausea following chemotherapy-improved with the addition of prophylactic Decadron  10. Skin rash-most likely secondary to Decadron, improved  11. History of neutropenia secondary to chemotherapy  12. 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 Nausea and diarrhea while treated with Xeloda. Improved.  15. Hand-foot syndrome 03/07/2013 manifesting with erythema, dry desquamation and pain on the palms and erythema and pain on the soles. Progressive hand pain 04/25/2013. Xeloda discontinued.  16. Anemia-microcytic; chronic, stable. Ferritin low at 6 11/21/2013. Oral iron initiated. 17. Delayed nausea following cycle 1 FOLFOX. Aloxi added beginning with cycle 2 04/10/2014.  18. Rash and pruritus during the oxaliplatin infusion with cycle 3 FOLFOX, improved with Benadryl, Solu-Medrol, and Pepcid, no associated symptoms, no rash with cycle 4 FOLFOX 05/08/2014  Disposition:  Joseph overall status appears unchanged. He maintains a good performance status and is tolerating the FOLFOX well. I reviewed the CT images with Joseph Salinas and Joseph Salinas. There is no evidence of disease progression. We decided to proceed with 2 additional cycles of FOLFOX prior to a treatment break. He will be scheduled for the next cycle of FOLFOX 08/28/2014. He will return for an office visit and chemotherapy 09/11/2014.  Betsy Coder, MD  08/20/2014  12:27 PM

## 2014-08-21 ENCOUNTER — Ambulatory Visit: Payer: Self-pay | Admitting: Oncology

## 2014-08-28 ENCOUNTER — Ambulatory Visit (HOSPITAL_BASED_OUTPATIENT_CLINIC_OR_DEPARTMENT_OTHER): Payer: BLUE CROSS/BLUE SHIELD | Admitting: Nurse Practitioner

## 2014-08-28 ENCOUNTER — Ambulatory Visit (HOSPITAL_BASED_OUTPATIENT_CLINIC_OR_DEPARTMENT_OTHER): Payer: BLUE CROSS/BLUE SHIELD

## 2014-08-28 ENCOUNTER — Other Ambulatory Visit (HOSPITAL_BASED_OUTPATIENT_CLINIC_OR_DEPARTMENT_OTHER): Payer: BLUE CROSS/BLUE SHIELD

## 2014-08-28 ENCOUNTER — Other Ambulatory Visit: Payer: Self-pay | Admitting: Hematology and Oncology

## 2014-08-28 VITALS — BP 137/76 | HR 64 | Temp 97.0°F | Resp 18

## 2014-08-28 DIAGNOSIS — D509 Iron deficiency anemia, unspecified: Secondary | ICD-10-CM | POA: Diagnosis not present

## 2014-08-28 DIAGNOSIS — Z5111 Encounter for antineoplastic chemotherapy: Secondary | ICD-10-CM

## 2014-08-28 DIAGNOSIS — C786 Secondary malignant neoplasm of retroperitoneum and peritoneum: Secondary | ICD-10-CM

## 2014-08-28 DIAGNOSIS — T451X5A Adverse effect of antineoplastic and immunosuppressive drugs, initial encounter: Secondary | ICD-10-CM

## 2014-08-28 DIAGNOSIS — T7840XA Allergy, unspecified, initial encounter: Secondary | ICD-10-CM

## 2014-08-28 DIAGNOSIS — C169 Malignant neoplasm of stomach, unspecified: Secondary | ICD-10-CM | POA: Diagnosis not present

## 2014-08-28 LAB — COMPREHENSIVE METABOLIC PANEL (CC13)
ALT: 7 U/L (ref 0–55)
AST: 14 U/L (ref 5–34)
Albumin: 3.1 g/dL — ABNORMAL LOW (ref 3.5–5.0)
Alkaline Phosphatase: 94 U/L (ref 40–150)
Anion Gap: 6 mEq/L (ref 3–11)
BILIRUBIN TOTAL: 0.3 mg/dL (ref 0.20–1.20)
BUN: 8 mg/dL (ref 7.0–26.0)
CHLORIDE: 110 meq/L — AB (ref 98–109)
CO2: 25 mEq/L (ref 22–29)
Calcium: 8.4 mg/dL (ref 8.4–10.4)
Creatinine: 0.8 mg/dL (ref 0.7–1.3)
EGFR: >90 mL/min/{1.73_m2} (ref >90)
GLUCOSE: 95 mg/dL (ref 70–140)
POTASSIUM: 3.9 meq/L (ref 3.5–5.1)
SODIUM: 142 meq/L (ref 136–145)
Total Protein: 6.4 g/dL (ref 6.4–8.3)

## 2014-08-28 LAB — CBC WITH DIFFERENTIAL/PLATELET
BASO%: 1.4 % (ref 0.0–2.0)
BASOS ABS: 0.1 10*3/uL (ref 0.0–0.1)
EOS%: 1.9 % (ref 0.0–7.0)
Eosinophils Absolute: 0.1 10*3/uL (ref 0.0–0.5)
HCT: 30.3 % — ABNORMAL LOW (ref 38.4–49.9)
HEMOGLOBIN: 8.9 g/dL — AB (ref 13.0–17.1)
LYMPH%: 28 % (ref 14.0–49.0)
MCH: 22.3 pg — ABNORMAL LOW (ref 27.2–33.4)
MCHC: 29.4 g/dL — ABNORMAL LOW (ref 32.0–36.0)
MCV: 75.9 fL — AB (ref 79.3–98.0)
MONO#: 0.8 10*3/uL (ref 0.1–0.9)
MONO%: 15.8 % — ABNORMAL HIGH (ref 0.0–14.0)
NEUT%: 52.9 % (ref 39.0–75.0)
NEUTROS ABS: 2.7 10*3/uL (ref 1.5–6.5)
Platelets: 184 10*3/uL (ref 140–400)
RBC: 3.99 10*6/uL — ABNORMAL LOW (ref 4.20–5.82)
RDW: 20.5 % — AB (ref 11.0–14.6)
WBC: 5.1 10*3/uL (ref 4.0–10.3)
lymph#: 1.4 10*3/uL (ref 0.9–3.3)

## 2014-08-28 LAB — CEA: CEA: 10.7 ng/mL — AB (ref 0.0–5.0)

## 2014-08-28 MED ORDER — METHYLPREDNISOLONE SODIUM SUCC 125 MG IJ SOLR
125.0000 mg | Freq: Once | INTRAMUSCULAR | Status: AC
  Administered 2014-08-28: 125 mg via INTRAVENOUS

## 2014-08-28 MED ORDER — OXALIPLATIN CHEMO INJECTION 100 MG/20ML
82.0000 mg/m2 | Freq: Once | INTRAVENOUS | Status: AC
  Administered 2014-08-28: 150 mg via INTRAVENOUS
  Filled 2014-08-28: qty 30

## 2014-08-28 MED ORDER — FAMOTIDINE IN NACL 20-0.9 MG/50ML-% IV SOLN
INTRAVENOUS | Status: AC
  Filled 2014-08-28: qty 50

## 2014-08-28 MED ORDER — DEXTROSE 5 % IV SOLN
Freq: Once | INTRAVENOUS | Status: AC
  Administered 2014-08-28: 12:00:00 via INTRAVENOUS

## 2014-08-28 MED ORDER — FAMOTIDINE IN NACL 20-0.9 MG/50ML-% IV SOLN
20.0000 mg | Freq: Two times a day (BID) | INTRAVENOUS | Status: DC
  Administered 2014-08-28: 20 mg via INTRAVENOUS

## 2014-08-28 MED ORDER — SODIUM CHLORIDE 0.9 % IV SOLN
150.0000 mg | Freq: Once | INTRAVENOUS | Status: AC
  Administered 2014-08-28: 150 mg via INTRAVENOUS
  Filled 2014-08-28: qty 5

## 2014-08-28 MED ORDER — DIPHENHYDRAMINE HCL 50 MG/ML IJ SOLN
25.0000 mg | Freq: Once | INTRAMUSCULAR | Status: AC
  Administered 2014-08-28: 25 mg via INTRAVENOUS

## 2014-08-28 MED ORDER — SODIUM CHLORIDE 0.9 % IV SOLN
1920.0000 mg/m2 | INTRAVENOUS | Status: DC
  Administered 2014-08-28: 3450 mg via INTRAVENOUS
  Filled 2014-08-28: qty 69

## 2014-08-28 MED ORDER — DIPHENHYDRAMINE HCL 50 MG/ML IJ SOLN
INTRAMUSCULAR | Status: AC
  Filled 2014-08-28: qty 1

## 2014-08-28 MED ORDER — PALONOSETRON HCL INJECTION 0.25 MG/5ML
0.2500 mg | Freq: Once | INTRAVENOUS | Status: AC
Start: 2014-08-28 — End: 2014-08-28
  Administered 2014-08-28: 0.25 mg via INTRAVENOUS

## 2014-08-28 MED ORDER — PALONOSETRON HCL INJECTION 0.25 MG/5ML
INTRAVENOUS | Status: AC
  Filled 2014-08-28: qty 5

## 2014-08-28 MED ORDER — SODIUM CHLORIDE 0.9 % IV SOLN
Freq: Once | INTRAVENOUS | Status: AC
  Administered 2014-08-28: 11:00:00 via INTRAVENOUS

## 2014-08-28 MED ORDER — METHYLPREDNISOLONE SODIUM SUCC 125 MG IJ SOLR
INTRAMUSCULAR | Status: AC
  Filled 2014-08-28: qty 2

## 2014-08-28 MED ORDER — FLUOROURACIL CHEMO INJECTION 2.5 GM/50ML
300.0000 mg/m2 | Freq: Once | INTRAVENOUS | Status: AC
  Administered 2014-08-28: 550 mg via INTRAVENOUS
  Filled 2014-08-28: qty 11

## 2014-08-28 MED ORDER — DEXTROSE 5 % IV SOLN
300.0000 mg/m2 | Freq: Once | INTRAVENOUS | Status: AC
  Administered 2014-08-28: 540 mg via INTRAVENOUS
  Filled 2014-08-28: qty 27

## 2014-08-28 NOTE — Progress Notes (Signed)
[place] noted on patient's upper chest. Infusion stopped; Selena Lesser NP made aware 8938- Per [place] NP, give Benadryl 25 mg IV x 1 dose, restart infusion at 115pm. Pt stable, no other complaints or discomfort. 1315- Infusion restarted. Hives completely resolved.  1500- Pt completed infusion without any further complications. Discharged with wife ambulatory in no acute distress. Knows to call us with any questions or concerns.

## 2014-08-28 NOTE — Patient Instructions (Signed)
[facility] Discharge Instructions for Patients Receiving Chemotherapy  Today you received the following chemotherapy agents: Oxaliplatin, Leucovorin, 5FU/Adrucil (push/pump)  To help prevent nausea and vomiting after your treatment, we encourage you to take your nausea medication as prescribed by your physician.   If you develop nausea and vomiting that is not controlled by your nausea medication, call the clinic.   BELOW ARE SYMPTOMS THAT SHOULD BE REPORTED IMMEDIATELY:  *FEVER GREATER THAN 100.5 F  *CHILLS WITH OR WITHOUT FEVER  NAUSEA AND VOMITING THAT IS NOT CONTROLLED WITH YOUR NAUSEA MEDICATION  *UNUSUAL SHORTNESS OF BREATH  *UNUSUAL BRUISING OR BLEEDING  TENDERNESS IN MOUTH AND THROAT WITH OR WITHOUT PRESENCE OF ULCERS  *URINARY PROBLEMS  *BOWEL PROBLEMS  UNUSUAL RASH Items with * indicate a potential emergency and should be followed up as soon as possible.  Feel free to call the clinic you have any questions or concerns. The clinic phone number is [phone].  Please show the [place] at check-in to the Emergency Department and triage nurse.

## 2014-08-29 ENCOUNTER — Encounter: Payer: Self-pay | Admitting: Nurse Practitioner

## 2014-08-29 ENCOUNTER — Telehealth: Payer: Self-pay

## 2014-08-29 NOTE — Assessment & Plan Note (Addendum)
Patient was in the midst of receiving the oxaliplatin/leucovorin portion of his FOLFOX chemotherapy; and developed onset of mild hive lesions to his neck only.  Infusion was held.  Patient denied any chest pain, chest pressure, shortness of breath, or other issues.  Patient managing airway and secretions well.  Vital signs remained stable throughout.  Patient was given Benadryl 25 mg IV, Pepcid 20 mg IV, and soulmedrol 125 mg IV per hypersensitivity protocol.  All symptoms did essentially resolve; and patient was able to complete his chemotherapy as previously directed.  Interpreter with pt during visit.

## 2014-08-29 NOTE — Progress Notes (Signed)
SYMPTOM MANAGEMENT CLINIC   HPI: Joseph Salinas 54 y.o. male diagnosed with gastric cancer.  Currently undergoing FOLFOX chemotherapy regimen.  Patient presents to the [facility] today to receive cycle 25 of his FOLFOX chemotherapy regimen.  He experienced some mild hives to his neck area only during the oxaliplatin/leucovorin portion of his chemotherapy.  The infusion was held; and symptoms managed per hypersensitivity protocol.  All of patient's symptoms did eventually resolve; and patient was able to complete the chemotherapy infusion as previously directed.  Vital signs remained stable throughout.  Also, patient did have an interpreter at bedside for this visit.   Hypersensitivity Reaction    CURRENT THERAPY: Upcoming Treatment Dates - COLORECTAL 5FU / Leucovorin Modified [place] q14d Days with orders from any treatment category:  04/26/2014      [facility] COMMUNICATION      sodium chloride 0.9 % injection 10 mL      heparin lock flush 100 unit/mL      heparin lock flush 100 unit/mL      alteplase (CATHFLO ACTIVASE) injection 2 mg      sodium chloride 0.9 % injection 3 mL      Cold Pack 1 packet 05/08/2014      SCHEDULING COMMUNICATION      fosaprepitant (EMEND) 150 mg in sodium chloride 0.9 % 145 mL IVPB      palonosetron (ALOXI) injection 0.25 mg      dexamethasone (DECADRON) injection 10 mg      oxaliplatin (ELOXATIN) 155 mg in dextrose 5 % 500 mL chemo infusion      leucovorin 540 mg in dextrose 5 % 250 mL infusion      fluorouracil (ADRUCIL) chemo injection 550 mg      fluorouracil (ADRUCIL) 3,450 mg in sodium chloride 0.9 % 150 mL chemo infusion      sodium chloride 0.9 % injection 10 mL      heparin lock flush 100 unit/mL      heparin lock flush 100 unit/mL      alteplase (CATHFLO ACTIVASE) injection 2 mg      sodium chloride 0.9 % injection 3 mL      Cold Pack 1 packet      0.9 %  sodium chloride infusion      TREATMENT CONDITIONS 05/10/2014  SCHEDULING COMMUNICATION      sodium chloride 0.9 % injection 10 mL      heparin lock flush 100 unit/mL      heparin lock flush 100 unit/mL      alteplase (CATHFLO ACTIVASE) injection 2 mg      sodium chloride 0.9 % injection 3 mL      Cold Pack 1 packet    ROS  Past Medical History  Diagnosis Date  . Colon polyp   . Family history of colon cancer   . Gastric cancer 08/09/2012    Past Surgical History  Procedure Laterality Date  . No prior surgery    . Colonoscopy    . Insertion of port a cath Right 08/08/2012  . Esophagogastroduodenoscopy endoscopy    . Biospy  07/31/12    gastric   . Multiple extractions with alveoloplasty N/A 11/10/2012    Procedure: extraction of tooth #'s 1, 2,10,14,15,16,17,18, 23, 24, 25, 26, 32 with alveoloplasty and gross debridement of remaining teeth;  Surgeon: Lenn Cal, DDS;  Location: [facility];  Service: Oral Surgery;  Laterality: N/A;    has Hx of adenomatous colonic polyps; Gastric cancer; Chronic periodontitis;  Hypersensitivity reaction; Family history of colon cancer; and Genetic testing on his problem list.    is allergic to penicillins.    Medication List       This list is accurate as of: 08/28/14 11:59 PM.  Always use your most recent med list.               docusate sodium 50 MG capsule  Commonly known as:  COLACE  Take 1 capsule (50 mg total) by mouth 2 (two) times daily.     lidocaine-prilocaine cream  Commonly known as:  EMLA  Apply 1 application topically as needed. Apply 1 tsp to PAC site 1-2 hours prior to stick and cover with plastic wrap to numb site     LORazepam 0.5 MG tablet  Commonly known as:  ATIVAN  Take 1 tablet (0.5 mg total) by mouth every 8 (eight) hours as needed for anxiety. Take for nausea every 8 hours as needed; may take dose at bedtime as needed for sleep.     magic mouthwash Soln  Take 5 mLs by mouth every 4 (four) hours as needed for mouth pain (5cc every 4 hrs as needed, swish and spit).      prochlorperazine 10 MG tablet  Commonly known as:  COMPAZINE  Take 1 tablet (10 mg total) by mouth every 6 (six) hours as needed (nausea).         PHYSICAL EXAMINATION  Vitals: 139/82, HR 76, temp 98.1, 100%  Physical Exam  Constitutional: He is oriented to person, place, and time and well-developed, well-nourished, and in no distress.  HENT:  Head: Normocephalic and atraumatic.  Mouth/Throat: Oropharynx is clear and moist.  Eyes: Conjunctivae and EOM are normal. Pupils are equal, round, and reactive to light. Right eye exhibits no discharge. Left eye exhibits no discharge. No scleral icterus.  Neck: Normal range of motion. Neck supple. No JVD present. No tracheal deviation present. No thyromegaly present.  Cardiovascular: Normal rate, regular rhythm, normal heart sounds and intact distal pulses.   Pulmonary/Chest: Effort normal and breath sounds normal. No stridor. No respiratory distress. He has no wheezes. He has no rales. He exhibits no tenderness.  Abdominal: Soft. Bowel sounds are normal. He exhibits no distension and no mass. There is no tenderness. There is no rebound and no guarding.  Musculoskeletal: Normal range of motion. He exhibits no edema or tenderness.  Lymphadenopathy:    He has no cervical adenopathy.  Neurological: He is alert and oriented to person, place, and time. Gait normal.  Skin: Skin is warm and dry. Rash noted. No erythema. No pallor.  Mild hives lesions to neck only.  Patient is not complaining of pruritus.  Psychiatric: Affect normal.  Nursing note and vitals reviewed.   LABORATORY DATA:. Appointment on 08/28/2014  Component Date Value Ref Range Status  . WBC 08/28/2014 5.1  4.0 - 10.3 10e3/uL Final  . NEUT# 08/28/2014 2.7  1.5 - 6.5 10e3/uL Final  . HGB 08/28/2014 8.9* 13.0 - 17.1 g/dL Final  . HCT 08/28/2014 30.3* 38.4 - 49.9 % Final  . Platelets 08/28/2014 184  140 - 400 10e3/uL Final  . MCV 08/28/2014 75.9* 79.3 - 98.0 fL Final  . MCH  08/28/2014 22.3* 27.2 - 33.4 pg Final  . MCHC 08/28/2014 29.4* 32.0 - 36.0 g/dL Final  . RBC 08/28/2014 3.99* 4.20 - 5.82 10e6/uL Final  . RDW 08/28/2014 20.5* 11.0 - 14.6 % Final  . lymph# 08/28/2014 1.4  0.9 - 3.3 10e3/uL Final  .  MONO# 08/28/2014 0.8  0.1 - 0.9 10e3/uL Final  . Eosinophils Absolute 08/28/2014 0.1  0.0 - 0.5 10e3/uL Final  . Basophils Absolute 08/28/2014 0.1  0.0 - 0.1 10e3/uL Final  . NEUT% 08/28/2014 52.9  39.0 - 75.0 % Final  . LYMPH% 08/28/2014 28.0  14.0 - 49.0 % Final  . MONO% 08/28/2014 15.8* 0.0 - 14.0 % Final  . EOS% 08/28/2014 1.9  0.0 - 7.0 % Final  . BASO% 08/28/2014 1.4  0.0 - 2.0 % Final  . Sodium 08/28/2014 142  136 - 145 mEq/L Final  . Potassium 08/28/2014 3.9  3.5 - 5.1 mEq/L Final  . Chloride 08/28/2014 110* 98 - 109 mEq/L Final  . CO2 08/28/2014 25  22 - 29 mEq/L Final  . Glucose 08/28/2014 95  70 - 140 mg/dl Final  . BUN 08/28/2014 8.0  7.0 - 26.0 mg/dL Final  . Creatinine 08/28/2014 0.8  0.7 - 1.3 mg/dL Final  . Total Bilirubin 08/28/2014 0.30  0.20 - 1.20 mg/dL Final  . Alkaline Phosphatase 08/28/2014 94  40 - 150 U/L Final  . AST 08/28/2014 14  5 - 34 U/L Final  . ALT 08/28/2014 7  0 - 55 U/L Final  . Total Protein 08/28/2014 6.4  6.4 - 8.3 g/dL Final  . Albumin 08/28/2014 3.1* 3.5 - 5.0 g/dL Final  . Calcium 08/28/2014 8.4  8.4 - 10.4 mg/dL Final  . Anion Gap 08/28/2014 6  3 - 11 mEq/L Final  . EGFR 08/28/2014 >90  >90 ml/min/1.73 m2 Final   eGFR is calculated using the CKD-EPI Creatinine Equation (2009)  . CEA 08/28/2014 10.7* 0.0 - 5.0 ng/mL Final     RADIOGRAPHIC STUDIES: No results found.  ASSESSMENT/PLAN:    Gastric cancer Patient presented to the [facility] today to receive cycle 25 of his FOLFOX chemotherapy.  He did develop a few hives only to his neck area during the oxaliplatin/leucovorin portion of his chemotherapy today.  Mild reaction was managed per hypersensitivity protocol; and patient was able to complete his  chemotherapy today.  Patient has plans to return to the [facility] for cycle 26 of the same regimen on 09/11/2014.    Hypersensitivity reaction Patient was in the midst of receiving the oxaliplatin/leucovorin portion of his FOLFOX chemotherapy; and developed onset of mild hive lesions to his neck only.  Infusion was held.  Patient denied any chest pain, chest pressure, shortness of breath, or other issues.  Patient managing airway and secretions well.  Vital signs remained stable throughout.  Patient was given Benadryl 25 mg IV, Pepcid 20 mg IV, and soulmedrol 125 mg IV per hypersensitivity protocol.  All symptoms did essentially resolve; and patient was able to complete his chemotherapy as previously directed.  Interpreter with pt during visit.     Patient stated understanding of all instructions; and was in agreement with this plan of care. The patient knows to call the clinic with any problems, questions or concerns.   Review/collaboration with Dr. Benay Spice regarding all aspects of patient's visit today.   Total time spent with patient was 25 minutes;  with greater than 75 percent of that time spent in face to face counseling regarding patient's symptoms,  and coordination of care and follow up.  Disclaimer: This note was dictated with voice recognition software. Similar sounding words can inadvertently be transcribed and may not be corrected upon review.   Drue Second, NP 08/29/2014

## 2014-08-29 NOTE — Telephone Encounter (Signed)
Called to follow up with pt after Selena Lesser, Np saw pt for reaction  In tx room. Phone line was busy. Will try again this afternoon.

## 2014-08-29 NOTE — Assessment & Plan Note (Signed)
Patient presented to the [facility] today to receive cycle 25 of his FOLFOX chemotherapy.  He did develop a few hives only to his neck area during the oxaliplatin/leucovorin portion of his chemotherapy today.  Mild reaction was managed per hypersensitivity protocol; and patient was able to complete his chemotherapy today.  Patient has plans to return to the [facility] for cycle 26 of the same regimen on 09/11/2014.

## 2014-08-30 ENCOUNTER — Ambulatory Visit (HOSPITAL_BASED_OUTPATIENT_CLINIC_OR_DEPARTMENT_OTHER): Payer: BLUE CROSS/BLUE SHIELD

## 2014-08-30 VITALS — BP 136/79 | HR 81 | Temp 98.9°F | Resp 18

## 2014-08-30 DIAGNOSIS — C169 Malignant neoplasm of stomach, unspecified: Secondary | ICD-10-CM | POA: Diagnosis not present

## 2014-08-30 DIAGNOSIS — Z95828 Presence of other vascular implants and grafts: Secondary | ICD-10-CM

## 2014-08-30 DIAGNOSIS — Z452 Encounter for adjustment and management of vascular access device: Secondary | ICD-10-CM

## 2014-08-30 MED ORDER — HEPARIN SOD (PORK) LOCK FLUSH 100 UNIT/ML IV SOLN
500.0000 [IU] | Freq: Once | INTRAVENOUS | Status: AC
  Administered 2014-08-30: 500 [IU] via INTRAVENOUS
  Filled 2014-08-30: qty 5

## 2014-08-30 MED ORDER — SODIUM CHLORIDE 0.9 % IJ SOLN
10.0000 mL | INTRAMUSCULAR | Status: DC | PRN
  Administered 2014-08-30: 10 mL via INTRAVENOUS
  Filled 2014-08-30: qty 10

## 2014-08-30 NOTE — Telephone Encounter (Signed)
Spoke to pt in Flush room 2- Pt no longer has any issues with Hives. He states the benadryl resolved the hives. Denies any itching or red raised patches while Home Infusion was administered.

## 2014-08-30 NOTE — Patient Instructions (Signed)
Fluorouracil, 5FU; Diclofenac topical cream What is this medicine? FLUOROURACIL; DICLOFENAC (flure oh YOOR a sil; dye KLOE fen ak) is a combination of a topical chemotherapy agent and non-steroidal anti-inflammatory drug (NSAID). It is used on the skin to treat skin cancer and skin conditions that could become cancer. This medicine may be used for other purposes; ask your health care provider or pharmacist if you have questions. COMMON BRAND NAME(S): FLUORAC What should I tell my health care provider before I take this medicine? They need to know if you have any of these conditions: -bleeding problems -cigarette smoker -DPD enzyme deficiency -heart disease -high blood pressure -if you frequently drink alcohol containing drinks -kidney disease -liver disease -open or infected skin -stomach problems -swelling or open sores at the treatment site -recent or planned coronary artery bypass graft (CABG) surgery -an unusual or allergic reaction to fluorouracil, diclofenac, aspirin, other NSAIDs, other medicines, foods, dyes, or preservatives -pregnant or trying to get pregnant -breast-feeding How should I use this medicine? This medicine is only for use on the skin. Follow the directions on the prescription label. Wash hands before and after use. Wash affected area and gently pat dry. To apply this medicine use a cotton-tipped applicator, or use gloves if applying with fingertips. If applied with unprotected fingertips, it is very important to wash your hands well after you apply this medicine. Avoid applying to the eyes, nose, or mouth. Apply enough medicine to cover the affected area. You can cover the area with a light gauze dressing, but do not use tight or air-tight dressings. Finish the full course prescribed by your doctor or health care professional, even if you think your condition is better. Do not stop taking except on the advice of your doctor or health care professional. Talk to your  pediatrician regarding the use of this medicine in children. Special care may be needed. Overdosage: If you think you've taken too much of this medicine contact a poison control center or emergency room at once. Overdosage: If you think you have taken too much of this medicine contact a poison control center or emergency room at once. NOTE: This medicine is only for you. Do not share this medicine with others. What if I miss a dose? If you miss a dose, apply it as soon as you can. If it is almost time for your next dose, only use that dose. Do not apply extra doses. Contact your doctor or health care professional if you miss more than one dose. What may interact with this medicine? Interactions are not expected. Do not use any other skin products without telling your doctor or health care professional. This list may not describe all possible interactions. Give your health care provider a list of all the medicines, herbs, non-prescription drugs, or dietary supplements you use. Also tell them if you smoke, drink alcohol, or use illegal drugs. Some items may interact with your medicine. What should I watch for while using this medicine? Visit your doctor or health care professional for checks on your progress. You will need to use this medicine for 2 to 6 weeks. This may be longer depending on the condition being treated. You may not see full healing for another 1 to 2 months after you stop using the medicine. Treated areas of skin can look unsightly during and for several weeks after treatment with this medicine. This medicine can make you more sensitive to the sun. Keep out of the sun. If you cannot avoid being in   the sun, wear protective clothing and use sunscreen. Do not use sun lamps or tanning beds/booths. Where should I keep my What side effects may I notice from receiving this medicine? Side effects that you should report to your doctor or health care professional as soon as possible: -allergic  reactions like skin rash, itching or hives, swelling of the face, lips, or tongue -black or bloody stools, blood in the urine or vomit -blurred vision -chest pain -difficulty breathing or wheezing -redness, blistering, peeling or loosening of the skin, including inside the mouth -severe redness and swelling of normal skin -slurred speech or weakness on one side of the body -trouble passing urine or change in the amount of urine -unexplained weight gain or swelling -unusually weak or tired -yellowing of eyes or skin Side effects that usually do not require medical attention (Report these to your doctor or health care professional if they continue or are bothersome.): -increased sensitivity of the skin to sun and ultraviolet light -pain and burning of the affected area -scaling or swelling of the affected area -skin rash, itching of the affected area -tenderness This list may not describe all possible side effects. Call your doctor for medical advice about side effects. You may report side effects to FDA at 1-800-FDA-1088. Where should I keep my medicine? Keep out of the reach of children. Store at room temperature between 20 and 25 degrees C (68 and 77 degrees F). Throw away any unused medicine after the expiration date. NOTE: This sheet is a summary. It may not cover all possible information. If you have questions about this medicine, talk to your doctor, pharmacist, or health care provider.  2015, [employer]vier/Gold Standard. (2013-07-09 11:09:58)  

## 2014-09-11 ENCOUNTER — Ambulatory Visit: Payer: Self-pay

## 2014-09-11 ENCOUNTER — Telehealth: Payer: Self-pay | Admitting: Nurse Practitioner

## 2014-09-11 ENCOUNTER — Ambulatory Visit (HOSPITAL_BASED_OUTPATIENT_CLINIC_OR_DEPARTMENT_OTHER): Payer: BLUE CROSS/BLUE SHIELD | Admitting: Nurse Practitioner

## 2014-09-11 ENCOUNTER — Other Ambulatory Visit (HOSPITAL_BASED_OUTPATIENT_CLINIC_OR_DEPARTMENT_OTHER): Payer: BLUE CROSS/BLUE SHIELD

## 2014-09-11 VITALS — BP 109/68 | HR 72 | Temp 98.4°F | Resp 18 | Ht 63.0 in | Wt 159.3 lb

## 2014-09-11 DIAGNOSIS — D509 Iron deficiency anemia, unspecified: Secondary | ICD-10-CM

## 2014-09-11 DIAGNOSIS — C169 Malignant neoplasm of stomach, unspecified: Secondary | ICD-10-CM

## 2014-09-11 DIAGNOSIS — C786 Secondary malignant neoplasm of retroperitoneum and peritoneum: Secondary | ICD-10-CM | POA: Diagnosis not present

## 2014-09-11 LAB — COMPREHENSIVE METABOLIC PANEL (CC13)
ALT: 10 U/L (ref 0–55)
AST: 13 U/L (ref 5–34)
Albumin: 3.2 g/dL — ABNORMAL LOW (ref 3.5–5.0)
Alkaline Phosphatase: 90 U/L (ref 40–150)
Anion Gap: 5 mEq/L (ref 3–11)
BILIRUBIN TOTAL: 0.31 mg/dL (ref 0.20–1.20)
BUN: 8.9 mg/dL (ref 7.0–26.0)
CHLORIDE: 110 meq/L — AB (ref 98–109)
CO2: 27 mEq/L (ref 22–29)
CREATININE: 0.8 mg/dL (ref 0.7–1.3)
Calcium: 8.6 mg/dL (ref 8.4–10.4)
Glucose: 111 mg/dl (ref 70–140)
Potassium: 3.8 mEq/L (ref 3.5–5.1)
Sodium: 142 mEq/L (ref 136–145)
TOTAL PROTEIN: 6.5 g/dL (ref 6.4–8.3)

## 2014-09-11 LAB — CBC WITH DIFFERENTIAL/PLATELET
BASO%: 0.8 % (ref 0.0–2.0)
Basophils Absolute: 0 10*3/uL (ref 0.0–0.1)
EOS ABS: 0.2 10*3/uL (ref 0.0–0.5)
EOS%: 4.7 % (ref 0.0–7.0)
HEMATOCRIT: 30.4 % — AB (ref 38.4–49.9)
HEMOGLOBIN: 9 g/dL — AB (ref 13.0–17.1)
LYMPH#: 1 10*3/uL (ref 0.9–3.3)
LYMPH%: 27.1 % (ref 14.0–49.0)
MCH: 22.4 pg — ABNORMAL LOW (ref 27.2–33.4)
MCHC: 29.6 g/dL — ABNORMAL LOW (ref 32.0–36.0)
MCV: 75.8 fL — ABNORMAL LOW (ref 79.3–98.0)
MONO#: 0.5 10*3/uL (ref 0.1–0.9)
MONO%: 13.2 % (ref 0.0–14.0)
NEUT%: 54.2 % (ref 39.0–75.0)
NEUTROS ABS: 2 10*3/uL (ref 1.5–6.5)
Platelets: 148 10*3/uL (ref 140–400)
RBC: 4.01 10*6/uL — ABNORMAL LOW (ref 4.20–5.82)
RDW: 20.2 % — AB (ref 11.0–14.6)
WBC: 3.7 10*3/uL — ABNORMAL LOW (ref 4.0–10.3)

## 2014-09-11 LAB — CEA: CEA: 10.1 ng/mL — ABNORMAL HIGH (ref 0.0–5.0)

## 2014-09-11 NOTE — Progress Notes (Signed)
[facility] OFFICE PROGRESS NOTE   Diagnosis:  Gastric cancer  INTERVAL HISTORY:   Joseph Salinas returns as scheduled. He completed cycle 11 FOLFOX 08/28/2014. He developed hives at the upper chest during the oxaliplatin infusion. The infusion was held temporarily and Benadryl was administered. The hives resolved and the infusion was restarted and completed.  He feels well. When he had the hives he did not have shortness breath, chest pain, chest tightness. He denies nausea/vomiting. No mouth sores. No diarrhea. He denies abdominal pain. No dysphagia. He has mild numbness in the fingertips. He has a good appetite.  Objective:  Vital signs in last 24 hours:  Blood pressure 109/68, pulse 72, temperature 98.4 F (36.9 C), temperature source Oral, resp. rate 18, height 5' 3"  (1.6 m), weight 159 lb 4.8 oz (72.258 kg), SpO2 100 %.    HEENT: No thrush or ulcers. Lymphatics: No palpable cervical or supra-clavicular lymph nodes. Resp: Lungs clear bilaterally. Cardio: Regular rate and rhythm. GI: Abdomen soft and nontender. No hepatomegaly. No mass. Vascular: No leg edema. Neuro: Vibratory sense intact to very mildly decreased over the fingertips per tuning fork exam.  Port-A-Cath without erythema.    Lab Results:  Lab Results  Component Value Date   WBC 3.7* 09/11/2014   HGB 9.0* 09/11/2014   HCT 30.4* 09/11/2014   MCV 75.8* 09/11/2014   PLT 148 09/11/2014   NEUTROABS 2.0 09/11/2014    Imaging:  No results found.  Medications: I have reviewed the patient's current medications.  Assessment/Plan: 1.Metastatic gastric cancer-status post biopsy of a gastric mass on 07/20/2012 with the pathology confirming adenocarcinoma, HER-2 non-amplified.  - CT 07/18/2012 consistent with a primary gastric mass and metastatic omental/peritoneal nodules  -biopsy of a peritoneal nodule 07/31/2012 consistent with metastatic adenocarcinoma  -Cycle 1 of FOLFOX on 08/02/2012   -Restaging CT 10/23/2012 consistent with a partial response-decrease in the primary gastric tumor and omental/peritoneal implants  -cycle 10 of FOLFOX completed 01/11/2013  -normal CEA 01/11/2013  -Restaging CT 01/23/2013 with improvement in the peritoneal metastases and an increased size of an indeterminate splenic lesion  -Initiation of maintenance Xeloda therapy on a 7 day on/7 day off schedule 01/29/2013.  -Xeloda dose reduced to 1000 mg twice daily 7 days on/7 days off beginning 02/26/2013 due to nausea and diarrhea.  -Xeloda dose reduced to 500 mg twice daily, 7 days/14 days off 03/20/2013.  -Xeloda discontinued 04/25/2013 due to progressive hand foot syndrome.  -CEA elevated at 9.1 on 04/25/2013.  -CEA stable at 9.5 on 05/09/2013.  -Initiation of infusional 5-FU as per the FOLFOX regimen 05/09/2013.  -CEA 7.4 on 05/24/2013.  -CEA 7.9 on 06/21/2013.  -CEA 9.3 on 08/22/2013 .  -CEA 11.1 on 10/01/2013.  -Restaging CT 10/01/2013 with persistent asymmetric gastric wall thickening in the greater curvature of the mid stomach. Dominant index gastrocolic lymph node decreased in size. Other smaller perigastric lymph nodes stable. No new or progressive findings in or around the stomach. Low-density subcapsular splenic lesion resolved.  -Continuation of infusional 5-fluorouracil, switched to a 4 week schedule beginning 01/02/2014 -CT 03/26/2014 with enlargement of the gastric mass and a possible new liver lesion -FOLFOX chemotherapy resumed 03/27/2014 -Restaging CT scan 06/03/2014 with mild decrease in volume of gastric wall mass. Decrease in volume of perigastric metastatic lymph node. Stable adrenal nodularity. No evidence of disease progression. -Cycle 6 FOLFOX 06/05/2014 - cycle 7 FOLFOX 06/19/2014 -Cycle 8 FOLFOX 07/03/2014 -Cycle 9 FOLFOX 07/17/2014 -Cycle 10 FOLFOX 07/31/2014 -Restaging CT 08/20/2014 revealed stable  gastric wall thickening and a slight decrease in the  size of a tumor nodule anterior to the stomach -Cycle 11 FOLFOX 08/28/2014 3. Multiple colon polyps documented on a colonoscopy 06/22/2012-tubular adenomas, tubulovillous adenomas, and hyperplastic polyps  4. Family history of colon cancer (mother). Status post genetics evaluation. Negative genetic testing on the Lynch/High Risk colon panel test  5. Pain secondary to the primary gastric mass or carcinomatosis -resolved  6. 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 Enrique Sack, multiple extractions were recommended  7. Anorexia, nausea, and early satiety-improved  8. 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 Nausea following chemotherapy-improved with the addition of prophylactic Decadron  10. Skin rash-most likely secondary to Decadron, improved  11. History of neutropenia secondary to chemotherapy  12. 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 Nausea and diarrhea while treated with Xeloda. Improved.  15. Hand-foot syndrome 03/07/2013 manifesting with erythema, dry desquamation and pain on the palms and erythema and pain on the soles. Progressive hand pain 04/25/2013. Xeloda discontinued.  16. Anemia-microcytic; chronic, stable. Ferritin low at 6 11/21/2013. Oral iron initiated. 17. Delayed nausea following cycle 1 FOLFOX. Aloxi added beginning with cycle 2 04/10/2014.  18. Rash and pruritus during the oxaliplatin infusion with cycle 3 FOLFOX, improved with Benadryl, Solu-Medrol, and Pepcid, no associated symptoms, no rash with cycle 4 FOLFOX 05/08/2014 19. Hives during the oxaliplatin infusion with cycle 11. Resolved with Benadryl. Infusion was completed.   Disposition: [name] appears well. He has completed 11 cycles of FOLFOX. The plan was to  complete 12 cycles and then begin a treatment break. He would like to forego today's treatment. He will return for a follow-up visit, labs and Port-A-Cath flush in 4 weeks. He will contact the office in  the interim with any problems.  Plan reviewed with Dr. Benay Spice.    Ned Card ANP/GNP-BC   09/11/2014  9:44 AM

## 2014-09-11 NOTE — Telephone Encounter (Signed)
per pof to sh pt appt-gave pt avs

## 2014-10-09 ENCOUNTER — Telehealth: Payer: Self-pay | Admitting: Oncology

## 2014-10-09 ENCOUNTER — Other Ambulatory Visit: Payer: Self-pay

## 2014-10-09 ENCOUNTER — Other Ambulatory Visit (HOSPITAL_BASED_OUTPATIENT_CLINIC_OR_DEPARTMENT_OTHER): Payer: BLUE CROSS/BLUE SHIELD

## 2014-10-09 ENCOUNTER — Ambulatory Visit (HOSPITAL_BASED_OUTPATIENT_CLINIC_OR_DEPARTMENT_OTHER): Payer: BLUE CROSS/BLUE SHIELD | Admitting: Oncology

## 2014-10-09 ENCOUNTER — Ambulatory Visit: Payer: BLUE CROSS/BLUE SHIELD

## 2014-10-09 VITALS — BP 127/70 | HR 78 | Temp 98.3°F | Resp 18 | Ht 63.0 in | Wt 161.0 lb

## 2014-10-09 DIAGNOSIS — D509 Iron deficiency anemia, unspecified: Secondary | ICD-10-CM | POA: Diagnosis not present

## 2014-10-09 DIAGNOSIS — Z452 Encounter for adjustment and management of vascular access device: Secondary | ICD-10-CM

## 2014-10-09 DIAGNOSIS — C786 Secondary malignant neoplasm of retroperitoneum and peritoneum: Secondary | ICD-10-CM

## 2014-10-09 DIAGNOSIS — C169 Malignant neoplasm of stomach, unspecified: Secondary | ICD-10-CM | POA: Diagnosis not present

## 2014-10-09 DIAGNOSIS — Z95828 Presence of other vascular implants and grafts: Secondary | ICD-10-CM

## 2014-10-09 LAB — CBC WITH DIFFERENTIAL/PLATELET
BASO%: 1.3 % (ref 0.0–2.0)
Basophils Absolute: 0.1 10*3/uL (ref 0.0–0.1)
EOS ABS: 0.2 10*3/uL (ref 0.0–0.5)
EOS%: 4.1 % (ref 0.0–7.0)
HCT: 30.2 % — ABNORMAL LOW (ref 38.4–49.9)
HGB: 9.1 g/dL — ABNORMAL LOW (ref 13.0–17.1)
LYMPH#: 1.4 10*3/uL (ref 0.9–3.3)
LYMPH%: 24.2 % (ref 14.0–49.0)
MCH: 21.3 pg — ABNORMAL LOW (ref 27.2–33.4)
MCHC: 30.1 g/dL — AB (ref 32.0–36.0)
MCV: 70.8 fL — ABNORMAL LOW (ref 79.3–98.0)
MONO#: 0.8 10*3/uL (ref 0.1–0.9)
MONO%: 13.3 % (ref 0.0–14.0)
NEUT%: 57.1 % (ref 39.0–75.0)
NEUTROS ABS: 3.4 10*3/uL (ref 1.5–6.5)
PLATELETS: 231 10*3/uL (ref 140–400)
RBC: 4.27 10*6/uL (ref 4.20–5.82)
RDW: 20.4 % — AB (ref 11.0–14.6)
WBC: 5.9 10*3/uL (ref 4.0–10.3)

## 2014-10-09 MED ORDER — HEPARIN SOD (PORK) LOCK FLUSH 100 UNIT/ML IV SOLN
500.0000 [IU] | Freq: Once | INTRAVENOUS | Status: AC
  Administered 2014-10-09: 500 [IU] via INTRAVENOUS
  Filled 2014-10-09: qty 5

## 2014-10-09 MED ORDER — SODIUM CHLORIDE 0.9 % IJ SOLN
10.0000 mL | INTRAMUSCULAR | Status: DC | PRN
  Administered 2014-10-09: 10 mL via INTRAVENOUS
  Filled 2014-10-09: qty 10

## 2014-10-09 NOTE — Telephone Encounter (Signed)
per pof to sch pt appt-gave pt copy of avs °

## 2014-10-09 NOTE — Patient Instructions (Signed)

## 2014-10-09 NOTE — Progress Notes (Signed)
[facility] OFFICE PROGRESS NOTE   Diagnosis: Gastric cancer  INTERVAL HISTORY:   Joseph Salinas returns as scheduled. He feels well. Good appetite and energy level. No nausea. No abdominal pain. He continues to have mild numbness in the hands, but this has improved.  Objective:  Vital signs in last 24 hours:  Blood pressure 127/70, pulse 78, temperature 98.3 F (36.8 C), temperature source Oral, resp. rate 18, height 5' 3"  (1.6 m), weight 161 lb (73.029 kg), SpO2 100 %.    HEENT: Mild thrush versus leukoplakia at the anterior right buccal mucosa Resp: Lungs clear bilaterally Cardio: Regular rate and rhythm GI: No hepatomegaly, nontender, no mass Vascular: No leg edema  Skin: Mild hyperpigmentation of the hands   Portacath/PICC-without erythema  Lab Results:  Lab Results  Component Value Date   WBC 5.9 10/09/2014   HGB 9.1* 10/09/2014   HCT 30.2* 10/09/2014   MCV 70.8* 10/09/2014   PLT 231 10/09/2014   NEUTROABS 3.4 10/09/2014     Lab Results  Component Value Date   CEA 10.1* 09/11/2014    Medications: I have reviewed the patient's current medications.  Assessment/Plan: 1.Metastatic gastric cancer-status post biopsy of a gastric mass on 07/20/2012 with the pathology confirming adenocarcinoma, HER-2 non-amplified.  - CT 07/18/2012 consistent with a primary gastric mass and metastatic omental/peritoneal nodules  -biopsy of a peritoneal nodule 07/31/2012 consistent with metastatic adenocarcinoma  -Cycle 1 of FOLFOX on 08/02/2012  -Restaging CT 10/23/2012 consistent with a partial response-decrease in the primary gastric tumor and omental/peritoneal implants  -cycle 10 of FOLFOX completed 01/11/2013  -normal CEA 01/11/2013  -Restaging CT 01/23/2013 with improvement in the peritoneal metastases and an increased size of an indeterminate splenic lesion  -Initiation of maintenance Xeloda therapy on a 7 day on/7 day off schedule 01/29/2013.   -Xeloda dose reduced to 1000 mg twice daily 7 days on/7 days off beginning 02/26/2013 due to nausea and diarrhea.  -Xeloda dose reduced to 500 mg twice daily, 7 days/14 days off 03/20/2013.  -Xeloda discontinued 04/25/2013 due to progressive hand foot syndrome.  -CEA elevated at 9.1 on 04/25/2013.  -CEA stable at 9.5 on 05/09/2013.  -Initiation of infusional 5-FU as per the FOLFOX regimen 05/09/2013.  -CEA 7.4 on 05/24/2013.  -CEA 7.9 on 06/21/2013.  -CEA 9.3 on 08/22/2013 .  -CEA 11.1 on 10/01/2013.  -Restaging CT 10/01/2013 with persistent asymmetric gastric wall thickening in the greater curvature of the mid stomach. Dominant index gastrocolic lymph node decreased in size. Other smaller perigastric lymph nodes stable. No new or progressive findings in or around the stomach. Low-density subcapsular splenic lesion resolved.  -Continuation of infusional 5-fluorouracil, switched to a 4 week schedule beginning 01/02/2014 -CT 03/26/2014 with enlargement of the gastric mass and a possible new liver lesion -FOLFOX chemotherapy resumed 03/27/2014 -Restaging CT scan 06/03/2014 with mild decrease in volume of gastric wall mass. Decrease in volume of perigastric metastatic lymph node. Stable adrenal nodularity. No evidence of disease progression. -Cycle 6 FOLFOX 06/05/2014 - cycle 7 FOLFOX 06/19/2014 -Cycle 8 FOLFOX 07/03/2014 -Cycle 9 FOLFOX 07/17/2014 -Cycle 10 FOLFOX 07/31/2014 -Restaging CT 08/20/2014 revealed stable gastric wall thickening and a slight decrease in the size of a tumor nodule anterior to the stomach -Cycle 11 FOLFOX 08/28/2014 3. Multiple colon polyps documented on a colonoscopy 06/22/2012-tubular adenomas, tubulovillous adenomas, and hyperplastic polyps  4. Family history of colon cancer (mother). Status post genetics evaluation. Negative genetic testing on the Lynch/High Risk colon panel test  5. Pain secondary to  the primary gastric mass or carcinomatosis  -resolved  6. 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 Enrique Sack, multiple extractions were recommended  7. Anorexia, nausea, and early satiety-improved  8. 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 Nausea following chemotherapy-improved with the addition of prophylactic Decadron  10. Skin rash-most likely secondary to Decadron, improved  11. History of neutropenia secondary to chemotherapy  12. 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 Nausea and diarrhea while treated with Xeloda. Improved.  15. Hand-foot syndrome 03/07/2013 manifesting with erythema, dry desquamation and pain on the palms and erythema and pain on the soles. Progressive hand pain 04/25/2013. Xeloda discontinued.  16. Anemia-microcytic; chronic, stable. Ferritin low at 6 11/21/2013. He will restart oral iron. 17. Delayed nausea following cycle 1 FOLFOX. Aloxi added beginning with cycle 2 04/10/2014.  18. Rash and pruritus during the oxaliplatin infusion with cycle 3 FOLFOX, improved with Benadryl, Solu-Medrol, and Pepcid, no associated symptoms, no rash with cycle 4 FOLFOX 05/08/2014 19. Hives during the oxaliplatin infusion with cycle 11. Resolved with Benadryl. Infusion was completed.     Disposition:  Joseph Salinas appears well. The plan is to follow him off of systemic therapy. He will return for an office and lab visit in one month. I encouraged him to resume iron therapy.  Betsy Coder, MD  10/09/2014  11:46 AM

## 2014-10-09 NOTE — Progress Notes (Signed)
Pt here today to have labs drawn and port flushed. Blood tinged saline returns from port after saline flush. No blood return. Labs drawn peripherally. Pt has been left accessed. Ubaldo Glassing, Dr. Gearldine Shown nurse notified. Order for heprin to be flushed into port and she will recheck and deaccess pt during OV.  Pt has been advised to make sure he does not leave this office without being deaccessed.

## 2014-10-10 LAB — CEA: CEA: 13.8 ng/mL — AB (ref 0.0–5.0)

## 2014-11-06 ENCOUNTER — Ambulatory Visit (HOSPITAL_BASED_OUTPATIENT_CLINIC_OR_DEPARTMENT_OTHER): Payer: BLUE CROSS/BLUE SHIELD | Admitting: Nurse Practitioner

## 2014-11-06 ENCOUNTER — Telehealth: Payer: Self-pay | Admitting: Nurse Practitioner

## 2014-11-06 ENCOUNTER — Other Ambulatory Visit (HOSPITAL_BASED_OUTPATIENT_CLINIC_OR_DEPARTMENT_OTHER): Payer: BLUE CROSS/BLUE SHIELD

## 2014-11-06 ENCOUNTER — Ambulatory Visit (HOSPITAL_BASED_OUTPATIENT_CLINIC_OR_DEPARTMENT_OTHER): Payer: BLUE CROSS/BLUE SHIELD

## 2014-11-06 VITALS — BP 120/84 | HR 71 | Temp 98.3°F | Resp 18 | Ht 63.0 in | Wt 159.8 lb

## 2014-11-06 DIAGNOSIS — Z452 Encounter for adjustment and management of vascular access device: Secondary | ICD-10-CM

## 2014-11-06 DIAGNOSIS — C169 Malignant neoplasm of stomach, unspecified: Secondary | ICD-10-CM

## 2014-11-06 DIAGNOSIS — C786 Secondary malignant neoplasm of retroperitoneum and peritoneum: Secondary | ICD-10-CM

## 2014-11-06 DIAGNOSIS — D509 Iron deficiency anemia, unspecified: Secondary | ICD-10-CM

## 2014-11-06 DIAGNOSIS — Z95828 Presence of other vascular implants and grafts: Secondary | ICD-10-CM

## 2014-11-06 LAB — CBC WITH DIFFERENTIAL/PLATELET
BASO%: 0.9 % (ref 0.0–2.0)
BASOS ABS: 0.1 10*3/uL (ref 0.0–0.1)
EOS%: 2.6 % (ref 0.0–7.0)
Eosinophils Absolute: 0.1 10*3/uL (ref 0.0–0.5)
HEMATOCRIT: 31.1 % — AB (ref 38.4–49.9)
HGB: 8.9 g/dL — ABNORMAL LOW (ref 13.0–17.1)
LYMPH#: 1.6 10*3/uL (ref 0.9–3.3)
LYMPH%: 29.8 % (ref 14.0–49.0)
MCH: 20.5 pg — AB (ref 27.2–33.4)
MCHC: 28.6 g/dL — AB (ref 32.0–36.0)
MCV: 71.5 fL — AB (ref 79.3–98.0)
MONO#: 0.7 10*3/uL (ref 0.1–0.9)
MONO%: 12 % (ref 0.0–14.0)
NEUT#: 3 10*3/uL (ref 1.5–6.5)
NEUT%: 54.7 % (ref 39.0–75.0)
PLATELETS: 215 10*3/uL (ref 140–400)
RBC: 4.35 10*6/uL (ref 4.20–5.82)
RDW: 18.1 % — ABNORMAL HIGH (ref 11.0–14.6)
WBC: 5.4 10*3/uL (ref 4.0–10.3)

## 2014-11-06 LAB — CEA: CEA: 13.9 ng/mL — ABNORMAL HIGH (ref 0.0–5.0)

## 2014-11-06 MED ORDER — HEPARIN SOD (PORK) LOCK FLUSH 100 UNIT/ML IV SOLN
500.0000 [IU] | Freq: Once | INTRAVENOUS | Status: AC
  Administered 2014-11-06: 500 [IU] via INTRAVENOUS
  Filled 2014-11-06: qty 5

## 2014-11-06 MED ORDER — SODIUM CHLORIDE 0.9 % IJ SOLN
10.0000 mL | INTRAMUSCULAR | Status: DC | PRN
  Administered 2014-11-06: 10 mL via INTRAVENOUS
  Filled 2014-11-06: qty 10

## 2014-11-06 NOTE — Telephone Encounter (Signed)
Gave avs & calendar for September °

## 2014-11-06 NOTE — Progress Notes (Addendum)
[place] OFFICE PROGRESS NOTE   Diagnosis:  Gastric cancer  INTERVAL HISTORY:   [name] returns as scheduled. He feels well. He notes improvement in the numbness in his fingertips. No nausea or vomiting. No dysphagia. He denies pain. Bowels moving regularly. He has a good appetite. He is currently taking one iron tablet a day.  Objective:  Vital signs in last 24 hours:  Blood pressure 120/84, pulse 71, temperature 98.3 F (36.8 C), temperature source Oral, resp. rate 18, height 5' 3"  (1.6 m), weight 159 lb 12.8 oz (72.485 kg), SpO2 100 %.    HEENT: Question leukoplakia right anterior buccal mucosa. Lymphatics: No palpable cervical, supraclavicular or axillary lymph nodes. Resp: Lungs clear bilaterally. Cardio: Regular rate and rhythm. GI: Abdomen soft and nontender. No hepatomegaly. Vascular: No leg edema. Port-A-Cath without erythema.  Lab Results:  Lab Results  Component Value Date   WBC 5.4 11/06/2014   HGB 8.9* 11/06/2014   HCT 31.1* 11/06/2014   MCV 71.5* 11/06/2014   PLT 215 11/06/2014   NEUTROABS 3.0 11/06/2014    Imaging:  No results found.  Medications: I have reviewed the patient's current medications.  Assessment/Plan: 1.Metastatic gastric cancer-status post biopsy of a gastric mass on 07/20/2012 with the pathology confirming adenocarcinoma, HER-2 non-amplified.  - CT 07/18/2012 consistent with a primary gastric mass and metastatic omental/peritoneal nodules  -biopsy of a peritoneal nodule 07/31/2012 consistent with metastatic adenocarcinoma  -Cycle 1 of FOLFOX on 08/02/2012  -Restaging CT 10/23/2012 consistent with a partial response-decrease in the primary gastric tumor and omental/peritoneal implants  -cycle 10 of FOLFOX completed 01/11/2013  -normal CEA 01/11/2013  -Restaging CT 01/23/2013 with improvement in the peritoneal metastases and an increased size of an indeterminate splenic lesion  -Initiation of maintenance  Xeloda therapy on a 7 day on/7 day off schedule 01/29/2013.  -Xeloda dose reduced to 1000 mg twice daily 7 days on/7 days off beginning 02/26/2013 due to nausea and diarrhea.  -Xeloda dose reduced to 500 mg twice daily, 7 days/14 days off 03/20/2013.  -Xeloda discontinued 04/25/2013 due to progressive hand foot syndrome.  -CEA elevated at 9.1 on 04/25/2013.  -CEA stable at 9.5 on 05/09/2013.  -Initiation of infusional 5-FU as per the FOLFOX regimen 05/09/2013.  -CEA 7.4 on 05/24/2013.  -CEA 7.9 on 06/21/2013.  -CEA 9.3 on 08/22/2013 .  -CEA 11.1 on 10/01/2013.  -Restaging CT 10/01/2013 with persistent asymmetric gastric wall thickening in the greater curvature of the mid stomach. Dominant index gastrocolic lymph node decreased in size. Other smaller perigastric lymph nodes stable. No new or progressive findings in or around the stomach. Low-density subcapsular splenic lesion resolved.  -Continuation of infusional 5-fluorouracil, switched to a 4 week schedule beginning 01/02/2014 -CT 03/26/2014 with enlargement of the gastric mass and a possible new liver lesion -FOLFOX chemotherapy resumed 03/27/2014 -Restaging CT scan 06/03/2014 with mild decrease in volume of gastric wall mass. Decrease in volume of perigastric metastatic lymph node. Stable adrenal nodularity. No evidence of disease progression. -Cycle 6 FOLFOX 06/05/2014 - cycle 7 FOLFOX 06/19/2014 -Cycle 8 FOLFOX 07/03/2014 -Cycle 9 FOLFOX 07/17/2014 -Cycle 10 FOLFOX 07/31/2014 -Restaging CT 08/20/2014 revealed stable gastric wall thickening and a slight decrease in the size of a tumor nodule anterior to the stomach -Cycle 11 FOLFOX 08/28/2014 3. Multiple colon polyps documented on a colonoscopy 06/22/2012-tubular adenomas, tubulovillous adenomas, and hyperplastic polyps  4. Family history of colon cancer (mother). Status post genetics evaluation. Negative genetic testing on the Lynch/High Risk colon panel test  5. Pain  secondary to the primary gastric mass or carcinomatosis -resolved  6. 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 Enrique Sack, multiple extractions were recommended  7. Anorexia, nausea, and early satiety-improved  8. 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 Nausea following chemotherapy-improved with the addition of prophylactic Decadron  10. Skin rash-most likely secondary to Decadron, improved  11. History of neutropenia secondary to chemotherapy  12. 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 Nausea and diarrhea while treated with Xeloda. Improved.  15. Hand-foot syndrome 03/07/2013 manifesting with erythema, dry desquamation and pain on the palms and erythema and pain on the soles. Progressive hand pain 04/25/2013. Xeloda discontinued.  16. Anemia-microcytic; chronic, stable. Ferritin low at 6 11/21/2013. He will increase oral iron to twice daily. 17. Delayed nausea following cycle 1 FOLFOX. Aloxi added beginning with cycle 2 04/10/2014.  18. Rash and pruritus during the oxaliplatin infusion with cycle 3 FOLFOX, improved with Benadryl, Solu-Medrol, and Pepcid, no associated symptoms, no rash with cycle 4 FOLFOX 05/08/2014 19. Hives during the oxaliplatin infusion with cycle 11. Resolved with Benadryl. Infusion was completed.   Disposition: Mr. Simonis appears well. There is no clinical evidence for disease progression. We will continue to follow with observation. He will return for a follow-up visit, labs and Port-A-Cath flush in 5 weeks. He will contact the office in the interim with any problems.  He will increase oral iron to twice daily.  Patient seen with Dr. Benay Spice.  Ned Card ANP/GNP-BC   11/06/2014  10:32 AM  This was a shared  visit with Ned Card. Mr. Copenhaver appears stable. We will continue to follow him with observation.  Julieanne Manson, M.D.

## 2014-11-06 NOTE — Patient Instructions (Signed)

## 2014-12-11 ENCOUNTER — Telehealth: Payer: Self-pay | Admitting: Oncology

## 2014-12-11 ENCOUNTER — Ambulatory Visit (HOSPITAL_BASED_OUTPATIENT_CLINIC_OR_DEPARTMENT_OTHER): Payer: Medicare Other

## 2014-12-11 ENCOUNTER — Ambulatory Visit (HOSPITAL_BASED_OUTPATIENT_CLINIC_OR_DEPARTMENT_OTHER): Payer: Medicare Other | Admitting: Oncology

## 2014-12-11 ENCOUNTER — Other Ambulatory Visit (HOSPITAL_BASED_OUTPATIENT_CLINIC_OR_DEPARTMENT_OTHER): Payer: Medicare Other

## 2014-12-11 VITALS — BP 120/68 | HR 78 | Temp 98.4°F | Resp 18 | Ht 63.0 in | Wt 154.6 lb

## 2014-12-11 DIAGNOSIS — C169 Malignant neoplasm of stomach, unspecified: Secondary | ICD-10-CM

## 2014-12-11 DIAGNOSIS — Z95828 Presence of other vascular implants and grafts: Secondary | ICD-10-CM

## 2014-12-11 LAB — CBC WITH DIFFERENTIAL/PLATELET
BASO%: 0.6 % (ref 0.0–2.0)
BASOS ABS: 0 10*3/uL (ref 0.0–0.1)
EOS ABS: 0.1 10*3/uL (ref 0.0–0.5)
EOS%: 1.4 % (ref 0.0–7.0)
HEMATOCRIT: 31.4 % — AB (ref 38.4–49.9)
HGB: 9.3 g/dL — ABNORMAL LOW (ref 13.0–17.1)
LYMPH#: 1.5 10*3/uL (ref 0.9–3.3)
LYMPH%: 22.9 % (ref 14.0–49.0)
MCH: 20.4 pg — ABNORMAL LOW (ref 27.2–33.4)
MCHC: 29.6 g/dL — AB (ref 32.0–36.0)
MCV: 68.9 fL — AB (ref 79.3–98.0)
MONO#: 0.5 10*3/uL (ref 0.1–0.9)
MONO%: 7.7 % (ref 0.0–14.0)
NEUT#: 4.5 10*3/uL (ref 1.5–6.5)
NEUT%: 67.4 % (ref 39.0–75.0)
PLATELETS: 255 10*3/uL (ref 140–400)
RBC: 4.56 10*6/uL (ref 4.20–5.82)
RDW: 17.9 % — ABNORMAL HIGH (ref 11.0–14.6)
WBC: 6.7 10*3/uL (ref 4.0–10.3)

## 2014-12-11 LAB — CEA: CEA: 13.8 ng/mL — ABNORMAL HIGH (ref 0.0–5.0)

## 2014-12-11 MED ORDER — SODIUM CHLORIDE 0.9 % IJ SOLN
10.0000 mL | INTRAMUSCULAR | Status: DC | PRN
  Administered 2014-12-11: 10 mL via INTRAVENOUS
  Filled 2014-12-11: qty 10

## 2014-12-11 MED ORDER — HEPARIN SOD (PORK) LOCK FLUSH 100 UNIT/ML IV SOLN
500.0000 [IU] | Freq: Once | INTRAVENOUS | Status: AC
  Administered 2014-12-11: 500 [IU] via INTRAVENOUS
  Filled 2014-12-11: qty 5

## 2014-12-11 NOTE — Progress Notes (Signed)
[place] OFFICE PROGRESS NOTE   Diagnosis: Gastric cancer  INTERVAL HISTORY:   [name] returns for a scheduled visit. He had one episode of nausea and diaphoresis last week. He has a good appetite and energy level. No other episodes of nausea. He is not taking iron.  Objective:  Vital signs in last 24 hours:  Blood pressure 120/68, pulse 78, temperature 98.4 F (36.9 C), temperature source Oral, resp. rate 18, height 5' 3"  (1.6 m), weight 154 lb 9.6 oz (70.126 kg), SpO2 100 %. Resp: Lungs clear bilaterally Cardio: Regular rate and rhythm GI: No hepatomegaly, no mass, nontender Vascular: No leg edema   Portacath/PICC-without erythema  Lab Results:  Lab Results  Component Value Date   WBC 6.7 12/11/2014   HGB 9.3* 12/11/2014   HCT 31.4* 12/11/2014   MCV 68.9* 12/11/2014   PLT 255 12/11/2014   NEUTROABS 4.5 12/11/2014     Lab Results  Component Value Date   CEA 13.8* 12/11/2014   Medications: I have reviewed the patient's current medications.  Assessment/Plan: 1.Metastatic gastric cancer-status post biopsy of a gastric mass on 07/20/2012 with the pathology confirming adenocarcinoma, HER-2 non-amplified.  - CT 07/18/2012 consistent with a primary gastric mass and metastatic omental/peritoneal nodules  -biopsy of a peritoneal nodule 07/31/2012 consistent with metastatic adenocarcinoma  -Cycle 1 of FOLFOX on 08/02/2012  -Restaging CT 10/23/2012 consistent with a partial response-decrease in the primary gastric tumor and omental/peritoneal implants  -cycle 10 of FOLFOX completed 01/11/2013  -normal CEA 01/11/2013  -Restaging CT 01/23/2013 with improvement in the peritoneal metastases and an increased size of an indeterminate splenic lesion  -Initiation of maintenance Xeloda therapy on a 7 day on/7 day off schedule 01/29/2013.  -Xeloda dose reduced to 1000 mg twice daily 7 days on/7 days off beginning 02/26/2013 due to nausea and diarrhea.   -Xeloda dose reduced to 500 mg twice daily, 7 days/14 days off 03/20/2013.  -Xeloda discontinued 04/25/2013 due to progressive hand foot syndrome.  -CEA elevated at 9.1 on 04/25/2013.  -CEA stable at 9.5 on 05/09/2013.  -Initiation of infusional 5-FU as per the FOLFOX regimen 05/09/2013.  -CEA 7.4 on 05/24/2013.  -CEA 7.9 on 06/21/2013.  -CEA 9.3 on 08/22/2013 .  -CEA 11.1 on 10/01/2013.  -Restaging CT 10/01/2013 with persistent asymmetric gastric wall thickening in the greater curvature of the mid stomach. Dominant index gastrocolic lymph node decreased in size. Other smaller perigastric lymph nodes stable. No new or progressive findings in or around the stomach. Low-density subcapsular splenic lesion resolved.  -Continuation of infusional 5-fluorouracil, switched to a 4 week schedule beginning 01/02/2014 -CT 03/26/2014 with enlargement of the gastric mass and a possible new liver lesion -FOLFOX chemotherapy resumed 03/27/2014 -Restaging CT scan 06/03/2014 with mild decrease in volume of gastric wall mass. Decrease in volume of perigastric metastatic lymph node. Stable adrenal nodularity. No evidence of disease progression. -Cycle 6 FOLFOX 06/05/2014 - cycle 7 FOLFOX 06/19/2014 -Cycle 8 FOLFOX 07/03/2014 -Cycle 9 FOLFOX 07/17/2014 -Cycle 10 FOLFOX 07/31/2014 -Restaging CT 08/20/2014 revealed stable gastric wall thickening and a slight decrease in the size of a tumor nodule anterior to the stomach -Cycle 11 FOLFOX 08/28/2014 3. Multiple colon polyps documented on a colonoscopy 06/22/2012-tubular adenomas, tubulovillous adenomas, and hyperplastic polyps  4. Family history of colon cancer (mother). Status post genetics evaluation. Negative genetic testing on the Lynch/High Risk colon panel test  5. Pain secondary to the primary gastric mass or carcinomatosis -resolved  6. 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 Enrique Sack, multiple  extractions were recommended  7. Anorexia, nausea, and early satiety-improved  8. 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 Nausea following chemotherapy-improved with the addition of prophylactic Decadron  10. Skin rash-most likely secondary to Decadron, improved  11. History of neutropenia secondary to chemotherapy  12. 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 Nausea and diarrhea while treated with Xeloda. Improved.  15. Hand-foot syndrome 03/07/2013 manifesting with erythema, dry desquamation and pain on the palms and erythema and pain on the soles. Progressive hand pain 04/25/2013. Xeloda discontinued.  16. Anemia-microcytic; chronic, stable. Ferritin low at 6 11/21/2013. He will resume iron therapy 17. Delayed nausea following cycle 1 FOLFOX. Aloxi added beginning with cycle 2 04/10/2014.  18. Rash and pruritus during the oxaliplatin infusion with cycle 3 FOLFOX, improved with Benadryl, Solu-Medrol, and Pepcid, no associated symptoms, no rash with cycle 4 FOLFOX 05/08/2014 19. Hives during the oxaliplatin infusion with cycle 11. Resolved with Benadryl. Infusion was completed.   Disposition:  He appears stable. The CEA is unchanged. The plan is to continue observation. He will return for an office visit and Port-A-Cath flush in 6 weeks. The Port-A-Cath was flushed today. I recommended he resume iron therapy. He will contact us for recurrent nausea.  Betsy Coder, MD  12/11/2014  8:26 PM

## 2014-12-11 NOTE — Patient Instructions (Signed)
Implanted Port Home Guide  An implanted port is a type of central line that is placed under the skin. Central lines are used to provide IV access when treatment or nutrition needs to be given through a person's veins. Implanted ports are used for long-term IV access. An implanted port may be placed because:    You need IV medicine that would be irritating to the small veins in your hands or arms.    You need long-term IV medicines, such as antibiotics.    You need IV nutrition for a long period.    You need frequent blood draws for lab tests.    You need dialysis.   Implanted ports are usually placed in the chest area, but they can also be placed in the upper arm, the abdomen, or the leg. An implanted port has two main parts:    Reservoir. The reservoir is round and will appear as a small, raised area under your skin. The reservoir is the part where a needle is inserted to give medicines or draw blood.    Catheter. The catheter is a thin, flexible tube that extends from the reservoir. The catheter is placed into a large vein. Medicine that is inserted into the reservoir goes into the catheter and then into the vein.   HOW WILL I CARE FOR MY INCISION SITE?  Do not get the incision site wet. Bathe or shower as directed by your health care Joseph Salinas.   HOW IS MY PORT ACCESSED?  Special steps must be taken to access the port:    Before the port is accessed, a numbing cream can be placed on the skin. This helps numb the skin over the port site.    Your health care Joseph Salinas uses a sterile technique to access the port.   Your health care Joseph Salinas must put on a mask and sterile gloves.   The skin over your port is cleaned carefully with an antiseptic and allowed to dry.   The port is gently pinched between sterile gloves, and a needle is inserted into the port.   Only "non-coring" port needles should be used to access the port. Once the port is accessed, a blood return should be checked. This helps  ensure that the port is in the vein and is not clogged.    If your port needs to remain accessed for a constant infusion, a clear (transparent) bandage will be placed over the needle site. The bandage and needle will need to be changed every week, or as directed by your health care Joseph Salinas.    Keep the bandage covering the needle clean and dry. Do not get it wet. Follow your health care Joseph Salinas's instructions on how to take a shower or bath while the port is accessed.    If your port does not need to stay accessed, no bandage is needed over the port.   WHAT IS FLUSHING?  Flushing helps keep the port from getting clogged. Follow your health care Joseph Salinas's instructions on how and when to flush the port. Ports are usually flushed with saline solution or a medicine called heparin. The need for flushing will depend on how the port is used.    If the port is used for intermittent medicines or blood draws, the port will need to be flushed:    After medicines have been given.    After blood has been drawn.    As part of routine maintenance.    If a constant infusion is   running, the port may not need to be flushed.   HOW LONG WILL MY PORT STAY IMPLANTED?  The port can stay in for as long as your health care Joseph Salinas thinks it is needed. When it is time for the port to come out, surgery will be done to remove it. The procedure is similar to the one performed when the port was put in.   WHEN SHOULD I SEEK IMMEDIATE MEDICAL CARE?  When you have an implanted port, you should seek immediate medical care if:    You notice a bad smell coming from the incision site.    You have swelling, redness, or drainage at the incision site.    You have more swelling or pain at the port site or the surrounding area.    You have a fever that is not controlled with medicine.  Document Released: 03/08/2005 Document Revised: 12/27/2012 Document Reviewed: 11/13/2012  Exit[employer] Patient Information 2015 [employer]. This  information is not intended to replace advice given to you by your health care Joseph Salinas. Make sure you discuss any questions you have with your health care Joseph Salinas.

## 2014-12-11 NOTE — Telephone Encounter (Signed)
Pt confirming labs/ov per 09/21 POF, gave pt AVS and Calendar... KJ

## 2014-12-13 ENCOUNTER — Telehealth: Payer: Self-pay

## 2014-12-13 NOTE — Telephone Encounter (Signed)
-----   Message from Domenic Schwab, RN sent at 12/13/2014  2:36 PM EDT -----   ----- Message -----    From: Ladell Pier, MD    Sent: 12/11/2014   7:37 PM      To: Adalberto Cole, RN, Brien Few, RN, #  Please call wife, cea is stable

## 2014-12-13 NOTE — Telephone Encounter (Signed)
Called and informed pt wife of stable cea results. Joseph Salinas verbalized understanding and denies any questions or concerns at this time.

## 2014-12-25 ENCOUNTER — Encounter: Payer: Self-pay | Admitting: Family Medicine

## 2014-12-25 ENCOUNTER — Ambulatory Visit (INDEPENDENT_AMBULATORY_CARE_PROVIDER_SITE_OTHER): Payer: Medicare Other | Admitting: Physician Assistant

## 2014-12-25 ENCOUNTER — Encounter: Payer: Self-pay | Admitting: Physician Assistant

## 2014-12-25 VITALS — BP 126/76 | HR 64 | Temp 98.2°F | Resp 18 | Ht 63.5 in | Wt 154.0 lb

## 2014-12-25 DIAGNOSIS — C169 Malignant neoplasm of stomach, unspecified: Secondary | ICD-10-CM | POA: Diagnosis not present

## 2014-12-25 DIAGNOSIS — Z86010 Personal history of colonic polyps: Secondary | ICD-10-CM | POA: Diagnosis not present

## 2014-12-25 DIAGNOSIS — K0530 Chronic periodontitis, unspecified: Secondary | ICD-10-CM

## 2014-12-25 DIAGNOSIS — Z800 Family history of malignant neoplasm of digestive organs: Secondary | ICD-10-CM

## 2014-12-25 DIAGNOSIS — Z7689 Persons encountering health services in other specified circumstances: Secondary | ICD-10-CM

## 2014-12-25 DIAGNOSIS — Z1379 Encounter for other screening for genetic and chromosomal anomalies: Secondary | ICD-10-CM

## 2014-12-25 DIAGNOSIS — Z7189 Other specified counseling: Secondary | ICD-10-CM | POA: Diagnosis not present

## 2014-12-25 DIAGNOSIS — Z315 Encounter for genetic counseling: Secondary | ICD-10-CM | POA: Diagnosis not present

## 2014-12-25 NOTE — Progress Notes (Signed)
Patient ID: Joseph Salinas MRN: [id number], DOB: [birth year], 54 y.o. Date of Encounter: @DATE @  Chief Complaint:  Chief Complaint  Patient presents with  . new pt est care    not CPE    HPI: 54 y.o. year old Hispanic male  presents with his wife and [facility] [employer] for visit to establish care.   Pt speaks minimal English but his wife speaks and understands [place] fluently. [facility] here for today's visit as well.  Wife reports that they scheduled today's visit because their insurance [facility]) said that he needed to have a PCP. She states that he has never had a primary care provider. He has only been going to the cancer doctor.  He has no concerns or issues that need to be addressed today.  They are interested in scheduling a complete physical exam and for him to come fasting to that visit. However, wife says that he is not interested in anything like the flu vaccine or anything just wants to treat his cancer. However when I asked -- even if cholesterol were high would he consider medication for this-- they did not answer directly, but then later said that he does want to schedule physical and does want to check cholesterol.   I reviewed his last oncology note which is dated 12/11/14. The first date mentioned in their list was 07/18/2012--- CT--consistent with a primary gastric mass and metastatic omental/peritoneal nodules 07/20/12---Biopsy/ pathology confirming adenocarcinoma The last CT scan I see listed in their note is 08/20/2014--CT revealed stable gastric wall thickening and a slight decrease in the size of a tumor nodule anterior to the stomach.  The oncology note also makes reference to dental abscess, multiple tooth extractions, periodontal disease.  Towards the end of our visit today, patient and wife also mentioned the fact that he has trouble eating because his dentures are not fitting but that they will not give him new dentures until he  is finished chemotherapy.  The oncology note also states "multiple colon polyps documented on colonoscopy 06/22/2012--- tubular adenomas, tubulovillous adenomas, and hyperplastic polyps." "Family history of colon cancer (mother). Status post genetics evaluation. Negative genetic testing on the colon panel test.   Past Medical History  Diagnosis Date  . Colon polyp   . Family history of colon cancer   . Gastric cancer ([place]) 08/09/2012  . Allergy      Home Meds: Outpatient Prescriptions Prior to Visit  Medication Sig Dispense Refill  . Alum & Mag Hydroxide-Simeth (MAGIC MOUTHWASH) SOLN Take 5 mLs by mouth every 4 (four) hours as needed for mouth pain (5cc every 4 hrs as needed, swish and spit). 300 mL 0  . docusate sodium (COLACE) 50 MG capsule Take 1 capsule (50 mg total) by mouth 2 (two) times daily. 60 capsule 3  . lidocaine-prilocaine (EMLA) cream Apply 1 application topically as needed. Apply 1 tsp to PAC site 1-2 hours prior to stick and cover with plastic wrap to numb site 30 g 3  . LORazepam (ATIVAN) 0.5 MG tablet Take 1 tablet (0.5 mg total) by mouth every 8 (eight) hours as needed for anxiety. Take for nausea every 8 hours as needed; may take dose at bedtime as needed for sleep. 30 tablet 0  . prochlorperazine (COMPAZINE) 10 MG tablet Take 1 tablet (10 mg total) by mouth every 6 (six) hours as needed (nausea). 30 tablet 1   No facility-administered medications prior to visit.    Allergies:  Allergies  Allergen Reactions  . Penicillins Rash    Social History   Social History  . Marital Status: Married    Spouse Name: [name]  . Number of Children: 2  . Years of Education: N/A   Occupational History  . Not on file.   Social History Main Topics  . Smoking status: Former Smoker -- 0.33 packs/day    Types: Cigarettes    Quit date: 06/20/2012  . Smokeless tobacco: Never Used  . Alcohol Use: No     Comment: not in 1 month;before that 6 beers on a weekend  . Drug Use: No    . Sexual Activity: Not on file   Other Topics Concern  . Not on file   Social History Narrative    Family History  Problem Relation Age of Onset  . Colon cancer Mother 46  . Cancer Mother   . Leukemia Daughter 6    deceased  . Colon cancer Maternal Grandmother      Review of Systems:  See HPI for pertinent ROS. All other ROS negative.    Physical Exam: Blood pressure 126/76, pulse 64, temperature 98.2 F (36.8 C), temperature source Oral, resp. rate 18, height 5' 3.5" (1.613 m), weight 154 lb (69.854 kg)., Body mass index is 26.85 kg/(m^2). General: WNWD Hispanic male. Appears in no acute distress. Neck: Supple. No thyromegaly. No lymphadenopathy. Lungs: Clear bilaterally to auscultation without wheezes, rales, or rhonchi. Breathing is unlabored. Heart: RRR with S1 S2. No murmurs, rubs, or gallops. Musculoskeletal:  Strength and tone normal for age. Extremities/Skin: Warm and dry. No edema.  Neuro: Alert and oriented X 3. Moves all extremities spontaneously. Gait is normal. CNII-XII grossly in tact. Psych:  Responds to questions appropriately with a normal affect.     ASSESSMENT AND PLAN:  54 y.o. year old male with  1. Encounter to establish care with new doctor  2. Malignant neoplasm of stomach, unspecified location ([place])  3. Chronic periodontitis  4. Family history of colon cancer  5. Genetic testing  6. Hx of adenomatous colonic polyps  At the end of today's visit, he and his wife scheduled an upcoming visit here for complete physical exam for early morning so that he can come fasting to that appointment. At that time we will do screening lab work. I checked immunizations section, there are no immunizations documented. Will have to get input from Oncology regarding this.    Signed, [address] [place], [place], [facility] 12/25/2014 3:35 PM

## 2014-12-26 ENCOUNTER — Telehealth: Payer: Self-pay | Admitting: *Deleted

## 2014-12-26 DIAGNOSIS — Z86010 Personal history of colonic polyps: Secondary | ICD-10-CM

## 2014-12-26 NOTE — Telephone Encounter (Signed)
Received call from wife at 443pm stating pt "isn't feeling well, but he will not admit it.  He can't sleep at night because of the pain, and he's been dizzy and nauseated". Denies shortness of breath, vomiting, or diarrhea. Requesting pt be seen in AM. Labs for CBC and CMET in, POF in for Cyndee Bacon to see pt. Pt is agreeable to come in despite denying symptoms.

## 2014-12-26 NOTE — Telephone Encounter (Signed)
Ok to schedule with [facility] 10/7

## 2014-12-27 ENCOUNTER — Other Ambulatory Visit (HOSPITAL_BASED_OUTPATIENT_CLINIC_OR_DEPARTMENT_OTHER): Payer: Medicare Other

## 2014-12-27 ENCOUNTER — Ambulatory Visit (HOSPITAL_BASED_OUTPATIENT_CLINIC_OR_DEPARTMENT_OTHER): Payer: Medicare Other | Admitting: Nurse Practitioner

## 2014-12-27 VITALS — BP 137/70 | HR 77 | Temp 98.2°F | Resp 20 | Ht 63.5 in | Wt 151.8 lb

## 2014-12-27 DIAGNOSIS — R112 Nausea with vomiting, unspecified: Secondary | ICD-10-CM | POA: Diagnosis not present

## 2014-12-27 DIAGNOSIS — C169 Malignant neoplasm of stomach, unspecified: Secondary | ICD-10-CM

## 2014-12-27 DIAGNOSIS — G4700 Insomnia, unspecified: Secondary | ICD-10-CM | POA: Diagnosis not present

## 2014-12-27 DIAGNOSIS — Z86010 Personal history of colonic polyps: Secondary | ICD-10-CM

## 2014-12-27 LAB — COMPREHENSIVE METABOLIC PANEL (CC13)
ALT: <9 U/L (ref 0–55)
AST: 11 U/L (ref 5–34)
Albumin: 3.4 g/dL — ABNORMAL LOW (ref 3.5–5.0)
Alkaline Phosphatase: 84 U/L (ref 40–150)
Anion Gap: 8 mEq/L (ref 3–11)
BUN: 9 mg/dL (ref 7.0–26.0)
CO2: 25 meq/L (ref 22–29)
Calcium: 9 mg/dL (ref 8.4–10.4)
Chloride: 107 mEq/L (ref 98–109)
Creatinine: 0.9 mg/dL (ref 0.7–1.3)
GLUCOSE: 126 mg/dL (ref 70–140)
POTASSIUM: 4.2 meq/L (ref 3.5–5.1)
SODIUM: 140 meq/L (ref 136–145)
Total Bilirubin: 0.45 mg/dL (ref 0.20–1.20)
Total Protein: 6.7 g/dL (ref 6.4–8.3)

## 2014-12-27 LAB — CBC WITH DIFFERENTIAL/PLATELET
BASO%: 0.9 % (ref 0.0–2.0)
BASOS ABS: 0.1 10*3/uL (ref 0.0–0.1)
EOS ABS: 0.1 10*3/uL (ref 0.0–0.5)
EOS%: 1.9 % (ref 0.0–7.0)
HCT: 33.9 % — ABNORMAL LOW (ref 38.4–49.9)
HGB: 10.2 g/dL — ABNORMAL LOW (ref 13.0–17.1)
LYMPH%: 23.8 % (ref 14.0–49.0)
MCH: 20.5 pg — AB (ref 27.2–33.4)
MCHC: 30.1 g/dL — ABNORMAL LOW (ref 32.0–36.0)
MCV: 68.3 fL — AB (ref 79.3–98.0)
MONO#: 0.6 10*3/uL (ref 0.1–0.9)
MONO%: 8 % (ref 0.0–14.0)
NEUT%: 65.4 % (ref 39.0–75.0)
NEUTROS ABS: 4.5 10*3/uL (ref 1.5–6.5)
Platelets: 326 10*3/uL (ref 140–400)
RBC: 4.96 10*6/uL (ref 4.20–5.82)
RDW: 22.9 % — ABNORMAL HIGH (ref 11.0–14.6)
WBC: 6.9 10*3/uL (ref 4.0–10.3)
lymph#: 1.6 10*3/uL (ref 0.9–3.3)

## 2014-12-29 ENCOUNTER — Encounter: Payer: Self-pay | Admitting: Nurse Practitioner

## 2014-12-29 DIAGNOSIS — G4700 Insomnia, unspecified: Secondary | ICD-10-CM | POA: Insufficient documentation

## 2014-12-29 DIAGNOSIS — R112 Nausea with vomiting, unspecified: Secondary | ICD-10-CM | POA: Insufficient documentation

## 2014-12-29 NOTE — Assessment & Plan Note (Signed)
Pt last received chemo in June 2016; and remains on observation only for now.   He is scheduled to return for labs and a visit on 01/22/15.

## 2014-12-29 NOTE — Assessment & Plan Note (Signed)
Pt reports mild nausea and 1 episode of vomiting within the past 24 hours. Pt feels that this is most likely related to the initiationof the iron tablets he started within last day or so. Advised pt to withhold the iron tablets to see if that helps. Pt confirmed that he has anti-nausea meds at home already.

## 2014-12-29 NOTE — Progress Notes (Signed)
SYMPTOM MANAGEMENT CLINIC   HPI: Joseph Salinas 54 y.o. male diagnosed with Gastric cancer.  Patient last received chemotherapy in June 2016.  Currently undergoing observation only.  Patient presented to the [facility] today with complaint of 24 hour history of nausea and vomiting.  Patient feels that these symptoms developed after he started taking iron tablets.  He is also reporting some insomnia for the past few nights.  Patient denies any other new symptoms whatsoever.  He denies any recent fevers or chills.   HPI  ROS  Past Medical History  Diagnosis Date  . Colon polyp   . Family history of colon cancer   . Gastric cancer ([place]) 08/09/2012  . Allergy     Past Surgical History  Procedure Laterality Date  . No prior surgery    . Colonoscopy    . Insertion of port a cath Right 08/08/2012  . Esophagogastroduodenoscopy endoscopy    . Biospy  07/31/12    gastric   . Multiple extractions with alveoloplasty N/A 11/10/2012    Procedure: extraction of tooth #'s 1, 2,10,14,15,16,17,18, 23, 24, 25, 26, 32 with alveoloplasty and gross debridement of remaining teeth;  Surgeon: Lenn Cal, DDS;  Location: [place];  Service: Oral Surgery;  Laterality: N/A;    has Hx of adenomatous colonic polyps; Gastric cancer ([place]); Chronic periodontitis; Hypersensitivity reaction; Family history of colon cancer; Genetic testing; Nausea with vomiting; and Insomnia on his problem list.    is allergic to penicillins.    Medication List       This list is accurate as of: 12/27/14 11:59 PM.  Always use your most recent med list.               docusate sodium 50 MG capsule  Commonly known as:  COLACE  Take 1 capsule (50 mg total) by mouth 2 (two) times daily.     lidocaine-prilocaine cream  Commonly known as:  EMLA  Apply 1 application topically as needed. Apply 1 tsp to PAC site 1-2 hours prior to stick and cover with plastic wrap to numb site     LORazepam 0.5 MG tablet  Commonly  known as:  ATIVAN  Take 1 tablet (0.5 mg total) by mouth every 8 (eight) hours as needed for anxiety. Take for nausea every 8 hours as needed; may take dose at bedtime as needed for sleep.     magic mouthwash Soln  Take 5 mLs by mouth every 4 (four) hours as needed for mouth pain (5cc every 4 hrs as needed, swish and spit).     prochlorperazine 10 MG tablet  Commonly known as:  COMPAZINE  Take 1 tablet (10 mg total) by mouth every 6 (six) hours as needed (nausea).         PHYSICAL EXAMINATION  Oncology Vitals 12/27/2014 12/25/2014 12/11/2014 11/06/2014 10/09/2014 09/11/2014 08/30/2014  Height 161 cm 161 cm 160 cm 160 cm 160 cm 160 cm -  Weight 68.856 kg 69.854 kg 70.126 kg 72.485 kg 73.029 kg 72.258 kg -  Weight (lbs) 151 lbs 13 oz 154 lbs 154 lbs 10 oz 159 lbs 13 oz 161 lbs 159 lbs 5 oz -  BMI (kg/m2) 26.47 kg/m2 26.85 kg/m2 27.39 kg/m2 28.31 kg/m2 28.52 kg/m2 28.22 kg/m2 -  Temp 98.2 98.2 98.4 98.3 98.3 98.4 98.9  Pulse 77 64 78 71 78 72 81  Resp 20 18 18 18 18 18 18   SpO2 100 - 100 100 100 100 100  BSA (  m2) 1.76 m2 1.77 m2 1.77 m2 1.8 m2 1.8 m2 1.79 m2 -   BP Readings from Last 3 Encounters:  12/27/14 137/70  12/25/14 126/76  12/11/14 120/68    Physical Exam  Constitutional: He is oriented to person, place, and time and well-developed, well-nourished, and in no distress.  HENT:  Head: Normocephalic and atraumatic.  Mouth/Throat: Oropharynx is clear and moist.  Eyes: Conjunctivae and EOM are normal. Pupils are equal, round, and reactive to light. Right eye exhibits no discharge. Left eye exhibits no discharge. No scleral icterus.  Neck: Normal range of motion. Neck supple. No JVD present. No tracheal deviation present. No thyromegaly present.  Cardiovascular: Normal rate, regular rhythm, normal heart sounds and intact distal pulses.   Pulmonary/Chest: Effort normal and breath sounds normal. No respiratory distress. He has no wheezes. He has no rales. He exhibits no tenderness.    Abdominal: Soft. Bowel sounds are normal. He exhibits no distension and no mass. There is no tenderness. There is no rebound and no guarding.  Musculoskeletal: Normal range of motion. He exhibits no edema or tenderness.  Lymphadenopathy:    He has no cervical adenopathy.  Neurological: He is alert and oriented to person, place, and time. Gait normal.  Skin: Skin is warm and dry. No rash noted. No erythema. No pallor.  Psychiatric: Affect normal.  Nursing note and vitals reviewed.   LABORATORY DATA:. Appointment on 12/27/2014  Component Date Value Ref Range Status  . WBC 12/27/2014 6.9  4.0 - 10.3 10e3/uL Final  . NEUT# 12/27/2014 4.5  1.5 - 6.5 10e3/uL Final  . HGB 12/27/2014 10.2* 13.0 - 17.1 g/dL Final  . HCT 12/27/2014 33.9* 38.4 - 49.9 % Final  . Platelets 12/27/2014 326  140 - 400 10e3/uL Final  . MCV 12/27/2014 68.3* 79.3 - 98.0 fL Final  . MCH 12/27/2014 20.5* 27.2 - 33.4 pg Final  . MCHC 12/27/2014 30.1* 32.0 - 36.0 g/dL Final  . RBC 12/27/2014 4.96  4.20 - 5.82 10e6/uL Final  . RDW 12/27/2014 22.9* 11.0 - 14.6 % Final  . lymph# 12/27/2014 1.6  0.9 - 3.3 10e3/uL Final  . MONO# 12/27/2014 0.6  0.1 - 0.9 10e3/uL Final  . Eosinophils Absolute 12/27/2014 0.1  0.0 - 0.5 10e3/uL Final  . Basophils Absolute 12/27/2014 0.1  0.0 - 0.1 10e3/uL Final  . NEUT% 12/27/2014 65.4  39.0 - 75.0 % Final  . LYMPH% 12/27/2014 23.8  14.0 - 49.0 % Final  . MONO% 12/27/2014 8.0  0.0 - 14.0 % Final  . EOS% 12/27/2014 1.9  0.0 - 7.0 % Final  . BASO% 12/27/2014 0.9  0.0 - 2.0 % Final  . Sodium 12/27/2014 140  136 - 145 mEq/L Final  . Potassium 12/27/2014 4.2  3.5 - 5.1 mEq/L Final  . Chloride 12/27/2014 107  98 - 109 mEq/L Final  . CO2 12/27/2014 25  22 - 29 mEq/L Final  . Glucose 12/27/2014 126  70 - 140 mg/dl Final   Glucose reference range is for nonfasting patients. Fasting glucose reference range is 70- 100.  Marland Kitchen BUN 12/27/2014 9.0  7.0 - 26.0 mg/dL Final  . Creatinine 12/27/2014 0.9  0.7 -  1.3 mg/dL Final  . Total Bilirubin 12/27/2014 0.45  0.20 - 1.20 mg/dL Final  . Alkaline Phosphatase 12/27/2014 84  40 - 150 U/L Final  . AST 12/27/2014 11  5 - 34 U/L Final  . ALT 12/27/2014 <9  0 - 55 U/L Final  . Total Protein  12/27/2014 6.7  6.4 - 8.3 g/dL Final  . Albumin 12/27/2014 3.4* 3.5 - 5.0 g/dL Final  . Calcium 12/27/2014 9.0  8.4 - 10.4 mg/dL Final  . Anion Gap 12/27/2014 8  3 - 11 mEq/L Final  . EGFR 12/27/2014 >90  >90 ml/min/1.73 m2 Final   eGFR is calculated using the CKD-EPI Creatinine Equation (2009)     RADIOGRAPHIC STUDIES: No results found.  ASSESSMENT/PLAN:    Gastric cancer Pt last received chemo in June 2016; and remains on observation only for now.   He is scheduled to return for labs and a visit on 01/22/15.   Nausea with vomiting Pt reports mild nausea and 1 episode of vomiting within the past 24 hours. Pt feels that this is most likely related to the initiationof the iron tablets he started within last day or so. Advised pt to withhold the iron tablets to see if that helps. Pt confirmed that he has anti-nausea meds at home already.   Insomnia Pt also reports insomnia for the past few nites. He is requesting medication to help him sleep.  Advised pt to try the Ativan at nite- which will help with both nausea and sleep. Advised pt he could take 2 tabs (total 1 mg) only at bedtime to help him sleep.   All of visit with translator present.   Patient stated understanding of all instructions; and was in agreement with this plan of care. The patient knows to call the clinic with any problems, questions or concerns.   Review/collaboration with Dr. Benay Spice regarding all aspects of patient's visit today.   Total time spent with patient was 25 minutes;  with greater than 75 percent of that time spent in face to face counseling regarding patient's symptoms,  and coordination of care and follow up.  Disclaimer:This dictation was prepared with Dragon/digital  dictation along with [employer]. Any transcriptional errors that result from this process are unintentional.  Drue Second, NP 12/29/2014

## 2014-12-29 NOTE — Assessment & Plan Note (Signed)
Pt also reports insomnia for the past few nites. He is requesting medication to help him sleep.  Advised pt to try the Ativan at nite- which will help with both nausea and sleep. Advised pt he could take 2 tabs (total 1 mg) only at bedtime to help him sleep.   All of visit with translator present.

## 2015-01-01 ENCOUNTER — Telehealth: Payer: Self-pay | Admitting: *Deleted

## 2015-01-01 DIAGNOSIS — C160 Malignant neoplasm of cardia: Secondary | ICD-10-CM

## 2015-01-01 MED ORDER — LORAZEPAM 0.5 MG PO TABS: 0.5000 mg | ORAL_TABLET | Freq: Three times a day (TID) | ORAL | Status: DC | PRN

## 2015-01-01 MED ORDER — PROCHLORPERAZINE MALEATE 10 MG PO TABS: 10.0000 mg | ORAL_TABLET | Freq: Four times a day (QID) | ORAL | Status: DC | PRN

## 2015-01-01 NOTE — Telephone Encounter (Signed)
TC from from pt's wife requesting refill on Lorazepam 0.5 mg (nausea and sleep-last filled 04/10/14) and prochlorperazine 10 mg (nausea-last filled 11/22/13). Pt has 1 prochlorperazine left and about 5-6 of the lorazepam.. Pt has ongoing issues with occasional nausea. Prochlorperazine escribed. Lorazepam phoned in to [place]

## 2015-01-20 ENCOUNTER — Ambulatory Visit (INDEPENDENT_AMBULATORY_CARE_PROVIDER_SITE_OTHER): Payer: Medicare Other | Admitting: Physician Assistant

## 2015-01-20 ENCOUNTER — Encounter: Payer: Self-pay | Admitting: Physician Assistant

## 2015-01-20 VITALS — BP 132/90 | HR 72 | Temp 98.0°F | Resp 18 | Ht 64.5 in | Wt 150.0 lb

## 2015-01-20 DIAGNOSIS — C169 Malignant neoplasm of stomach, unspecified: Secondary | ICD-10-CM | POA: Diagnosis not present

## 2015-01-20 DIAGNOSIS — Z800 Family history of malignant neoplasm of digestive organs: Secondary | ICD-10-CM | POA: Diagnosis not present

## 2015-01-20 DIAGNOSIS — G4700 Insomnia, unspecified: Secondary | ICD-10-CM | POA: Diagnosis not present

## 2015-01-20 DIAGNOSIS — Z0000 Encounter for general adult medical examination without abnormal findings: Secondary | ICD-10-CM | POA: Diagnosis not present

## 2015-01-20 DIAGNOSIS — Z86010 Personal history of colonic polyps: Secondary | ICD-10-CM | POA: Diagnosis not present

## 2015-01-20 DIAGNOSIS — K0530 Chronic periodontitis, unspecified: Secondary | ICD-10-CM | POA: Diagnosis not present

## 2015-01-20 LAB — CBC WITH DIFFERENTIAL/PLATELET
BASOS PCT: 1 % (ref 0–1)
Basophils Absolute: 0.1 10*3/uL (ref 0.0–0.1)
EOS PCT: 1 % (ref 0–5)
Eosinophils Absolute: 0.1 10*3/uL (ref 0.0–0.7)
HCT: 34.6 % — ABNORMAL LOW (ref 39.0–52.0)
Hemoglobin: 10.1 g/dL — ABNORMAL LOW (ref 13.0–17.0)
Lymphocytes Relative: 19 % (ref 12–46)
Lymphs Abs: 1.7 10*3/uL (ref 0.7–4.0)
MCH: 20.8 pg — ABNORMAL LOW (ref 26.0–34.0)
MCHC: 29.2 g/dL — AB (ref 30.0–36.0)
MCV: 71.3 fL — ABNORMAL LOW (ref 78.0–100.0)
MONO ABS: 0.7 10*3/uL (ref 0.1–1.0)
MPV: 8.6 fL (ref 8.6–12.4)
Monocytes Relative: 8 % (ref 3–12)
Neutro Abs: 6.4 10*3/uL (ref 1.7–7.7)
Neutrophils Relative %: 71 % (ref 43–77)
PLATELETS: 358 10*3/uL (ref 150–400)
RBC: 4.85 MIL/uL (ref 4.22–5.81)
RDW: 20.9 % — AB (ref 11.5–15.5)
WBC: 9 10*3/uL (ref 4.0–10.5)

## 2015-01-20 LAB — COMPLETE METABOLIC PANEL WITH GFR
ALT: 8 U/L — ABNORMAL LOW (ref 9–46)
AST: 11 U/L (ref 10–35)
Albumin: 3.7 g/dL (ref 3.6–5.1)
Alkaline Phosphatase: 72 U/L (ref 40–115)
BUN: 9 mg/dL (ref 7–25)
CALCIUM: 8.9 mg/dL (ref 8.6–10.3)
CO2: 28 mmol/L (ref 20–31)
Chloride: 103 mmol/L (ref 98–110)
Creat: 0.81 mg/dL (ref 0.70–1.33)
GFR, Est African American: >89 mL/min (ref >=60)
GFR, Est Non African American: >89 mL/min (ref >=60)
GLUCOSE: 99 mg/dL (ref 70–99)
POTASSIUM: 4.4 mmol/L (ref 3.5–5.3)
SODIUM: 139 mmol/L (ref 135–146)
Total Bilirubin: 0.3 mg/dL (ref 0.2–1.2)
Total Protein: 6.5 g/dL (ref 6.1–8.1)

## 2015-01-20 LAB — LIPID PANEL
CHOLESTEROL: 193 mg/dL (ref 125–200)
HDL: 39 mg/dL — ABNORMAL LOW (ref >=40)
LDL Cholesterol: 129 mg/dL (ref <130)
Total CHOL/HDL Ratio: 4.9 Ratio (ref <=5.0)
Triglycerides: 123 mg/dL (ref <150)
VLDL: 25 mg/dL (ref <30)

## 2015-01-20 LAB — TSH: TSH: 0.745 u[IU]/mL (ref 0.350–4.500)

## 2015-01-20 MED ORDER — ZOLPIDEM TARTRATE 10 MG PO TABS: 10.0000 mg | ORAL_TABLET | Freq: Every evening | ORAL | Status: DC | PRN

## 2015-01-20 NOTE — Progress Notes (Signed)
Patient ID: Joseph Salinas MRN: [id number], DOB: [birth year] 54 y.o. Date of Encounter: 01/20/2015, 9:49 AM    Chief Complaint: Physical (CPE)  HPI: 54 y.o. y/o male here for CPE.   HPI: 54 y.o. year old Hispanic male presents for CPE.  OV 12/25/2014: Pt speaks minimal English but his wife speaks and understands [place] fluently. [facility] Interpreter here for today's visit as well.  Wife reports that they scheduled today's visit because their insurance [employer]) said that he needed to have a PCP. She states that he has never had a primary care provider. He has only been going to the cancer doctor.  He has no concerns or issues that need to be addressed today.  They are interested in scheduling a complete physical exam and for him to come fasting to that visit. However, wife says that he is not interested in anything like the flu vaccine or anything just wants to treat his cancer. However when I asked -- even if cholesterol were high would he consider medication for this-- they did not answer directly, but then later said that he does want to schedule physical and does want to check cholesterol.   I reviewed his last oncology note which is dated 12/11/14. The first date mentioned in their list was 07/18/2012--- CT--consistent with a primary gastric mass and metastatic omental/peritoneal nodules 07/20/12---Biopsy/ pathology confirming adenocarcinoma The last CT scan I see listed in their note is 08/20/2014--CT revealed stable gastric wall thickening and a slight decrease in the size of a tumor nodule anterior to the stomach.  The oncology note also makes reference to dental abscess, multiple tooth extractions, periodontal disease.  Towards the end of our visit today, patient and wife also mentioned the fact that he has trouble eating because his dentures are not fitting but that they will not give him new dentures until he is finished chemotherapy.  The oncology note also  states "multiple colon polyps documented on colonoscopy 06/22/2012--- tubular adenomas, tubulovillous adenomas, and hyperplastic polyps." "Family history of colon cancer (mother). Status post genetics evaluation. Negative genetic testing on the colon panel test.   01/20/2015: At that initial visit, patient was interested in returning for complete physical exam and fasting lab work. He presents for this today. The only complaint/concern that they want to address today is that he has been having insomnia. Wife states that patient just lays in bed and cannot fall asleep. Reviewed that there is a note regarding insomnia dated 12/27/14. At that time they recommended that he take 2 Ativan at a time at night. Wife states that he has tried taking 2 of these together and still cannot fall asleep. She states that he has never used any other kind of medication for insomnia in the past.  Review of Systems: Consitutional: No fever, chills. Eyes: No visual changes, eye redness, or discharge. ENT/Mouth: Ears: No otalgia, tinnitus, hearing loss, discharge. Nose: No congestion, rhinorrhea, sinus pain, or epistaxis. Throat: No sore throat, post nasal drip. Cardiovascular: No CP, palpitations, diaphoresis, DOE, edema, orthopnea, PND. Respiratory: No cough, hemoptysis, SOB, or wheezing. Gastrointestinal: He has Gastric Cancer, managed by Oncology Genitourinary: No dysuria, frequency, urgency, hematuria, incontinence, nocturia, decreased urinary stream, discharge, impotence, or testicular pain/masses. Musculoskeletal: No decreased ROM, myalgias, stiffness, joint swelling, or weakness. Skin: No rash, erythema, lesion changes, pain, warmth, jaundice, or pruritis. Neurological: No headache, dizziness, syncope, seizures, tremors, memory loss, coordination problems, or paresthesias. Psychological: No anxiety, depression, hallucinations, SI/HI. Endocrine: No fatigue, polydipsia,  polyphagia, polyuria, or known diabetes. All  other systems were reviewed and are otherwise negative.  Past Medical History  Diagnosis Date  . Colon polyp   . Family history of colon cancer   . Gastric cancer ([place]) 08/09/2012  . Allergy      Past Surgical History  Procedure Laterality Date  . No prior surgery    . Colonoscopy    . Insertion of port a cath Right 08/08/2012  . Esophagogastroduodenoscopy endoscopy    . Biospy  07/31/12    gastric   . Multiple extractions with alveoloplasty N/A 11/10/2012    Procedure: extraction of tooth #'s 1, 2,10,14,15,16,17,18, 23, 24, 25, 26, 32 with alveoloplasty and gross debridement of remaining teeth;  Surgeon: Lenn Cal, DDS;  Location: [place];  Service: Oral Surgery;  Laterality: N/A;    Home Meds:  Outpatient Prescriptions Prior to Visit  Medication Sig Dispense Refill  . docusate sodium (COLACE) 50 MG capsule Take 1 capsule (50 mg total) by mouth 2 (two) times daily. 60 capsule 3  . lidocaine-prilocaine (EMLA) cream Apply 1 application topically as needed. Apply 1 tsp to PAC site 1-2 hours prior to stick and cover with plastic wrap to numb site 30 g 3  . LORazepam (ATIVAN) 0.5 MG tablet Take 1 tablet (0.5 mg total) by mouth every 8 (eight) hours as needed for anxiety. Take for nausea every 8 hours as needed; may take dose at bedtime as needed for sleep. 30 tablet 0  . prochlorperazine (COMPAZINE) 10 MG tablet Take 1 tablet (10 mg total) by mouth every 6 (six) hours as needed (nausea). 30 tablet 1  . Alum & Mag Hydroxide-Simeth (MAGIC MOUTHWASH) SOLN Take 5 mLs by mouth every 4 (four) hours as needed for mouth pain (5cc every 4 hrs as needed, swish and spit). 300 mL 0   No facility-administered medications prior to visit.    Allergies:  Allergies  Allergen Reactions  . Penicillins Rash    Social History   Social History  . Marital Status: Married    Spouse Name: [name]  . Number of Children: 2  . Years of Education: N/A   Occupational History  . Not on file.   Social  History Main Topics  . Smoking status: Former Smoker -- 0.33 packs/day    Types: Cigarettes    Quit date: 06/20/2012  . Smokeless tobacco: Never Used  . Alcohol Use: No     Comment: not in 1 month;before that 6 beers on a weekend  . Drug Use: No  . Sexual Activity: Not on file   Other Topics Concern  . Not on file   Social History Narrative    Family History  Problem Relation Age of Onset  . Colon cancer Mother 19  . Cancer Mother   . Leukemia Daughter 6    deceased  . Colon cancer Maternal Grandmother     Physical Exam: Blood pressure 132/90, pulse 72, temperature 98 F (36.7 C), temperature source Oral, resp. rate 18, height 5' 4.5" (1.638 m), weight 150 lb (68.04 kg).  General: Well developed, well nourished Hispanic Male. Appears in no acute distress. HEENT: Normocephalic, atraumatic. Conjunctiva pink, sclera non-icteric. Pupils 2 mm constricting to 1 mm, round, regular, and equally reactive to light and accomodation. EOMI. Internal auditory canal clear. TMs with good cone of light and without pathology. Nasal mucosa pink. Nares are without discharge. No sinus tenderness. Oral mucosa pink. He has had multiple teeth extracted. Marland Kitchen Pharynx without exudate.  Neck: Supple. Trachea midline. No thyromegaly. Full ROM. No lymphadenopathy.No carotid bruit. Lungs: Clear to auscultation bilaterally without wheezes, rales, or rhonchi. Breathing is of normal effort and unlabored. Cardiovascular: RRR with S1 S2. No murmurs, rubs, or gallops. Distal pulses 2+ symmetrically. No carotid or abdominal bruits. Abdomen: Soft, non-tender, non-distended with normoactive bowel sounds. No hepatosplenomegaly or masses. No rebound/guarding. No CVA tenderness. No hernias. Rectal: No external hemorrhoids or fissures. Rectal vault without masses. Prostate gland firm and smooth. No nodularity, tenderness, mass, or induration.  Musculoskeletal: Full range of motion and 5/5 strength throughout.  Skin: Warm and  moist without erythema, ecchymosis, wounds, or rash. Neuro: A+Ox3. CN II-XII grossly intact. Moves all extremities spontaneously. Full sensation throughout. Normal gait. DTR 2+ throughout upper and lower extremities.  Psych:  Responds to questions appropriately with a normal affect.   Assessment/Plan:  54 y.o. y/o Hispanic male here for CPE  -1. Visit for preventive health examination  A. Screening Labs: - CBC with Differential/Platelet - COMPLETE METABOLIC PANEL WITH GFR - Lipid panel - TSH - Vit D  25 hydroxy (rtn osteoporosis monitoring) - PSA, Medicare  B. Screening For Prostate Cancer: - PSA, Medicare  C. Screening For Colorectal Cancer:  Hx of adenomatous colonic polyps  Family history of colon cancer The oncology note also states "multiple colon polyps documented on colonoscopy 06/22/2012--- tubular adenomas, tubulovillous adenomas, and hyperplastic polyps." "Family history of colon cancer (mother). Status post genetics evaluation. Negative genetic testing on the colon panel test.  D. Immunizations: Today he refuses to receive any immunizations. I discussed that given his cancer and chemotherapy, his immune system is low and is especially high risk for getting influenza or pneumonia. Highly recommended he receive these vaccines. He deferred. Discussed that I would hate for him to end up in the hospital over influenza or pneumonia after all that he has gone through that he still defers. Flu Tetanus Pneumococcal Zostavax     2. Malignant neoplasm of stomach, unspecified location ([place])  3. Chronic periodontitis   4. Hx of adenomatous colonic polyps  5. Family history of colon cancer  6. Insomnia - zolpidem (AMBIEN) 10 MG tablet; Take 1 tablet (10 mg total) by mouth at bedtime as needed for sleep.  Dispense: 30 tablet; Refill: 2 Discussed with them for him to take this 30 minutes prior to planning to go to sleep. If this causes any adverse effects or is ineffective, then  call us.  Subjective:   Patient presents for Medicare Annual/Subsequent preventive examination.   Review Past Medical/Family/Social: These are all reviewed today updated and documented above and in epic   Risk Factors  Current exercise habits: Limited secondary to his cancer. Dietary issues discussed:  Has a lot of nausea and vomiting secondary to his cancer.  Cardiac risk factors: male.  Depression Screen  (Note: if answer to either of the following is "Yes", a more complete depression screening is indicated)  Over the past two weeks, have you felt down, depressed or hopeless? No Over the past two weeks, have you felt little interest or pleasure in doing things? No Have you lost interest or pleasure in daily life? No Do you often feel hopeless? No Do you cry easily over simple problems? No   Activities of Daily Living  In your present state of health, do you have any difficulty performing the following activities?:  Driving? No  Managing money? No  Feeding yourself? No  Getting from bed to chair? No  Climbing a flight  of stairs? No  Preparing food and eating?: No  Bathing or showering? No  Getting dressed: No  Getting to the toilet? No  Using the toilet:No  Moving around from place to place: No  In the past year have you fallen or had a near fall?:No  Are you sexually active? No  Do you have more than one partner? No   Hearing Difficulties: No  Do you often ask people to speak up or repeat themselves? No  Do you experience ringing or noises in your ears? No Do you have difficulty understanding soft or whispered voices? No  Do you feel that you have a problem with memory? No Do you often misplace items? No  Do you feel safe at home? Yes  Cognitive Testing  Alert? Yes Normal Appearance?Yes  Oriented to person? Yes Place? Yes  Time? Yes  Recall of three objects? Yes  Can perform simple calculations? Yes  Displays appropriate judgment?Yes  Can read the correct time  from a watch face?Yes   List the Names of Other Physician/Practitioners you currently use:  Oncology  Indicate any recent Medical Services you may have received from other than [facility] providers in the past year (date may be approximate).  None Screening Tests / Date-----These are documented above Colonoscopy                     Zostavax  Mammogram  Influenza Vaccine  Tetanus/tdap    Assessment:    Annual wellness medicare exam   Plan:    During the course of the visit the patient was educated and counseled about appropriate screening and preventive services including:  Screening mammography  Colorectal cancer screening  Shingles vaccine. Prescription given to that she can get the vaccine at the pharmacy or Medicare part D.  Screen + for depression. PHQ- 9 score of 12 (moderate depression). We discussed the options of counseling versus possibly a medication. I encouraged her strongly think about the counseling. She is going through some medical problems currently and her husband is as well Mrs. been very stressful for her. She says she will think about it. She does have Xanax to use as needed. Though she may benefit from an SSRI for her more depressive type symptoms but she wants to hold off at this time.  I aksed her to please have her cardioloist send records since we have none on file.  Diet review for nutrition referral? Yes ____ Not Indicated __x__  Patient Instructions (the written plan) was given to the patient.  Medicare Attestation  I have personally reviewed:  The patient's medical and social history  Their use of alcohol, tobacco or illicit drugs  Their current medications and supplements  The patient's functional ability including ADLs,fall risks, home safety risks, cognitive, and hearing and visual impairment  Diet and physical activities  Evidence for depression or mood disorders  The patient's weight, height, BMI, and visual acuity have been recorded in the chart. I  have made referrals, counseling, and provided education to the patient based on review of the above and I have provided the patient with a written personalized care plan for preventive services.       Signed:   [address] [place], [place]  01/20/2015 9:49 AM

## 2015-01-21 LAB — VITAMIN D 25 HYDROXY (VIT D DEFICIENCY, FRACTURES): VIT D 25 HYDROXY: 18 ng/mL — AB (ref 30–100)

## 2015-01-21 LAB — PSA, MEDICARE: PSA: 0.38 ng/mL (ref <=4.00)

## 2015-01-22 ENCOUNTER — Ambulatory Visit (HOSPITAL_BASED_OUTPATIENT_CLINIC_OR_DEPARTMENT_OTHER): Payer: Medicare Other | Admitting: Nurse Practitioner

## 2015-01-22 ENCOUNTER — Ambulatory Visit (HOSPITAL_BASED_OUTPATIENT_CLINIC_OR_DEPARTMENT_OTHER): Payer: Medicare Other

## 2015-01-22 ENCOUNTER — Telehealth: Payer: Self-pay | Admitting: Oncology

## 2015-01-22 ENCOUNTER — Other Ambulatory Visit (HOSPITAL_BASED_OUTPATIENT_CLINIC_OR_DEPARTMENT_OTHER): Payer: Medicare Other

## 2015-01-22 VITALS — BP 117/74 | HR 79 | Temp 98.0°F | Resp 18 | Ht 64.5 in | Wt 148.7 lb

## 2015-01-22 DIAGNOSIS — Z95828 Presence of other vascular implants and grafts: Secondary | ICD-10-CM

## 2015-01-22 DIAGNOSIS — C169 Malignant neoplasm of stomach, unspecified: Secondary | ICD-10-CM | POA: Diagnosis not present

## 2015-01-22 DIAGNOSIS — Z452 Encounter for adjustment and management of vascular access device: Secondary | ICD-10-CM

## 2015-01-22 DIAGNOSIS — D509 Iron deficiency anemia, unspecified: Secondary | ICD-10-CM

## 2015-01-22 DIAGNOSIS — G620 Drug-induced polyneuropathy: Secondary | ICD-10-CM

## 2015-01-22 DIAGNOSIS — M549 Dorsalgia, unspecified: Secondary | ICD-10-CM

## 2015-01-22 DIAGNOSIS — R110 Nausea: Secondary | ICD-10-CM

## 2015-01-22 DIAGNOSIS — C786 Secondary malignant neoplasm of retroperitoneum and peritoneum: Secondary | ICD-10-CM

## 2015-01-22 LAB — CBC WITH DIFFERENTIAL/PLATELET
BASO%: 0.5 % (ref 0.0–2.0)
Basophils Absolute: 0 10*3/uL (ref 0.0–0.1)
EOS%: 1 % (ref 0.0–7.0)
Eosinophils Absolute: 0.1 10*3/uL (ref 0.0–0.5)
HCT: 32.1 % — ABNORMAL LOW (ref 38.4–49.9)
HGB: 9.7 g/dL — ABNORMAL LOW (ref 13.0–17.1)
LYMPH%: 18.9 % (ref 14.0–49.0)
MCH: 21.4 pg — ABNORMAL LOW (ref 27.2–33.4)
MCHC: 30.2 g/dL — ABNORMAL LOW (ref 32.0–36.0)
MCV: 70.9 fL — ABNORMAL LOW (ref 79.3–98.0)
MONO#: 0.8 10*3/uL (ref 0.1–0.9)
MONO%: 10.4 % (ref 0.0–14.0)
NEUT#: 5.5 10*3/uL (ref 1.5–6.5)
NEUT%: 69.2 % (ref 39.0–75.0)
Platelets: 316 10*3/uL (ref 140–400)
RBC: 4.53 10*6/uL (ref 4.20–5.82)
RDW: 20.3 % — ABNORMAL HIGH (ref 11.0–14.6)
WBC: 8 10*3/uL (ref 4.0–10.3)
lymph#: 1.5 10*3/uL (ref 0.9–3.3)

## 2015-01-22 LAB — CEA: CEA: 28.7 ng/mL — ABNORMAL HIGH (ref 0.0–5.0)

## 2015-01-22 MED ORDER — PANTOPRAZOLE SODIUM 40 MG PO TBEC: 40.0000 mg | DELAYED_RELEASE_TABLET | Freq: Two times a day (BID) | ORAL | Status: DC

## 2015-01-22 MED ORDER — HEPARIN SOD (PORK) LOCK FLUSH 100 UNIT/ML IV SOLN
500.0000 [IU] | Freq: Once | INTRAVENOUS | Status: AC
  Administered 2015-01-22: 500 [IU] via INTRAVENOUS
  Filled 2015-01-22: qty 5

## 2015-01-22 MED ORDER — ONDANSETRON HCL 8 MG PO TABS: 8.0000 mg | ORAL_TABLET | Freq: Three times a day (TID) | ORAL | Status: DC | PRN

## 2015-01-22 MED ORDER — SODIUM CHLORIDE 0.9 % IJ SOLN
10.0000 mL | INTRAMUSCULAR | Status: DC | PRN
  Administered 2015-01-22: 10 mL via INTRAVENOUS
  Filled 2015-01-22: qty 10

## 2015-01-22 MED ORDER — HYDROCODONE-ACETAMINOPHEN 5-325 MG PO TABS: 1.0000 | ORAL_TABLET | Freq: Four times a day (QID) | ORAL | Status: DC | PRN

## 2015-01-22 NOTE — Patient Instructions (Signed)

## 2015-01-22 NOTE — Progress Notes (Addendum)
[place] OFFICE PROGRESS NOTE   Diagnosis:  Gastric cancer  INTERVAL HISTORY:   Joseph Salinas returns as scheduled. He is having consistent nausea. Compazine is not effective. Last week he vomited bright red blood. His wife estimates 1 teaspoon. No further bleeding. He denies any black stools. He reports a 3 week history of generalized back pain. He takes Advil with temporary relief. He denies dysphagia. Appetite varies.  Objective:  Vital signs in last 24 hours:  Blood pressure 117/74, pulse 79, temperature 98 F (36.7 C), temperature source Oral, resp. rate 18, height 5' 4.5" (1.638 m), weight 148 lb 11.2 oz (67.45 kg), SpO2 100 %.    HEENT: No thrush or ulcers. Lymphatics: No palpable cervical, supra clavicular or axillary lymph nodes. Resp: Lungs clear bilaterally. Cardio: Regular rate and rhythm. GI: Abdomen soft and nontender. No hepatomegaly. Vascular: No leg edema. Port-A-Cath without erythema.  Lab Results:  Lab Results  Component Value Date   WBC 8.0 01/22/2015   HGB 9.7* 01/22/2015   HCT 32.1* 01/22/2015   MCV 70.9* 01/22/2015   PLT 316 01/22/2015   NEUTROABS 5.5 01/22/2015    Imaging:  No results found.  Medications: I have reviewed the patient's current medications.  Assessment/Plan: 1.Metastatic gastric cancer-status post biopsy of a gastric mass on 07/20/2012 with the pathology confirming adenocarcinoma, HER-2 non-amplified.  - CT 07/18/2012 consistent with a primary gastric mass and metastatic omental/peritoneal nodules  -biopsy of a peritoneal nodule 07/31/2012 consistent with metastatic adenocarcinoma  -Cycle 1 of FOLFOX on 08/02/2012  -Restaging CT 10/23/2012 consistent with a partial response-decrease in the primary gastric tumor and omental/peritoneal implants  -cycle 10 of FOLFOX completed 01/11/2013  -normal CEA 01/11/2013  -Restaging CT 01/23/2013 with improvement in the peritoneal metastases and an increased size of  an indeterminate splenic lesion  -Initiation of maintenance Xeloda therapy on a 7 day on/7 day off schedule 01/29/2013.  -Xeloda dose reduced to 1000 mg twice daily 7 days on/7 days off beginning 02/26/2013 due to nausea and diarrhea.  -Xeloda dose reduced to 500 mg twice daily, 7 days/14 days off 03/20/2013.  -Xeloda discontinued 04/25/2013 due to progressive hand foot syndrome.  -CEA elevated at 9.1 on 04/25/2013.  -CEA stable at 9.5 on 05/09/2013.  -Initiation of infusional 5-FU as per the FOLFOX regimen 05/09/2013.  -CEA 7.4 on 05/24/2013.  -CEA 7.9 on 06/21/2013.  -CEA 9.3 on 08/22/2013 .  -CEA 11.1 on 10/01/2013.  -Restaging CT 10/01/2013 with persistent asymmetric gastric wall thickening in the greater curvature of the mid stomach. Dominant index gastrocolic lymph node decreased in size. Other smaller perigastric lymph nodes stable. No new or progressive findings in or around the stomach. Low-density subcapsular splenic lesion resolved.  -Continuation of infusional 5-fluorouracil, switched to a 4 week schedule beginning 01/02/2014 -CT 03/26/2014 with enlargement of the gastric mass and a possible new liver lesion -FOLFOX chemotherapy resumed 03/27/2014 -Restaging CT scan 06/03/2014 with mild decrease in volume of gastric wall mass. Decrease in volume of perigastric metastatic lymph node. Stable adrenal nodularity. No evidence of disease progression. -Cycle 6 FOLFOX 06/05/2014 - cycle 7 FOLFOX 06/19/2014 -Cycle 8 FOLFOX 07/03/2014 -Cycle 9 FOLFOX 07/17/2014 -Cycle 10 FOLFOX 07/31/2014 -Restaging CT 08/20/2014 revealed stable gastric wall thickening and a slight decrease in the size of a tumor nodule anterior to the stomach -Cycle 11 FOLFOX 08/28/2014 3. Multiple colon polyps documented on a colonoscopy 06/22/2012-tubular adenomas, tubulovillous adenomas, and hyperplastic polyps  4. Family history of colon cancer (mother). Status post genetics evaluation.  Negative genetic  testing on the Lynch/High Risk colon panel test  5. Pain secondary to the primary gastric mass or carcinomatosis -resolved  6. 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 Enrique Salinas, multiple extractions were recommended  7. Anorexia, nausea, and early satiety-improved  8. 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 Nausea following chemotherapy-improved with the addition of prophylactic Decadron  10. Skin rash-most likely secondary to Decadron, improved  11. History of neutropenia secondary to chemotherapy  12. 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 Nausea and diarrhea while treated with Xeloda. Improved.  15. Hand-foot syndrome 03/07/2013 manifesting with erythema, dry desquamation and pain on the palms and erythema and pain on the soles. Progressive hand pain 04/25/2013. Xeloda discontinued.  16. Anemia-microcytic; chronic, stable. Ferritin low at 6 11/21/2013. He will resume iron therapy 17. Delayed nausea following cycle 1 FOLFOX. Aloxi added beginning with cycle 2 04/10/2014.  18. Rash and pruritus during the oxaliplatin infusion with cycle 3 FOLFOX, improved with Benadryl, Solu-Medrol, and Pepcid, no associated symptoms, no rash with cycle 4 FOLFOX 05/08/2014 19. Hives during the oxaliplatin infusion with cycle 11. Resolved with Benadryl. Infusion was completed.     Disposition: Joseph Salinas is experiencing consistent nausea and has vomited blood on one occasion. He is also having consistent back pain. He and his wife understand we are concerned the gastric cancer is progressing.  We are referring him for restaging CT scans. For the pain he will try hydrocodone 5/325 one to 2 tablets every 6 hours as needed. For the nausea he will try Zofran 8  mg every 8 hours as needed. He will begin Protonix 40 mg twice a day. We instructed him to discontinue ibuprofen.  He will return for a follow-up visit 01/28/2015. He will contact the office prior to that visit with further problems.  Patient seen with Dr. Benay Spice. 25 minutes were spent face-to-face at today's visit with the majority of that time involved in counseling/coordination of care.    Ned Card ANP/GNP-BC   01/22/2015  10:35 AM  This was a shared visit with Ned Card. Joseph Salinas was interviewed and examined. I am concerned his symptoms are related to progressive gastric cancer. He will be referred for restaging CT scans and return for an office visit on 01/28/2015.  Julieanne Manson, M.D.

## 2015-01-22 NOTE — Telephone Encounter (Signed)
Gave and printed appt sched and avs for pt for NOV...gv barium °

## 2015-01-23 ENCOUNTER — Telehealth: Payer: Self-pay | Admitting: Family Medicine

## 2015-01-23 DIAGNOSIS — E559 Vitamin D deficiency, unspecified: Secondary | ICD-10-CM | POA: Insufficient documentation

## 2015-01-23 NOTE — Telephone Encounter (Signed)
Spoke to wife about lab results and provider recommendations

## 2015-01-23 NOTE — Telephone Encounter (Signed)
-----   Message from Orlena Sheldon, PA-C sent at 01/22/2015  7:56 AM EDT ----- FYI--he has cancer and sees oncology--therefore I am not addressing the CBC--Oncology has been managing this.  Tell patient that all other labs look good--- the only lab that is abnormal at all is vitamin D deficiency--- add this to problem list.--- Start over-the-counter vitamin D 1000 units daily--- add this to medicine list. All other labs are normal.

## 2015-01-28 ENCOUNTER — Telehealth: Payer: Self-pay | Admitting: Oncology

## 2015-01-28 ENCOUNTER — Encounter (HOSPITAL_COMMUNITY): Payer: Self-pay

## 2015-01-28 ENCOUNTER — Ambulatory Visit (HOSPITAL_BASED_OUTPATIENT_CLINIC_OR_DEPARTMENT_OTHER): Payer: Medicare Other | Admitting: Nurse Practitioner

## 2015-01-28 ENCOUNTER — Ambulatory Visit (HOSPITAL_COMMUNITY)
Admission: RE | Admit: 2015-01-28 | Discharge: 2015-01-28 | Disposition: A | Discharge status: Home / Self Care | Payer: Medicare Other | Source: Ambulatory Visit | Attending: Nurse Practitioner | Admitting: Nurse Practitioner

## 2015-01-28 VITALS — BP 122/73 | HR 84 | Temp 98.5°F | Resp 17 | Ht 64.5 in | Wt 148.5 lb

## 2015-01-28 DIAGNOSIS — C169 Malignant neoplasm of stomach, unspecified: Secondary | ICD-10-CM

## 2015-01-28 DIAGNOSIS — R590 Localized enlarged lymph nodes: Secondary | ICD-10-CM | POA: Insufficient documentation

## 2015-01-28 DIAGNOSIS — G620 Drug-induced polyneuropathy: Secondary | ICD-10-CM

## 2015-01-28 DIAGNOSIS — D509 Iron deficiency anemia, unspecified: Secondary | ICD-10-CM | POA: Diagnosis not present

## 2015-01-28 DIAGNOSIS — R160 Hepatomegaly, not elsewhere classified: Secondary | ICD-10-CM | POA: Insufficient documentation

## 2015-01-28 DIAGNOSIS — Z08 Encounter for follow-up examination after completed treatment for malignant neoplasm: Secondary | ICD-10-CM | POA: Diagnosis not present

## 2015-01-28 DIAGNOSIS — C786 Secondary malignant neoplasm of retroperitoneum and peritoneum: Secondary | ICD-10-CM | POA: Diagnosis not present

## 2015-01-28 MED ORDER — IOHEXOL 300 MG/ML  SOLN
100.0000 mL | Freq: Once | INTRAMUSCULAR | Status: AC | PRN
  Administered 2015-01-28: 100 mL via INTRAVENOUS

## 2015-01-28 NOTE — Telephone Encounter (Signed)
Gave and printed appt sched and avs for pt for NOV and DEC °

## 2015-01-28 NOTE — Progress Notes (Addendum)
[place] OFFICE PROGRESS NOTE   Diagnosis:  Gastric cancer  INTERVAL HISTORY:   Mr. Propes returns as scheduled. He continues to have nausea. He notes some improvement with Zofran. He has had a single episode of vomiting a small amount of bright red blood since his last visit. He is no longer having back pain. No dysphagia. Appetite is poor. He denies numbness or tingling in his hands or feet.  Objective:  Vital signs in last 24 hours:  Blood pressure 122/73, pulse 84, temperature 98.5 F (36.9 C), temperature source Oral, resp. rate 17, height 5' 4.5" (1.638 m), weight 148 lb 8 oz (67.359 kg), SpO2 100 %.    HEENT: No thrush or ulcers. Resp: Lungs clear bilaterally. Cardio: Regular rate and rhythm. GI: Abdomen soft and nontender. No hepatomegaly. No mass. Vascular: No leg edema. Port-A-Cath without erythema.   Lab Results:  Lab Results  Component Value Date   WBC 8.0 01/22/2015   HGB 9.7* 01/22/2015   HCT 32.1* 01/22/2015   MCV 70.9* 01/22/2015   PLT 316 01/22/2015   NEUTROABS 5.5 01/22/2015    Imaging:  Ct Chest W Contrast  01/28/2015  CLINICAL DATA:  Gastric cancer diagnosed in April 2014 status post completion of chemotherapy in July 2016, presenting for restaging. EXAM: CT CHEST, ABDOMEN, AND PELVIS WITH CONTRAST TECHNIQUE: Multidetector CT imaging of the chest, abdomen and pelvis was performed following the standard protocol during bolus administration of intravenous contrast. CONTRAST:  178m OMNIPAQUE IOHEXOL 300 MG/ML  SOLN COMPARISON:  08/20/2014 CT abdomen/pelvis. FINDINGS: CT CHEST FINDINGS Mediastinum/Nodes: Normal heart size. No pericardial fluid/thickening. Right internal jugular MediPort terminates at the cavoatrial junction. Great vessels are normal in course and caliber. No central pulmonary emboli. Normal visualized thyroid. Normal esophagus. No pathologically enlarged axillary, mediastinal or hilar lymph nodes. Lungs/Pleura: No  pneumothorax. No pleural effusion. No acute consolidative airspace disease, significant pulmonary nodules or lung masses. Musculoskeletal:  No aggressive appearing focal osseous lesions. CT ABDOMEN PELVIS FINDINGS Hepatobiliary: There is a new hypoechoic 2.9 x 2.8 cm segment 8 right liver lobe mass (series 2/image 40). There are greater than 5 scattered stable simple liver cysts throughout the liver, largest 2.4 cm in the segment 8 right liver lobe. A subcentimeter hypodense lesion in segment 2 of the left liver lobe (2/54) is stable since 07/18/2012 and probably a benign cyst. Normal gallbladder with no radiopaque cholelithiasis. No biliary ductal dilatation. Pancreas: Normal, with no mass or duct dilation. There is a 3.6 x 3.0 cm peritoneal tumor implant between the pancreatic tail and splenic hilum (2/58), increased from 1.4 x 1.2 cm. Spleen: Normal size. No mass. Adrenals/Urinary Tract: Normal adrenals. Normal kidneys with no hydronephrosis and no renal mass. Normal bladder. Stomach/Bowel: There is interval growth of the primary gastric neoplasm in the proximal body of the stomach along the greater curvature, measuring approximately 9.3 x 5.5 cm (2/55), increased from 8.3 x 3.3 cm. There is a new 2.2 cm tumor implant in the gastrocolic ligament (28/01. Normal caliber small bowel with no small bowel wall thickening. Normal appendix. Normal large bowel with no diverticulosis, large bowel wall thickening or pericolonic fat stranding. Vascular/Lymphatic: Normal caliber abdominal aorta. Patent portal, splenic, hepatic and renal veins. There are new mildly enlarged gastrohepatic ligament nodes, largest 1.4 cm (2/53). Reproductive: Stable mild prostatomegaly with nonspecific internal prostatic calcifications. Other: No pneumoperitoneum, ascites or focal fluid collection. Musculoskeletal: No aggressive appearing focal osseous lesions. IMPRESSION: 1. Local progression of the primary gastric tumor along the  greater  curvature in the proximal body of the stomach. 2. Progressive peritoneal tumor implants adjacent to the splenic hilum and in the gastrocolic ligament. 3. New gastrohepatic ligament lymphadenopathy, likely metastatic. 4. New 2.9 cm right liver lobe mass, likely a liver metastasis. 5. No evidence of metastatic disease in the chest or pelvis. Electronically Signed   By: Ilona Sorrel M.D.   On: 01/28/2015 09:05   Ct Abdomen Pelvis W Contrast  01/28/2015  CLINICAL DATA:  Gastric cancer diagnosed in April 2014 status post completion of chemotherapy in July 2016, presenting for restaging. EXAM: CT CHEST, ABDOMEN, AND PELVIS WITH CONTRAST TECHNIQUE: Multidetector CT imaging of the chest, abdomen and pelvis was performed following the standard protocol during bolus administration of intravenous contrast. CONTRAST:  162m OMNIPAQUE IOHEXOL 300 MG/ML  SOLN COMPARISON:  08/20/2014 CT abdomen/pelvis. FINDINGS: CT CHEST FINDINGS Mediastinum/Nodes: Normal heart size. No pericardial fluid/thickening. Right internal jugular MediPort terminates at the cavoatrial junction. Great vessels are normal in course and caliber. No central pulmonary emboli. Normal visualized thyroid. Normal esophagus. No pathologically enlarged axillary, mediastinal or hilar lymph nodes. Lungs/Pleura: No pneumothorax. No pleural effusion. No acute consolidative airspace disease, significant pulmonary nodules or lung masses. Musculoskeletal:  No aggressive appearing focal osseous lesions. CT ABDOMEN PELVIS FINDINGS Hepatobiliary: There is a new hypoechoic 2.9 x 2.8 cm segment 8 right liver lobe mass (series 2/image 40). There are greater than 5 scattered stable simple liver cysts throughout the liver, largest 2.4 cm in the segment 8 right liver lobe. A subcentimeter hypodense lesion in segment 2 of the left liver lobe (2/54) is stable since 07/18/2012 and probably a benign cyst. Normal gallbladder with no radiopaque cholelithiasis. No biliary ductal  dilatation. Pancreas: Normal, with no mass or duct dilation. There is a 3.6 x 3.0 cm peritoneal tumor implant between the pancreatic tail and splenic hilum (2/58), increased from 1.4 x 1.2 cm. Spleen: Normal size. No mass. Adrenals/Urinary Tract: Normal adrenals. Normal kidneys with no hydronephrosis and no renal mass. Normal bladder. Stomach/Bowel: There is interval growth of the primary gastric neoplasm in the proximal body of the stomach along the greater curvature, measuring approximately 9.3 x 5.5 cm (2/55), increased from 8.3 x 3.3 cm. There is a new 2.2 cm tumor implant in the gastrocolic ligament (24/68. Normal caliber small bowel with no small bowel wall thickening. Normal appendix. Normal large bowel with no diverticulosis, large bowel wall thickening or pericolonic fat stranding. Vascular/Lymphatic: Normal caliber abdominal aorta. Patent portal, splenic, hepatic and renal veins. There are new mildly enlarged gastrohepatic ligament nodes, largest 1.4 cm (2/53). Reproductive: Stable mild prostatomegaly with nonspecific internal prostatic calcifications. Other: No pneumoperitoneum, ascites or focal fluid collection. Musculoskeletal: No aggressive appearing focal osseous lesions. IMPRESSION: 1. Local progression of the primary gastric tumor along the greater curvature in the proximal body of the stomach. 2. Progressive peritoneal tumor implants adjacent to the splenic hilum and in the gastrocolic ligament. 3. New gastrohepatic ligament lymphadenopathy, likely metastatic. 4. New 2.9 cm right liver lobe mass, likely a liver metastasis. 5. No evidence of metastatic disease in the chest or pelvis. Electronically Signed   By: JIlona SorrelM.D.   On: 01/28/2015 09:05    Medications: I have reviewed the patient's current medications.  Assessment/Plan: 1.Metastatic gastric cancer-status post biopsy of a gastric mass on 07/20/2012 with the pathology confirming adenocarcinoma, HER-2 non-amplified.  - CT  07/18/2012 consistent with a primary gastric mass and metastatic omental/peritoneal nodules  -biopsy of a peritoneal nodule 07/31/2012 consistent with  metastatic adenocarcinoma  -Cycle 1 of FOLFOX on 08/02/2012  -Restaging CT 10/23/2012 consistent with a partial response-decrease in the primary gastric tumor and omental/peritoneal implants  -cycle 10 of FOLFOX completed 01/11/2013  -normal CEA 01/11/2013  -Restaging CT 01/23/2013 with improvement in the peritoneal metastases and an increased size of an indeterminate splenic lesion  -Initiation of maintenance Xeloda therapy on a 7 day on/7 day off schedule 01/29/2013.  -Xeloda dose reduced to 1000 mg twice daily 7 days on/7 days off beginning 02/26/2013 due to nausea and diarrhea.  -Xeloda dose reduced to 500 mg twice daily, 7 days/14 days off 03/20/2013.  -Xeloda discontinued 04/25/2013 due to progressive hand foot syndrome.  -CEA elevated at 9.1 on 04/25/2013.  -CEA stable at 9.5 on 05/09/2013.  -Initiation of infusional 5-FU as per the FOLFOX regimen 05/09/2013.  -CEA 7.4 on 05/24/2013.  -CEA 7.9 on 06/21/2013.  -CEA 9.3 on 08/22/2013 .  -CEA 11.1 on 10/01/2013.  -Restaging CT 10/01/2013 with persistent asymmetric gastric wall thickening in the greater curvature of the mid stomach. Dominant index gastrocolic lymph node decreased in size. Other smaller perigastric lymph nodes stable. No new or progressive findings in or around the stomach. Low-density subcapsular splenic lesion resolved.  -Continuation of infusional 5-fluorouracil, switched to a 4 week schedule beginning 01/02/2014 -CT 03/26/2014 with enlargement of the gastric mass and a possible new liver lesion -FOLFOX chemotherapy resumed 03/27/2014 -Restaging CT scan 06/03/2014 with mild decrease in volume of gastric wall mass. Decrease in volume of perigastric metastatic lymph node. Stable adrenal nodularity. No evidence of disease progression. -Cycle 6 FOLFOX  06/05/2014 - cycle 7 FOLFOX 06/19/2014 -Cycle 8 FOLFOX 07/03/2014 -Cycle 9 FOLFOX 07/17/2014 -Cycle 10 FOLFOX 07/31/2014 -Restaging CT 08/20/2014 revealed stable gastric wall thickening and a slight decrease in the size of a tumor nodule anterior to the stomach -Cycle 11 FOLFOX 08/28/2014 -Restaging CT evaluation 01/28/2015 with local progression of the primary gastric tumor along the greater curvature in the proximal body of the stomach. Progressive peritoneal tumor implants adjacent to the splenic hilum and in the gastrocolic ligament. New gastrohepatic ligament lymphadenopathy. New 2.9 cm right liver lobe mass. 3. Multiple colon polyps documented on a colonoscopy 06/22/2012-tubular adenomas, tubulovillous adenomas, and hyperplastic polyps  4. Family history of colon cancer (mother). Status post genetics evaluation. Negative genetic testing on the Lynch/High Risk colon panel test  5. Pain secondary to the primary gastric mass or carcinomatosis -resolved  6. 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 Enrique Sack, multiple extractions were recommended  7. Anorexia, nausea, and early satiety-improved  8. 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 Nausea following chemotherapy-improved with the addition of prophylactic Decadron  10. Skin rash-most likely secondary to Decadron, improved  11. History of neutropenia secondary to chemotherapy  12. 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 Nausea and diarrhea while treated with Xeloda. Improved.  15. Hand-foot syndrome 03/07/2013 manifesting with erythema, dry desquamation and pain on the palms and erythema and pain on the soles. Progressive hand pain 04/25/2013. Xeloda discontinued.  16. Anemia-microcytic;  chronic, stable. Ferritin low at 6 11/21/2013. He will resume iron therapy 17. Delayed nausea following cycle 1 FOLFOX. Aloxi added beginning with cycle 2 04/10/2014.  18. Rash and pruritus during the oxaliplatin infusion with cycle 3 FOLFOX, improved with Benadryl, Solu-Medrol, and Pepcid, no associated symptoms, no rash with cycle  4 FOLFOX 05/08/2014 19. Hives during the oxaliplatin infusion with cycle 11. Resolved with Benadryl. Infusion was completed.   Disposition: Mr. Rybacki has progressive metastatic gastric cancer. We reviewed the CT results with Mr. Ulbrich and his wife. Dr. Benay Spice recommends initiating treatment with Taxol and Ramucirumab. The Taxol will be given weekly 4. The Ramucirumab will be given every 2 weeks. We reviewed potential toxicities including myelosuppression, nausea, mouth sores, diarrhea or constipation, hair loss, allergic reaction. We discussed the potential for neuropathy with Taxol. We discussed potential toxicities associated with ramucirumab including allergic reaction, hypertension, blood clots, bowel perforation. Written information was provided in [place] and [place]. He is agreeable to proceed.  He will return to begin treatment 02/07/2015. We will see him in follow-up on 02/21/2015. He will contact the office the interim with any problems.  Patient seen with Dr. Benay Spice. 25 minutes were spent face-to-face at today's visit with the majority of that time involved in counseling/coordination of care.    Ned Card ANP/GNP-BC   01/28/2015  4:04 PM This was a shared visit with Ned Card. The restaging CT confirms disease progression. We discussed the CT findings with Mr. Lackey and his wife. Treatment will be resumed with Taxol/Ramucirumab.  Julieanne Manson, M.D.

## 2015-01-28 NOTE — Patient Instructions (Signed)
Ramucirumab injection Qu es este medicamento? RAMUCIRUMAB es un agente quimioteraputico. Se utiliza para tratar cncer de estmago, cncer de colon o de recto, o cncer de pulmn. Este [employer] un receptor de la protena especfica que se Firefighter en las clulas cancerosas y [place] clulas cancerosas. Este medicamento puede ser utilizado para otros usos; si tiene alguna pregunta consulte con su proveedor de atencin mdica o con su farmacutico. Qu le debo informar a mi profesional de la salud antes de tomar este medicamento? Necesita saber si usted presenta alguno de los siguientes problemas o situaciones: trastornos sanguneos cogulos sanguneos enfermedad cardiaca, incluyendo insuficiencia cardiaca, ataque cardiaco o dolor en el pecho (angina) alta presin sangunea infeccin (especialmente infecciones virales, como varicela o herpes) protena en la orina ciruga reciente derrame cerebral una reaccin alrgica o inusual al ramucirumab, a otros medicamentos, alimentos, colorantes o conservantes si est embarazada o buscando quedar embarazada si est amamantando a un beb Cmo debo utilizar este medicamento? Este medicamento se administra mediante infusin por va intravenosa. Lo administra un profesional de Technical sales engineer en un hospital o en un entorno clnico. Hable con su pediatra para informarse acerca del uso de este medicamento en nios. Puede requerir atencin especial. Sobredosis: Pngase en contacto inmediatamente con un centro toxicolgico o una sala de urgencia si usted cree que haya tomado demasiado medicamento. ATENCIN: [employer] es solo para usted. No comparta este medicamento con nadie. Qu sucede si me olvido de una dosis? Es importante no olvidar ninguna dosis. Informe a su mdico o a su profesional de la salud si no puede asistir a Photographer. Qu puede interactuar con este medicamento? No se han estudiado las interacciones. Puede ser  que esta lista no menciona todas las posibles interacciones. Informe a su profesional de [employer]	[place] de [employer] productos a base de hierbas, medicamentos de [place] o suplementos nutritivos que est tomando. Si usted fuma, consume bebidas alcohlicas o si utiliza drogas ilegales, indqueselo tambin a su profesional de [employer]	[place]. Algunas sustancias pueden interactuar con su medicamento. A qu debo estar atento al usar Coca[employer]Cola? Se supervisar su condicin atentamente mientras reciba este medicamento. Tendr que revisar su presin sangunea y [employer] de [place] y [place] mientras est tomando este medicamento. Se supervisar su estado de salud atentamente mientras reciba este medicamento. [employer] puede aumentar el riesgo de magulladuras o sangrado. Consulte a su mdico o a su profesional de la salud si observa sangrados inusuales. Este medicamento puede causar raramente 'perforacin gastrointestinal' (agujeros en el estmago, intestinos o colon), un efecto secundario severo que requieren ciruga para Psychiatric nurse. Este Biomedical engineer por lo menos 28 das despus de la [place] mayor y TEFL teacher de la ciruga debe ser totalmente curado. Consulte a su mdico antes de programar una ciruga o tratamiento dental mientras que est recibiendo [employer]. Hable con su mdico si usted ha tenido [place] recientemente o si tiene una herida que no ha curado. No se debe quedar embarazada mientras est tomando este medicamento o durante 3 meses despus de dejar de tomarlo. Las mujeres deben informar a su mdico si estn buscando quedar embarazadas o si creen que estn embarazadas. Existe la posibilidad de efectos secundarios graves a un beb sin nacer. Para ms informacin hable con su profesional de la salud o su farmacutico. Qu efectos secundarios puedo tener al utilizar este medicamento? Efectos secundarios que debe informar a su mdico o a Barrister's clerk de la salud tan  pronto como sea posible: reacciones alrgicas como erupcin cutnea, picazn o urticarias, problemas respiratorios, hinchazn de la cara, labios o lengua signos de infeccin - fiebre o escalofros, tos, dolor de Press photographer en el pecho u opresin en el pecho confusin mareos sensacin de desmayos o aturdimiento, cadas dolor abdominal severo nuseas, vmito severo signos y sntomas de sangrado, como heces de color oscuro o de aspecto alquitranado, heces con [place]; orina de color rojo o marrn oscuro, escupir sangre o material marrn que parece como granos de caf, manchas rojas en la piel, magulladuras o sangrado inusual del ojo, encas o nariz signos o sntomas de un cogulo sanguneo, como problemas respiratorio; cambios en la visin; dolor en el pecho; dolor de cabeza severo o repentino; dolor, hinchazn, calor en la pierna, dificultad para hablar; entumecimiento repentina de la cara, brazo o pierna sntomas de un derrame cerebral; cambios en la conciencia mental, incapacidad para hablar o mover un lado del cuerpo dificultad para caminar, mareos, prdida del equilibrio o coordinacin Efectos secundarios que, por lo general, no requieren atencin mdica (debe informarlos a su mdico o a su profesional de la salud si persisten o si son molestos): piel sudorosa, fra estreimiento diarrea dolor de cabeza nuseas, vmito dolor estomacal pulso cardiaco inusualmente lento cansancio o debilidad inusual Puede ser que esta lista no menciona todos los posibles efectos secundarios. Comunquese a su mdico por asesoramiento mdico [employer] Usted puede informar los efectos secundarios a la FDA por telfono al 1-800-FDA-1088. Dnde debo guardar mi medicina? Este medicamento se administra en hospitales o clnicas y no necesitar guardarlo en su domicilio. ATENCIN: Este folleto es un resumen. Puede ser que no cubra toda la posible informacin. Si usted tiene preguntas acerca de esta medicina,  consulte con su mdico, su farmacutico o su profesional de Technical sales engineer.    2016, Elsevier/Gold Standard. (2014-05-01 00:00:00) Paclitaxel injection Qu es este medicamento? El PACLITAXEL es un agente quimioteraputico. Este medicamento acta sobre las clulas que se dividen rpidamente, como las clulas cancerosas, y finalmente provoca la muerte de estas clulas. Se utiliza en el tratamiento del cncer de [place], mama y otros tipos de cncer. Este medicamento puede ser utilizado para otros usos; si tiene alguna pregunta consulte con su proveedor de atencin mdica o con su farmacutico. Qu le debo informar a mi profesional de la salud antes de tomar este medicamento? Necesita saber si usted presenta alguno de los siguientes problemas o situaciones: -trastornos sanguneos -pulso cardiaco irregular -infeccin (especialmente infecciones virales, como varicela o herpes) -enfermedad heptica -radioterapia reciente o continua -una reaccin alrgica o inusual al paclitaxel, alcohol, aceite de ricino polioxietilado, otros agentes quimioteraputicos, medicamentos, alimentos, colorantes o conservantes -si est embarazada o buscando quedar embarazada -si est amamantando a un beb Cmo debo utilizar este medicamento? Este medicamento se administra mediante infusin por va intravenosa. Lo administra un profesional de la salud calificado en un hospital o en un entorno clnico. Hable con su pediatra para informarse acerca del uso de este medicamento en nios. Puede requerir atencin especial. Sobredosis: Pngase en contacto inmediatamente con un centro toxicolgico o una sala de urgencia si usted cree que haya tomado demasiado medicamento. ATENCIN: [employer] es solo para usted. No comparta este medicamento con nadie. Qu sucede si me olvido de una dosis? Es importante no olvidar ninguna dosis. Informe a su mdico o a su profesional de la salud si no puede asistir a Photographer. Qu puede  interactuar con este medicamento? No tome esta medicina con ninguno  de los siguientes medicamentos: -disulfiram -metronidazol Esta medicina tambin puede interactuar con los siguientes medicamentos: -ciclosporina -diazepam -quetoconazol -medicamentos para incrementar los conteos sanguneos, tales como filgrastim, pegfilgrastim, sargramostim -otros agentes quimioteraputicos, tales como cisplatino, doxorrubicina, epirubicina, etopsido, teniposida, vincristina -quinidina -testosterona -vacunas -verapamilo Consulte a su mdico o a su profesional de la salud antes de tomar cualquiera de los siguientes medicamentos: -acetaminofeno -aspirina -ibuprofeno -quetoprofeno -naproxeno Puede ser que esta lista no menciona todas las posibles interacciones. Informe a su profesional de [employer]	[place] de [employer] productos a base de hierbas, medicamentos de [place] o suplementos nutritivos que est tomando. Si usted fuma, consume bebidas alcohlicas o si utiliza drogas ilegales, indqueselo tambin a su profesional de [employer]	[place]. Algunas sustancias pueden interactuar con su medicamento. A qu debo estar atento al usar Coca-Cola? Se supervisar su estado de salud atentamente mientras reciba este medicamento. Tendr que hacerse anlisis de sangre importantes mientras est tomando este medicamento. Este medicamento puede hacerle sentir un Nurse, mental health. Esto es normal ya que la quimioterapia afecta tanto a las clulas sanas como a las clulas cancerosas. Si presenta alguno de los [employer], infrmelos. Sin embargo, contine con el tratamiento aun si se siente enfermo, a menos que su mdico le indique que lo suspenda. Este medicamento puede causar Chief of Staff graves. Para reducir su riesgo necesitar tomar otro(s) medicamento(s) antes del tratamiento con este medicamento. En algunos casos, podr recibir [employer] para ayudarlo con los efectos secundarios. Siga las  instrucciones para usarlos. Consulte a su mdico o a su profesional de la salud si tiene fiebre, escalofros, dolor de garganta o cualquier otro sntoma de resfro o gripe. No se trate usted mismo. Este medicamento puede reducir la capacidad del cuerpo para combatir infecciones. Trate de no acercarse a personas que estn enfermas. [employer] puede aumentar el riesgo de magulladuras o sangrado. Consulte a su mdico o a su profesional de la salud si observa sangrados inusuales. Proceda con cuidado al cepillar sus dientes, usar hilo dental o Risk manager palillos para los dientes, ya que puede contraer una infeccin o Therapist, art con mayor facilidad. Si se somete a algn tratamiento dental, informe a su dentista que est [employer]. Evite tomar productos que contienen aspirina, acetaminofeno, ibuprofeno, naproxeno o quetoprofeno a menos que as lo indique su mdico. Estos productos pueden disimular la fiebre. No debe quedar embarazada mientras recibe este medicamento. Las mujeres deben informar a su mdico si estn buscando quedar embarazadas o si creen que estn embarazadas. Existe la posibilidad de que ocurran efectos secundarios graves a un beb sin nacer. Para ms informacin hable con su profesional de la salud o su farmacutico. No debe amamantar a un beb mientras est tomando este medicamento. Para los hombres, se desaconseja tener nios mientras reciben Coca-Cola. Qu efectos secundarios puedo tener al [employer] este medicamento? Efectos secundarios que debe informar a su mdico o a Barrister's clerk de la salud tan pronto como sea posible: Chief of Staff, como erupcin cutnea, picazn o urticarias, hinchazn de la cara, los labios o la lengua conteos sanguneos bajos - este frmaco puede reducir la cantidad de glbulos blancos, glbulos rojos y plaquetas. Su riesgo de infeccin y [place]. signos de infeccin - fiebre o escalofros, tos, dolor de garganta, dolor o  dificultad para orinar signos de reduccin de plaquetas o sangrado - magulladuras, puntos rojos en la piel, heces de color oscuro o con aspecto alquitranado, sangrado por la nariz signos de reduccin de glbulos rojos - cansancio o  debilidad inusual, desmayos, sensacin de mareo problemas respiratorios dolor en el pecho alta o baja presin sangunea llagas en la boca nuseas y vmito dolor, hinchazn, enrojecimiento o irritacin en el lugar de la [place], hormigueo, entumecimiento de las manos o los pies pulso cardiaco lento o irregular hinchazn de tobillos, pies, manosEfectos secundarios que, por lo general, no requieren atencin mdica (debe informarlos a su mdico o a su profesional de la salud si persisten o si son molestos): dolor de huesos cada total del cabello, incluyendo el cabello de la cabeza, de las [place], el vello pbico, las cejas y las pestaas cambios en el color de las uas de las manos diarrea aflojamiento de las uas de las manos prdida del apetito dolores musculares o articulares enrojecimiento de la piel sudoracin Puede ser que esta lista no menciona todos los posibles efectos secundarios. Comunquese a su mdico por asesoramiento mdico [employer] Usted puede informar los efectos secundarios a la FDA por telfono al 1-800-FDA-1088. Dnde debo guardar mi medicina? Este medicamento se administra en hospitales o clnicas y no necesitar guardarlo en su domicilio. ATENCIN: Este folleto es un resumen. Puede ser que no cubra toda la posible informacin. Si usted tiene preguntas acerca de esta medicina, consulte con su mdico, su farmacutico o su profesional de Technical sales engineer.    2016, Elsevier/Gold Standard. (2014-07-19 00:00:00) Ramucirumab injection What is this medicine? RAMUCIRUMAB (ra mue SIR ue mab) is a monoclonal antibody. It is used to treat stomach cancer, colorectal cancer, or lung cancer. This medicine may be used for other purposes; ask your  health care provider or pharmacist if you have questions. What should I tell my health care provider before I take this medicine? They need to know if you have any of these conditions: -bleeding disorders -blood clots -heart disease, including heart failure, heart attack, or chest pain (angina) -high blood pressure -infection (especially a virus infection such as chickenpox, cold sores, or herpes) -protein in your urine -recent surgery -stroke -an unusual or allergic reaction to ramucirumab, other medicines, foods, dyes, or preservatives -pregnant or trying to get pregnant -breast-feeding How should I use this medicine? This medicine is for infusion into a vein. It is given by a health care professional in a hospital or clinic setting. Talk to your pediatrician regarding the use of this medicine in children. Special care may be needed. Overdosage: If you think you have taken too much of this medicine contact a poison control center or emergency room at once. NOTE: This medicine is only for you. Do not share this medicine with others. What if I miss a dose? It is important not to miss your dose. Call your doctor or health care professional if you are unable to keep an appointment. What may interact with this medicine? Interactions have not been studied. This list may not describe all possible interactions. Give your health care provider a list of all the medicines, herbs, non-prescription drugs, or dietary supplements you use. Also tell them if you smoke, drink alcohol, or use illegal drugs. Some items may interact with your medicine. What should I watch for while using this medicine? Your condition will be monitored carefully while you are receiving this medicine. You will need to to check your blood pressure and have your blood and urine tested while you are taking this medicine. Your condition will be monitored carefully while you are receiving this medicine. This medicine may increase  your risk to bruise or bleed. Call your doctor or  health care professional if you notice any unusual bleeding. This medicine may rarely cause 'gastrointestinal perforation' (holes in the stomach, intestines or colon), a serious side effect requiring surgery to repair. This medicine should be started at least 28 days following major surgery and the site of the surgery should be totally healed. Check with your doctor before scheduling dental work or surgery while you are receiving this treatment. Talk to your doctor if you have recently had surgery or if you have a wound that has not healed. Do not become pregnant while taking this medicine or for 3 months after stopping it. Women should inform their doctor if they wish to become pregnant or think they might be pregnant. There is a potential for serious side effects to an unborn child. Talk to your health care professional or pharmacist for more information. What side effects may I notice from receiving this medicine? Side effects that you should report to your doctor or health care professional as soon as possible: -allergic reactions like skin rash, itching or hives, breathing problems, swelling of the face, lips, or tongue -signs of infection - fever or chills, cough, sore throat -chest pain or chest tightness -confusion -dizziness -feeling faint or lightheaded, falls -severe abdominal pain -severe nausea, vomiting -signs and symptoms of bleeding such as bloody or black, tarry stools; red or dark-brown urine; spitting up blood or brown material that looks like coffee grounds; red spots on the skin; unusual bruising or bleeding from the eye, gums, or nose -signs and symptoms of a blood clot such as breathing problems; changes in vision; chest pain; severe, sudden headache; pain, swelling, warmth in the leg; trouble speaking; sudden numbness or weakness of the face, arm or leg -symptoms of a stroke: change in mental awareness, inability to talk or  move one side of the body -trouble walking, dizziness, loss of balance or coordination Side effects that usually do not require medical attention (Report these to your doctor or health care professional if they continue or are bothersome.): -cold, clammy skin -constipation -diarrhea -headache -nausea, vomiting -stomach pain -unusually slow heartbeat -unusually weak or tired This list may not describe all possible side effects. Call your doctor for medical advice about side effects. You may report side effects to FDA at 1-800-FDA-1088. Where should I keep my medicine? This drug is given in a hospital or clinic and will not be stored at home. NOTE: This sheet is a summary. It may not cover all possible information. If you have questions about this medicine, talk to your doctor, pharmacist, or health care provider.    2016, Elsevier/Gold Standard. (2014-05-07 17:37:19) Paclitaxel injection What is this medicine? PACLITAXEL (PAK li TAX el) is a chemotherapy drug. It targets fast dividing cells, like cancer cells, and causes these cells to die. This medicine is used to treat ovarian cancer, breast cancer, and other cancers. This medicine may be used for other purposes; ask your health care provider or pharmacist if you have questions. What should I tell my health care provider before I take this medicine? They need to know if you have any of these conditions: -blood disorders -irregular heartbeat -infection (especially a virus infection such as chickenpox, cold sores, or herpes) -liver disease -previous or ongoing radiation therapy -an unusual or allergic reaction to paclitaxel, alcohol, polyoxyethylated castor oil, other chemotherapy agents, other medicines, foods, dyes, or preservatives -pregnant or trying to get pregnant -breast-feeding How should I use this medicine? This drug is given as an infusion into a vein.  It is administered in a hospital or clinic by a specially trained health  care professional. Talk to your pediatrician regarding the use of this medicine in children. Special care may be needed. Overdosage: If you think you have taken too much of this medicine contact a poison control center or emergency room at once. NOTE: This medicine is only for you. Do not share this medicine with others. What if I miss a dose? It is important not to miss your dose. Call your doctor or health care professional if you are unable to keep an appointment. What may interact with this medicine? Do not take this medicine with any of the following medications: -disulfiram -metronidazole This medicine may also interact with the following medications: -cyclosporine -diazepam -ketoconazole -medicines to increase blood counts like filgrastim, pegfilgrastim, sargramostim -other chemotherapy drugs like cisplatin, doxorubicin, epirubicin, etoposide, teniposide, vincristine -quinidine -testosterone -vaccines -verapamil Talk to your doctor or health care professional before taking any of these medicines: -acetaminophen -aspirin -ibuprofen -ketoprofen -naproxen This list may not describe all possible interactions. Give your health care provider a list of all the medicines, herbs, non-prescription drugs, or dietary supplements you use. Also tell them if you smoke, drink alcohol, or use illegal drugs. Some items may interact with your medicine. What should I watch for while using this medicine? Your condition will be monitored carefully while you are receiving this medicine. You will need important blood work done while you are taking this medicine. This drug may make you feel generally unwell. This is not uncommon, as chemotherapy can affect healthy cells as well as cancer cells. Report any side effects. Continue your course of treatment even though you feel ill unless your doctor tells you to stop. This medicine can cause serious allergic reactions. To reduce your risk you will need to  take other medicine(s) before treatment with this medicine. In some cases, you may be given additional medicines to help with side effects. Follow all directions for their use. Call your doctor or health care professional for advice if you get a fever, chills or sore throat, or other symptoms of a cold or flu. Do not treat yourself. This drug decreases your body's ability to fight infections. Try to avoid being around people who are sick. This medicine may increase your risk to bruise or bleed. Call your doctor or health care professional if you notice any unusual bleeding. Be careful brushing and flossing your teeth or using a toothpick because you may get an infection or bleed more easily. If you have any dental work done, tell your dentist you are receiving this medicine. Avoid taking products that contain aspirin, acetaminophen, ibuprofen, naproxen, or ketoprofen unless instructed by your doctor. These medicines may hide a fever. Do not become pregnant while taking this medicine. Women should inform their doctor if they wish to become pregnant or think they might be pregnant. There is a potential for serious side effects to an unborn child. Talk to your health care professional or pharmacist for more information. Do not breast-feed an infant while taking this medicine. Men are advised not to father a child while receiving this medicine. This product may contain alcohol. Ask your pharmacist or healthcare provider if this medicine contains alcohol. Be sure to tell all healthcare providers you are taking this medicine. Certain medicines, like metronidazole and disulfiram, can cause an unpleasant reaction when taken with alcohol. The reaction includes flushing, headache, nausea, vomiting, sweating, and increased thirst. The reaction can last from 30 minutes to several  hours. What side effects may I notice from receiving this medicine? Side effects that you should report to your doctor or health care  professional as soon as possible: -allergic reactions like skin rash, itching or hives, swelling of the face, lips, or tongue -low blood counts - This drug may decrease the number of white blood cells, red blood cells and platelets. You may be at increased risk for infections and bleeding. -signs of infection - fever or chills, cough, sore throat, pain or difficulty passing urine -signs of decreased platelets or bleeding - bruising, pinpoint red spots on the skin, black, tarry stools, nosebleeds -signs of decreased red blood cells - unusually weak or tired, fainting spells, lightheadedness -breathing problems -chest pain -high or low blood pressure -mouth sores -nausea and vomiting -pain, swelling, redness or irritation at the injection site -pain, tingling, numbness in the hands or feet -slow or irregular heartbeat -swelling of the ankle, feet, hands Side effects that usually do not require medical attention (report to your doctor or health care professional if they continue or are bothersome): -bone pain -complete hair loss including hair on your head, underarms, pubic hair, eyebrows, and eyelashes -changes in the color of fingernails -diarrhea -loosening of the fingernails -loss of appetite -muscle or joint pain -red flush to skin -sweating This list may not describe all possible side effects. Call your doctor for medical advice about side effects. You may report side effects to FDA at 1-800-FDA-1088. Where should I keep my medicine? This drug is given in a hospital or clinic and will not be stored at home. NOTE: This sheet is a summary. It may not cover all possible information. If you have questions about this medicine, talk to your doctor, pharmacist, or health care provider.    2016, Elsevier/Gold Standard. (2014-10-24 13:02:56)

## 2015-01-29 ENCOUNTER — Telehealth: Payer: Self-pay | Admitting: Nurse Practitioner

## 2015-01-29 ENCOUNTER — Telehealth: Payer: Self-pay | Admitting: *Deleted

## 2015-01-29 NOTE — Telephone Encounter (Signed)
Appointments added and calendars mailed to patient

## 2015-01-29 NOTE — Telephone Encounter (Signed)
Per staff message and POF I have scheduled appts. Advised scheduler of appts. JMW  

## 2015-02-02 ENCOUNTER — Other Ambulatory Visit: Payer: Self-pay | Admitting: Oncology

## 2015-02-03 ENCOUNTER — Other Ambulatory Visit: Payer: Self-pay | Admitting: *Deleted

## 2015-02-03 MED ORDER — DEXAMETHASONE 2 MG PO TABS: ORAL_TABLET | ORAL | Status: DC

## 2015-02-03 NOTE — Telephone Encounter (Signed)
Called pt's wife with instructions to take Decadron pre-med for Taxol on Fri. Rx sent electronically to [facility] per wife's request.

## 2015-02-07 ENCOUNTER — Other Ambulatory Visit (HOSPITAL_BASED_OUTPATIENT_CLINIC_OR_DEPARTMENT_OTHER): Payer: Medicare Other

## 2015-02-07 ENCOUNTER — Ambulatory Visit (HOSPITAL_BASED_OUTPATIENT_CLINIC_OR_DEPARTMENT_OTHER): Payer: Medicare Other

## 2015-02-07 VITALS — BP 115/71 | HR 76 | Temp 98.3°F | Resp 18

## 2015-02-07 DIAGNOSIS — Z5112 Encounter for antineoplastic immunotherapy: Secondary | ICD-10-CM

## 2015-02-07 DIAGNOSIS — C169 Malignant neoplasm of stomach, unspecified: Secondary | ICD-10-CM

## 2015-02-07 DIAGNOSIS — Z5111 Encounter for antineoplastic chemotherapy: Secondary | ICD-10-CM

## 2015-02-07 LAB — COMPREHENSIVE METABOLIC PANEL (CC13)
ALBUMIN: 3.3 g/dL — AB (ref 3.5–5.0)
ALK PHOS: 75 U/L (ref 40–150)
ALT: <9 U/L (ref 0–55)
AST: 11 U/L (ref 5–34)
Anion Gap: 10 mEq/L (ref 3–11)
BUN: 11.1 mg/dL (ref 7.0–26.0)
CALCIUM: 9.6 mg/dL (ref 8.4–10.4)
CHLORIDE: 104 meq/L (ref 98–109)
CO2: 24 mEq/L (ref 22–29)
Creatinine: 1 mg/dL (ref 0.7–1.3)
EGFR: 87 mL/min/{1.73_m2} — AB (ref >90)
Glucose: 218 mg/dl — ABNORMAL HIGH (ref 70–140)
POTASSIUM: 4.2 meq/L (ref 3.5–5.1)
SODIUM: 138 meq/L (ref 136–145)
Total Bilirubin: <0.3 mg/dL (ref 0.20–1.20)
Total Protein: 7.2 g/dL (ref 6.4–8.3)

## 2015-02-07 LAB — CBC WITH DIFFERENTIAL/PLATELET
BASO%: 0.1 % (ref 0.0–2.0)
BASOS ABS: 0 10*3/uL (ref 0.0–0.1)
EOS%: 0 % (ref 0.0–7.0)
Eosinophils Absolute: 0 10*3/uL (ref 0.0–0.5)
HCT: 32.1 % — ABNORMAL LOW (ref 38.4–49.9)
HGB: 9.8 g/dL — ABNORMAL LOW (ref 13.0–17.1)
LYMPH#: 0.4 10*3/uL — AB (ref 0.9–3.3)
LYMPH%: 5.5 % — ABNORMAL LOW (ref 14.0–49.0)
MCH: 21 pg — AB (ref 27.2–33.4)
MCHC: 30.5 g/dL — AB (ref 32.0–36.0)
MCV: 68.7 fL — AB (ref 79.3–98.0)
MONO#: 0 10*3/uL — AB (ref 0.1–0.9)
MONO%: 0.4 % (ref 0.0–14.0)
NEUT#: 6 10*3/uL (ref 1.5–6.5)
NEUT%: 94 % — AB (ref 39.0–75.0)
Platelets: 387 10*3/uL (ref 140–400)
RBC: 4.68 10*6/uL (ref 4.20–5.82)
RDW: 21.3 % — ABNORMAL HIGH (ref 11.0–14.6)
WBC: 6.4 10*3/uL (ref 4.0–10.3)

## 2015-02-07 LAB — UA PROTEIN, DIPSTICK - CHCC: Protein, ur: <30 mg/dL

## 2015-02-07 MED ORDER — DIPHENHYDRAMINE HCL 50 MG/ML IJ SOLN
25.0000 mg | Freq: Once | INTRAMUSCULAR | Status: AC
  Administered 2015-02-07: 25 mg via INTRAVENOUS

## 2015-02-07 MED ORDER — SODIUM CHLORIDE 0.9 % IJ SOLN
10.0000 mL | INTRAMUSCULAR | Status: DC | PRN
  Administered 2015-02-07: 10 mL
  Filled 2015-02-07: qty 10

## 2015-02-07 MED ORDER — DIPHENHYDRAMINE HCL 50 MG/ML IJ SOLN
INTRAMUSCULAR | Status: AC
  Filled 2015-02-07: qty 1

## 2015-02-07 MED ORDER — DEXTROSE 5 % IV SOLN
80.0000 mg/m2 | Freq: Once | INTRAVENOUS | Status: AC
  Administered 2015-02-07: 138 mg via INTRAVENOUS
  Filled 2015-02-07: qty 23

## 2015-02-07 MED ORDER — SODIUM CHLORIDE 0.9 % IV SOLN
Freq: Once | INTRAVENOUS | Status: AC
  Administered 2015-02-07: 11:00:00 via INTRAVENOUS
  Filled 2015-02-07: qty 4

## 2015-02-07 MED ORDER — ACETAMINOPHEN 325 MG PO TABS
ORAL_TABLET | ORAL | Status: AC
  Filled 2015-02-07: qty 2

## 2015-02-07 MED ORDER — SODIUM CHLORIDE 0.9 % IV SOLN
8.0000 mg/kg | Freq: Once | INTRAVENOUS | Status: AC
  Administered 2015-02-07: 500 mg via INTRAVENOUS
  Filled 2015-02-07: qty 50

## 2015-02-07 MED ORDER — FAMOTIDINE IN NACL 20-0.9 MG/50ML-% IV SOLN
20.0000 mg | Freq: Once | INTRAVENOUS | Status: AC
  Administered 2015-02-07: 20 mg via INTRAVENOUS

## 2015-02-07 MED ORDER — FAMOTIDINE IN NACL 20-0.9 MG/50ML-% IV SOLN
INTRAVENOUS | Status: AC
  Filled 2015-02-07: qty 50

## 2015-02-07 MED ORDER — ACETAMINOPHEN 325 MG PO TABS
650.0000 mg | ORAL_TABLET | Freq: Once | ORAL | Status: AC
  Administered 2015-02-07: 650 mg via ORAL

## 2015-02-07 MED ORDER — HEPARIN SOD (PORK) LOCK FLUSH 100 UNIT/ML IV SOLN
500.0000 [IU] | Freq: Once | INTRAVENOUS | Status: AC | PRN
  Administered 2015-02-07: 500 [IU]
  Filled 2015-02-07: qty 5

## 2015-02-07 MED ORDER — SODIUM CHLORIDE 0.9 % IV SOLN
Freq: Once | INTRAVENOUS | Status: AC
  Administered 2015-02-07: 09:00:00 via INTRAVENOUS

## 2015-02-07 NOTE — Patient Instructions (Signed)
[facility] Discharge Instructions for Patients Receiving Chemotherapy  Today you received the following chemotherapy agents taxol and cyramza.  To help prevent nausea and vomiting after your treatment, we encourage you to take your nausea medication.   If you develop nausea and vomiting that is not controlled by your nausea medication, call the clinic.   BELOW ARE SYMPTOMS THAT SHOULD BE REPORTED IMMEDIATELY:  *FEVER GREATER THAN 100.5 F  *CHILLS WITH OR WITHOUT FEVER  NAUSEA AND VOMITING THAT IS NOT CONTROLLED WITH YOUR NAUSEA MEDICATION  *UNUSUAL SHORTNESS OF BREATH  *UNUSUAL BRUISING OR BLEEDING  TENDERNESS IN MOUTH AND THROAT WITH OR WITHOUT PRESENCE OF ULCERS  *URINARY PROBLEMS  *BOWEL PROBLEMS  UNUSUAL RASH Items with * indicate a potential emergency and should be followed up as soon as possible.  Feel free to call the clinic you have any questions or concerns. The clinic phone number is [phone].  Please show the [place] at check-in to the Emergency Department and triage nurse.  Paclitaxel injection Qu es este medicamento? El PACLITAXEL es un agente quimioteraputico. Este medicamento acta sobre las clulas que se dividen rpidamente, como las clulas cancerosas, y finalmente provoca la muerte de estas clulas. Se utiliza en el tratamiento del cncer de [place], mama y otros tipos de cncer. Este medicamento puede ser utilizado para otros usos; si tiene alguna pregunta consulte con su proveedor de atencin mdica o con su farmacutico. Qu le debo informar a mi profesional de la salud antes de tomar este medicamento? Necesita saber si usted presenta alguno de los siguientes problemas o situaciones: -trastornos sanguneos -pulso cardiaco irregular -infeccin (especialmente infecciones virales, como varicela o herpes) -enfermedad heptica -radioterapia reciente o continua -una reaccin alrgica o inusual al paclitaxel, alcohol, aceite  de ricino polioxietilado, otros agentes quimioteraputicos, medicamentos, alimentos, colorantes o conservantes -si est embarazada o buscando quedar embarazada -si est amamantando a un beb Cmo debo utilizar este medicamento? Este medicamento se administra mediante infusin por va intravenosa. Lo administra un profesional de la salud calificado en un hospital o en un entorno clnico. Hable con su pediatra para informarse acerca del uso de este medicamento en nios. Puede requerir atencin especial. Sobredosis: Pngase en contacto inmediatamente con un centro toxicolgico o una sala de urgencia si usted cree que haya tomado demasiado medicamento. ATENCIN: [employer] es solo para usted. No comparta este medicamento con nadie. Qu sucede si me olvido de una dosis? Es importante no olvidar ninguna dosis. Informe a su mdico o a su profesional de la salud si no puede asistir a Photographer. Qu puede interactuar con este medicamento? No tome esta medicina con ninguno de los siguientes medicamentos: -disulfiram -metronidazol Esta medicina tambin puede interactuar con los siguientes medicamentos: -ciclosporina -diazepam -quetoconazol -medicamentos para incrementar los conteos sanguneos, tales como filgrastim, pegfilgrastim, sargramostim -otros agentes quimioteraputicos, tales como cisplatino, doxorrubicina, epirubicina, etopsido, teniposida, vincristina -quinidina -testosterona -vacunas -verapamilo Consulte a su mdico o a su profesional de la salud antes de tomar cualquiera de los siguientes medicamentos: -acetaminofeno -aspirina -ibuprofeno -quetoprofeno -naproxeno Puede ser que esta lista no menciona todas las posibles interacciones. Informe a su profesional de [employer]	[place] de [employer] productos a base de hierbas, medicamentos de [place] o suplementos nutritivos que est tomando. Si usted fuma, consume bebidas alcohlicas o si utiliza drogas ilegales, indqueselo tambin a su  profesional de [employer]	[place]. Algunas sustancias pueden interactuar con su medicamento. A qu debo estar atento al usar [employer]? Se supervisar su estado de  salud atentamente mientras reciba este medicamento. Tendr que hacerse anlisis de sangre importantes mientras est tomando este medicamento. Este medicamento puede hacerle sentir un Nurse, mental health. Esto es normal ya que la quimioterapia afecta tanto a las clulas sanas como a las clulas cancerosas. Si presenta alguno de los [employer], infrmelos. Sin embargo, contine con el tratamiento aun si se siente enfermo, a menos que su mdico le indique que lo suspenda. Este medicamento puede causar Chief of Staff graves. Para reducir su riesgo necesitar tomar otro(s) medicamento(s) antes del tratamiento con este medicamento. En algunos casos, podr recibir [employer] para ayudarlo con los efectos secundarios. Siga las instrucciones para usarlos. Consulte a su mdico o a su profesional de la salud si tiene fiebre, escalofros, dolor de garganta o cualquier otro sntoma de resfro o gripe. No se trate usted mismo. Este medicamento puede reducir la capacidad del cuerpo para combatir infecciones. Trate de no acercarse a personas que estn enfermas. [employer] puede aumentar el riesgo de magulladuras o sangrado. Consulte a su mdico o a su profesional de la salud si observa sangrados inusuales. Proceda con cuidado al cepillar sus dientes, usar hilo dental o Risk manager palillos para los dientes, ya que puede contraer una infeccin o Therapist, art con mayor facilidad. Si se somete a algn tratamiento dental, informe a su dentista que est [employer]. Evite tomar productos que contienen aspirina, acetaminofeno, ibuprofeno, naproxeno o quetoprofeno a menos que as lo indique su mdico. Estos productos pueden disimular la fiebre. No debe quedar embarazada mientras recibe este medicamento. Las mujeres deben informar a su  mdico si estn buscando quedar embarazadas o si creen que estn embarazadas. Existe la posibilidad de que ocurran efectos secundarios graves a un beb sin nacer. Para ms informacin hable con su profesional de la salud o su farmacutico. No debe amamantar a un beb mientras est tomando este medicamento. Para los hombres, se desaconseja tener nios mientras reciben [employer]. Qu efectos secundarios puedo tener al [employer] este medicamento? Efectos secundarios que debe informar a su mdico o a Barrister's clerk de la salud tan pronto como sea posible: Chief of Staff, como erupcin cutnea, picazn o urticarias, hinchazn de la cara, los labios o la lengua conteos sanguneos bajos - este frmaco puede reducir la cantidad de glbulos blancos, glbulos rojos y plaquetas. Su riesgo de infeccin y [place]. signos de infeccin - fiebre o escalofros, tos, dolor de garganta, dolor o dificultad para orinar signos de reduccin de plaquetas o sangrado - magulladuras, puntos rojos en la piel, heces de color oscuro o con aspecto alquitranado, sangrado por la nariz signos de reduccin de glbulos rojos - cansancio o debilidad inusual, desmayos, sensacin de mareo problemas respiratorios dolor en el pecho alta o baja presin sangunea llagas en la boca nuseas y vmito dolor, hinchazn, enrojecimiento o irritacin en el lugar de la [place], hormigueo, entumecimiento de las manos o los pies pulso cardiaco lento o irregular hinchazn de tobillos, pies, manosEfectos secundarios que, por lo general, no requieren atencin mdica (debe informarlos a su mdico o a su profesional de la salud si persisten o si son molestos): dolor de huesos cada total del cabello, incluyendo el cabello de la cabeza, de las [place], el vello pbico, las cejas y las pestaas cambios en el color de las uas de las manos diarrea aflojamiento de las uas de las manos prdida del apetito dolores musculares o articulares  enrojecimiento de la piel sudoracin Puede ser que esta lista no menciona todos los  posibles efectos secundarios. Comunquese a su mdico por asesoramiento mdico [employer]. Usted puede informar los efectos secundarios a la FDA por telfono al 1-800-FDA-1088. Dnde debo guardar mi medicina? Este medicamento se administra en hospitales o clnicas y no necesitar guardarlo en su domicilio. ATENCIN: Este folleto es un resumen. Puede ser que no cubra toda la posible informacin. Si usted tiene preguntas acerca de esta medicina, consulte con su mdico, su farmacutico o su profesional de Technical sales engineer.    2016, Elsevier/Gold Standard. (2014-07-19 00:00:00)  Ramucirumab injection Qu es este medicamento? RAMUCIRUMAB es un agente quimioteraputico. Se utiliza para tratar cncer de estmago, cncer de colon o de recto, o cncer de pulmn. Este [employer] un receptor de la protena especfica que se Firefighter en las clulas cancerosas y [place] clulas cancerosas. Este medicamento puede ser utilizado para otros usos; si tiene alguna pregunta consulte con su proveedor de atencin mdica o con su farmacutico. Qu le debo informar a mi profesional de la salud antes de tomar este medicamento? Necesita saber si usted presenta alguno de los siguientes problemas o situaciones: trastornos sanguneos cogulos sanguneos enfermedad cardiaca, incluyendo insuficiencia cardiaca, ataque cardiaco o dolor en el pecho (angina) alta presin sangunea infeccin (especialmente infecciones virales, como varicela o herpes) protena en la orina ciruga reciente derrame cerebral una reaccin alrgica o inusual al ramucirumab, a otros medicamentos, alimentos, colorantes o conservantes si est embarazada o buscando quedar embarazada si est amamantando a un beb Cmo debo utilizar este medicamento? Este medicamento se administra mediante infusin por va intravenosa. Lo  administra un profesional de Technical sales engineer en un hospital o en un entorno clnico. Hable con su pediatra para informarse acerca del uso de este medicamento en nios. Puede requerir atencin especial. Sobredosis: Pngase en contacto inmediatamente con un centro toxicolgico o una sala de urgencia si usted cree que haya tomado demasiado medicamento. ATENCIN: [employer] es solo para usted. No comparta este medicamento con nadie. Qu sucede si me olvido de una dosis? Es importante no olvidar ninguna dosis. Informe a su mdico o a su profesional de la salud si no puede asistir a Photographer. Qu puede interactuar con este medicamento? No se han estudiado las interacciones. Puede ser que esta lista no menciona todas las posibles interacciones. Informe a su profesional de [employer]	[place] de [employer] productos a base de hierbas, medicamentos de [place] o suplementos nutritivos que est tomando. Si usted fuma, consume bebidas alcohlicas o si utiliza drogas ilegales, indqueselo tambin a su profesional de [employer]	[place]. Algunas sustancias pueden interactuar con su medicamento. A qu debo estar atento al usar Coca-Cola? Se supervisar su condicin atentamente mientras reciba este medicamento. Tendr que revisar su presin sangunea y [employer] de [place] y [place] mientras est tomando este medicamento. Se supervisar su estado de salud atentamente mientras reciba este medicamento. [employer] puede aumentar el riesgo de magulladuras o sangrado. Consulte a su mdico o a su profesional de la salud si observa sangrados inusuales. Este medicamento puede causar raramente 'perforacin gastrointestinal' (agujeros en el estmago, intestinos o colon), un efecto secundario severo que requieren ciruga para Psychiatric nurse. Este Biomedical engineer por lo menos 28 das despus de la [place] mayor y TEFL teacher de la ciruga debe ser totalmente curado. Consulte a su mdico antes de programar una ciruga o  tratamiento dental mientras que est recibiendo [employer]. Hable con su mdico si usted ha tenido [place] recientemente o si tiene una herida  que no ha curado. No se debe quedar embarazada mientras est tomando este medicamento o durante 3 meses despus de dejar de tomarlo. Las mujeres deben informar a su mdico si estn buscando quedar embarazadas o si creen que estn embarazadas. Existe la posibilidad de efectos secundarios graves a un beb sin nacer. Para ms informacin hable con su profesional de la salud o su farmacutico. Qu efectos secundarios puedo tener al utilizar este medicamento? Efectos secundarios que debe informar a su mdico o a Barrister's clerk de la salud tan pronto como sea posible: Chief of Staff como erupcin cutnea, picazn o urticarias, problemas respiratorios, hinchazn de la cara, labios o lengua signos de infeccin - fiebre o escalofros, tos, dolor de Press photographer en el pecho u opresin en el pecho confusin mareos sensacin de desmayos o aturdimiento, cadas dolor abdominal severo nuseas, vmito severo signos y sntomas de sangrado, como heces de color oscuro o de aspecto alquitranado, heces con [place]; orina de color rojo o marrn oscuro, escupir sangre o material marrn que parece como granos de caf, manchas rojas en la piel, magulladuras o sangrado inusual del ojo, encas o nariz signos o sntomas de un cogulo sanguneo, como problemas respiratorio; cambios en la visin; dolor en el pecho; dolor de cabeza severo o repentino; dolor, hinchazn, calor en la pierna, dificultad para hablar; entumecimiento repentina de la cara, brazo o pierna sntomas de un derrame cerebral; cambios en la conciencia mental, incapacidad para hablar o mover un lado del cuerpo dificultad para caminar, mareos, prdida del equilibrio o coordinacin Efectos secundarios que, por lo general, no requieren atencin mdica (debe informarlos a su mdico o a su profesional de la salud si  persisten o si son molestos): piel sudorosa, fra estreimiento diarrea dolor de cabeza nuseas, vmito dolor estomacal pulso cardiaco inusualmente lento cansancio o debilidad inusual Puede ser que esta lista no menciona todos los posibles efectos secundarios. Comunquese a su mdico por asesoramiento mdico [employer]. Usted puede informar los efectos secundarios a la FDA por telfono al 1-800-FDA-1088. Dnde debo guardar mi medicina? Este medicamento se administra en hospitales o clnicas y no necesitar guardarlo en su domicilio. ATENCIN: Este folleto es un resumen. Puede ser que no cubra toda la posible informacin. Si usted tiene preguntas acerca de esta medicina, consulte con su mdico, su farmacutico o su profesional de Technical sales engineer.    2016, Elsevier/Gold Standard. (2014-05-01 00:00:00)

## 2015-02-08 ENCOUNTER — Other Ambulatory Visit: Payer: Self-pay | Admitting: Internal Medicine

## 2015-02-08 LAB — CEA: CEA: 42.5 ng/mL — AB (ref 0.0–5.0)

## 2015-02-08 MED ORDER — CHLORPROMAZINE HCL 25 MG PO TABS: 25.0000 mg | ORAL_TABLET | Freq: Three times a day (TID) | ORAL | Status: DC

## 2015-02-09 ENCOUNTER — Other Ambulatory Visit: Payer: Self-pay | Admitting: Oncology

## 2015-02-10 ENCOUNTER — Telehealth: Payer: Self-pay | Admitting: *Deleted

## 2015-02-10 ENCOUNTER — Ambulatory Visit: Payer: Self-pay

## 2015-02-10 NOTE — Telephone Encounter (Signed)
Return call from patient's wife.  "He doesn't need to come in today.  I called him, he's eaten breakfast and kept it down."  This nurse clarified due to extent of description vomiting since chemotherapy.  02-07-2015 first Taxol, cyramza and he needs to rehydrate.  Last time vomited was this morning x 1 per [name].  "Does he have to keep the appointment?  He said he doesn't need to come in for IVF."  Asked that she ensure he is drinking to hydrate and to call if any changes.  Will notify provider.

## 2015-02-10 NOTE — Telephone Encounter (Signed)
Called pt for chemo follow up, spoke with wife. She reports he has been throwing up since chemo. Still having abdominal pain, crying this AM because he felt so bad. Offered office visit/ IV fluids. She is at work now, will check on pt and call back to office.

## 2015-02-14 ENCOUNTER — Other Ambulatory Visit (HOSPITAL_BASED_OUTPATIENT_CLINIC_OR_DEPARTMENT_OTHER): Payer: Medicare Other

## 2015-02-14 ENCOUNTER — Ambulatory Visit (HOSPITAL_BASED_OUTPATIENT_CLINIC_OR_DEPARTMENT_OTHER): Payer: Medicare Other

## 2015-02-14 ENCOUNTER — Other Ambulatory Visit: Payer: Self-pay

## 2015-02-14 VITALS — BP 117/70 | HR 71 | Temp 98.5°F | Resp 18

## 2015-02-14 DIAGNOSIS — Z5111 Encounter for antineoplastic chemotherapy: Secondary | ICD-10-CM

## 2015-02-14 DIAGNOSIS — C786 Secondary malignant neoplasm of retroperitoneum and peritoneum: Secondary | ICD-10-CM

## 2015-02-14 DIAGNOSIS — C169 Malignant neoplasm of stomach, unspecified: Secondary | ICD-10-CM

## 2015-02-14 LAB — CBC WITH DIFFERENTIAL/PLATELET
BASO%: 1.6 % (ref 0.0–2.0)
BASOS ABS: 0.1 10*3/uL (ref 0.0–0.1)
EOS%: 2.2 % (ref 0.0–7.0)
Eosinophils Absolute: 0.1 10*3/uL (ref 0.0–0.5)
HEMATOCRIT: 29.7 % — AB (ref 38.4–49.9)
HGB: 8.9 g/dL — ABNORMAL LOW (ref 13.0–17.1)
LYMPH#: 1.5 10*3/uL (ref 0.9–3.3)
LYMPH%: 32.7 % (ref 14.0–49.0)
MCH: 20.8 pg — AB (ref 27.2–33.4)
MCHC: 29.9 g/dL — AB (ref 32.0–36.0)
MCV: 69.5 fL — ABNORMAL LOW (ref 79.3–98.0)
MONO#: 0.3 10*3/uL (ref 0.1–0.9)
MONO%: 6 % (ref 0.0–14.0)
NEUT#: 2.6 10*3/uL (ref 1.5–6.5)
NEUT%: 57.5 % (ref 39.0–75.0)
PLATELETS: 297 10*3/uL (ref 140–400)
RBC: 4.27 10*6/uL (ref 4.20–5.82)
RDW: 21 % — ABNORMAL HIGH (ref 11.0–14.6)
WBC: 4.5 10*3/uL (ref 4.0–10.3)

## 2015-02-14 LAB — COMPREHENSIVE METABOLIC PANEL (CC13)
ALT: 9 U/L (ref 0–55)
ANION GAP: 6 meq/L (ref 3–11)
AST: 12 U/L (ref 5–34)
Albumin: 3.2 g/dL — ABNORMAL LOW (ref 3.5–5.0)
Alkaline Phosphatase: 62 U/L (ref 40–150)
BUN: 10.9 mg/dL (ref 7.0–26.0)
CALCIUM: 8.7 mg/dL (ref 8.4–10.4)
CHLORIDE: 108 meq/L (ref 98–109)
CO2: 24 meq/L (ref 22–29)
Creatinine: 0.7 mg/dL (ref 0.7–1.3)
EGFR: >90 mL/min/{1.73_m2} (ref >90)
Glucose: 104 mg/dl (ref 70–140)
POTASSIUM: 4 meq/L (ref 3.5–5.1)
Sodium: 138 mEq/L (ref 136–145)
Total Bilirubin: <0.3 mg/dL (ref 0.20–1.20)
Total Protein: 6.2 g/dL — ABNORMAL LOW (ref 6.4–8.3)

## 2015-02-14 MED ORDER — FAMOTIDINE IN NACL 20-0.9 MG/50ML-% IV SOLN
20.0000 mg | Freq: Once | INTRAVENOUS | Status: AC
  Administered 2015-02-14: 20 mg via INTRAVENOUS

## 2015-02-14 MED ORDER — HEPARIN SOD (PORK) LOCK FLUSH 100 UNIT/ML IV SOLN
500.0000 [IU] | Freq: Once | INTRAVENOUS | Status: AC | PRN
  Administered 2015-02-14: 500 [IU]
  Filled 2015-02-14: qty 5

## 2015-02-14 MED ORDER — PACLITAXEL CHEMO INJECTION 300 MG/50ML
80.0000 mg/m2 | Freq: Once | INTRAVENOUS | Status: AC
  Administered 2015-02-14: 138 mg via INTRAVENOUS
  Filled 2015-02-14: qty 23

## 2015-02-14 MED ORDER — SODIUM CHLORIDE 0.9 % IJ SOLN
10.0000 mL | INTRAMUSCULAR | Status: DC | PRN
  Administered 2015-02-14: 10 mL
  Filled 2015-02-14: qty 10

## 2015-02-14 MED ORDER — SODIUM CHLORIDE 0.9 % IV SOLN
Freq: Once | INTRAVENOUS | Status: AC
  Administered 2015-02-14: 13:00:00 via INTRAVENOUS
  Filled 2015-02-14: qty 4

## 2015-02-14 MED ORDER — DIPHENHYDRAMINE HCL 50 MG/ML IJ SOLN
25.0000 mg | Freq: Once | INTRAMUSCULAR | Status: AC
Start: 2015-02-14 — End: 2015-02-14
  Administered 2015-02-14: 25 mg via INTRAVENOUS

## 2015-02-14 MED ORDER — SODIUM CHLORIDE 0.9 % IV SOLN
Freq: Once | INTRAVENOUS | Status: AC
  Administered 2015-02-14: 12:00:00 via INTRAVENOUS

## 2015-02-14 MED ORDER — DIPHENHYDRAMINE HCL 50 MG/ML IJ SOLN
INTRAMUSCULAR | Status: AC
  Filled 2015-02-14: qty 1

## 2015-02-14 MED ORDER — FAMOTIDINE IN NACL 20-0.9 MG/50ML-% IV SOLN
INTRAVENOUS | Status: AC
  Filled 2015-02-14: qty 50

## 2015-02-14 NOTE — Patient Instructions (Signed)
[facility] Discharge Instructions for Patients Receiving Chemotherapy  Today you received the following chemotherapy agents: Taxol  To help prevent nausea and vomiting after your treatment, we encourage you to take your nausea medication as prescribed by your physician.   If you develop nausea and vomiting that is not controlled by your nausea medication, call the clinic.   BELOW ARE SYMPTOMS THAT SHOULD BE REPORTED IMMEDIATELY:  *FEVER GREATER THAN 100.5 F  *CHILLS WITH OR WITHOUT FEVER  NAUSEA AND VOMITING THAT IS NOT CONTROLLED WITH YOUR NAUSEA MEDICATION  *UNUSUAL SHORTNESS OF BREATH  *UNUSUAL BRUISING OR BLEEDING  TENDERNESS IN MOUTH AND THROAT WITH OR WITHOUT PRESENCE OF ULCERS  *URINARY PROBLEMS  *BOWEL PROBLEMS  UNUSUAL RASH Items with * indicate a potential emergency and should be followed up as soon as possible.  Feel free to call the clinic you have any questions or concerns. The clinic phone number is [phone].  Please show the CHEMO ALERT CARD at check-in to the Emergency Department and triage nurse.   

## 2015-02-16 ENCOUNTER — Other Ambulatory Visit: Payer: Self-pay | Admitting: Oncology

## 2015-02-17 ENCOUNTER — Telehealth: Payer: Self-pay | Admitting: *Deleted

## 2015-02-17 NOTE — Telephone Encounter (Signed)
Per Dr. Benay Spice; left voice message for pt to stop taking Thorzine because it interacts with Zofran; please call office to confirm message received.

## 2015-02-20 NOTE — Telephone Encounter (Signed)
Left another voice message for pt to call office confirming he understands to stop taking Thorazine d/t interaction with Zofran.

## 2015-02-20 NOTE — Telephone Encounter (Signed)
Received voice message back from pt wife stating that "he has stopped taking the Thorazine; we got message"

## 2015-02-21 ENCOUNTER — Ambulatory Visit (HOSPITAL_BASED_OUTPATIENT_CLINIC_OR_DEPARTMENT_OTHER): Payer: Medicare Other

## 2015-02-21 ENCOUNTER — Ambulatory Visit (HOSPITAL_BASED_OUTPATIENT_CLINIC_OR_DEPARTMENT_OTHER): Payer: Medicare Other | Admitting: Nurse Practitioner

## 2015-02-21 ENCOUNTER — Telehealth: Payer: Self-pay | Admitting: Nurse Practitioner

## 2015-02-21 ENCOUNTER — Other Ambulatory Visit (HOSPITAL_BASED_OUTPATIENT_CLINIC_OR_DEPARTMENT_OTHER): Payer: Medicare Other

## 2015-02-21 ENCOUNTER — Encounter: Payer: Self-pay | Admitting: Nurse Practitioner

## 2015-02-21 VITALS — BP 124/80 | HR 71 | Temp 98.4°F | Resp 18 | Ht 64.5 in | Wt 144.7 lb

## 2015-02-21 DIAGNOSIS — C162 Malignant neoplasm of body of stomach: Secondary | ICD-10-CM

## 2015-02-21 DIAGNOSIS — Z5112 Encounter for antineoplastic immunotherapy: Secondary | ICD-10-CM | POA: Diagnosis not present

## 2015-02-21 DIAGNOSIS — C786 Secondary malignant neoplasm of retroperitoneum and peritoneum: Secondary | ICD-10-CM | POA: Diagnosis not present

## 2015-02-21 DIAGNOSIS — Z5111 Encounter for antineoplastic chemotherapy: Secondary | ICD-10-CM

## 2015-02-21 DIAGNOSIS — C169 Malignant neoplasm of stomach, unspecified: Secondary | ICD-10-CM

## 2015-02-21 DIAGNOSIS — R112 Nausea with vomiting, unspecified: Secondary | ICD-10-CM | POA: Diagnosis not present

## 2015-02-21 DIAGNOSIS — C787 Secondary malignant neoplasm of liver and intrahepatic bile duct: Secondary | ICD-10-CM | POA: Diagnosis not present

## 2015-02-21 DIAGNOSIS — D509 Iron deficiency anemia, unspecified: Secondary | ICD-10-CM | POA: Diagnosis not present

## 2015-02-21 DIAGNOSIS — R1115 Cyclical vomiting syndrome unrelated to migraine: Secondary | ICD-10-CM

## 2015-02-21 LAB — CBC WITH DIFFERENTIAL/PLATELET
BASO%: 1.8 % (ref 0.0–2.0)
BASOS ABS: 0.1 10*3/uL (ref 0.0–0.1)
EOS%: 1.9 % (ref 0.0–7.0)
Eosinophils Absolute: 0.1 10*3/uL (ref 0.0–0.5)
HCT: 28.8 % — ABNORMAL LOW (ref 38.4–49.9)
HEMOGLOBIN: 8.7 g/dL — AB (ref 13.0–17.1)
LYMPH%: 42 % (ref 14.0–49.0)
MCH: 21.2 pg — AB (ref 27.2–33.4)
MCHC: 30.3 g/dL — AB (ref 32.0–36.0)
MCV: 70.1 fL — ABNORMAL LOW (ref 79.3–98.0)
MONO#: 0.3 10*3/uL (ref 0.1–0.9)
MONO%: 10.2 % (ref 0.0–14.0)
NEUT#: 1.4 10*3/uL — ABNORMAL LOW (ref 1.5–6.5)
NEUT%: 44.1 % (ref 39.0–75.0)
Platelets: 281 10*3/uL (ref 140–400)
RBC: 4.12 10*6/uL — ABNORMAL LOW (ref 4.20–5.82)
RDW: 21.7 % — ABNORMAL HIGH (ref 11.0–14.6)
WBC: 3.1 10*3/uL — ABNORMAL LOW (ref 4.0–10.3)
lymph#: 1.3 10*3/uL (ref 0.9–3.3)

## 2015-02-21 LAB — COMPREHENSIVE METABOLIC PANEL
AST: 11 U/L (ref 5–34)
Albumin: 3.3 g/dL — ABNORMAL LOW (ref 3.5–5.0)
Alkaline Phosphatase: 67 U/L (ref 40–150)
Anion Gap: 7 mEq/L (ref 3–11)
BUN: 10.6 mg/dL (ref 7.0–26.0)
CHLORIDE: 110 meq/L — AB (ref 98–109)
CO2: 23 mEq/L (ref 22–29)
Calcium: 8.8 mg/dL (ref 8.4–10.4)
Creatinine: 0.8 mg/dL (ref 0.7–1.3)
EGFR: >90 mL/min/{1.73_m2} (ref >90)
GLUCOSE: 95 mg/dL (ref 70–140)
POTASSIUM: 4.3 meq/L (ref 3.5–5.1)
SODIUM: 140 meq/L (ref 136–145)
Total Bilirubin: <0.3 mg/dL (ref 0.20–1.20)
Total Protein: 6.4 g/dL (ref 6.4–8.3)

## 2015-02-21 MED ORDER — DIPHENHYDRAMINE HCL 50 MG/ML IJ SOLN
INTRAMUSCULAR | Status: AC
  Filled 2015-02-21: qty 1

## 2015-02-21 MED ORDER — SODIUM CHLORIDE 0.9 % IV SOLN
Freq: Once | INTRAVENOUS | Status: AC
  Administered 2015-02-21: 11:00:00 via INTRAVENOUS

## 2015-02-21 MED ORDER — ACETAMINOPHEN 325 MG PO TABS
ORAL_TABLET | ORAL | Status: AC
  Filled 2015-02-21: qty 2

## 2015-02-21 MED ORDER — HEPARIN SOD (PORK) LOCK FLUSH 100 UNIT/ML IV SOLN
500.0000 [IU] | Freq: Once | INTRAVENOUS | Status: AC | PRN
  Administered 2015-02-21: 500 [IU]
  Filled 2015-02-21: qty 5

## 2015-02-21 MED ORDER — FAMOTIDINE IN NACL 20-0.9 MG/50ML-% IV SOLN
INTRAVENOUS | Status: AC
  Filled 2015-02-21: qty 50

## 2015-02-21 MED ORDER — DIPHENHYDRAMINE HCL 50 MG/ML IJ SOLN
25.0000 mg | Freq: Once | INTRAMUSCULAR | Status: AC
Start: 2015-02-21 — End: 2015-02-21
  Administered 2015-02-21: 25 mg via INTRAVENOUS

## 2015-02-21 MED ORDER — ACETAMINOPHEN 325 MG PO TABS
650.0000 mg | ORAL_TABLET | Freq: Once | ORAL | Status: AC
Start: 2015-02-21 — End: 2015-02-21
  Administered 2015-02-21: 650 mg via ORAL

## 2015-02-21 MED ORDER — SODIUM CHLORIDE 0.9 % IV SOLN
8.0000 mg/kg | Freq: Once | INTRAVENOUS | Status: AC
  Administered 2015-02-21: 500 mg via INTRAVENOUS
  Filled 2015-02-21: qty 50

## 2015-02-21 MED ORDER — SODIUM CHLORIDE 0.9 % IJ SOLN
10.0000 mL | INTRAMUSCULAR | Status: DC | PRN
  Administered 2015-02-21: 10 mL
  Filled 2015-02-21: qty 10

## 2015-02-21 MED ORDER — SODIUM CHLORIDE 0.9 % IV SOLN
Freq: Once | INTRAVENOUS | Status: AC
  Administered 2015-02-21: 12:00:00 via INTRAVENOUS
  Filled 2015-02-21: qty 4

## 2015-02-21 MED ORDER — FAMOTIDINE IN NACL 20-0.9 MG/50ML-% IV SOLN
20.0000 mg | Freq: Once | INTRAVENOUS | Status: AC
  Administered 2015-02-21: 20 mg via INTRAVENOUS

## 2015-02-21 MED ORDER — PACLITAXEL CHEMO INJECTION 300 MG/50ML
80.0000 mg/m2 | Freq: Once | INTRAVENOUS | Status: AC
  Administered 2015-02-21: 138 mg via INTRAVENOUS
  Filled 2015-02-21: qty 23

## 2015-02-21 NOTE — Patient Instructions (Signed)
[facility] Discharge Instructions for Patients Receiving Chemotherapy  Today you received the following chemotherapy agents taxol and cyramza.  To help prevent nausea and vomiting after your treatment, we encourage you to take your nausea medication.   If you develop nausea and vomiting that is not controlled by your nausea medication, call the clinic.   BELOW ARE SYMPTOMS THAT SHOULD BE REPORTED IMMEDIATELY:  *FEVER GREATER THAN 100.5 F  *CHILLS WITH OR WITHOUT FEVER  NAUSEA AND VOMITING THAT IS NOT CONTROLLED WITH YOUR NAUSEA MEDICATION  *UNUSUAL SHORTNESS OF BREATH  *UNUSUAL BRUISING OR BLEEDING  TENDERNESS IN MOUTH AND THROAT WITH OR WITHOUT PRESENCE OF ULCERS  *URINARY PROBLEMS  *BOWEL PROBLEMS  UNUSUAL RASH Items with * indicate a potential emergency and should be followed up as soon as possible.  Feel free to call the clinic you have any questions or concerns. The clinic phone number is [phone].  Please show the [place] at check-in to the Emergency Department and triage nurse.  Paclitaxel injection Qu es este medicamento? El PACLITAXEL es un agente quimioteraputico. Este medicamento acta sobre las clulas que se dividen rpidamente, como las clulas cancerosas, y finalmente provoca la muerte de estas clulas. Se utiliza en el tratamiento del cncer de [place], mama y otros tipos de cncer. Este medicamento puede ser utilizado para otros usos; si tiene alguna pregunta consulte con su proveedor de atencin mdica o con su farmacutico. Qu le debo informar a mi profesional de la salud antes de tomar este medicamento? Necesita saber si usted presenta alguno de los siguientes problemas o situaciones: -trastornos sanguneos -pulso cardiaco irregular -infeccin (especialmente infecciones virales, como varicela o herpes) -enfermedad heptica -radioterapia reciente o continua -una reaccin alrgica o inusual al paclitaxel, alcohol, aceite  de ricino polioxietilado, otros agentes quimioteraputicos, medicamentos, alimentos, colorantes o conservantes -si est embarazada o buscando quedar embarazada -si est amamantando a un beb Cmo debo utilizar este medicamento? Este medicamento se administra mediante infusin por va intravenosa. Lo administra un profesional de la salud calificado en un hospital o en un entorno clnico. Hable con su pediatra para informarse acerca del uso de este medicamento en nios. Puede requerir atencin especial. Sobredosis: Pngase en contacto inmediatamente con un centro toxicolgico o una sala de urgencia si usted cree que haya tomado demasiado medicamento. ATENCIN: [employer] es solo para usted. No comparta este medicamento con nadie. Qu sucede si me olvido de una dosis? Es importante no olvidar ninguna dosis. Informe a su mdico o a su profesional de la salud si no puede asistir a Photographer. Qu puede interactuar con este medicamento? No tome esta medicina con ninguno de los siguientes medicamentos: -disulfiram -metronidazol Esta medicina tambin puede interactuar con los siguientes medicamentos: -ciclosporina -diazepam -quetoconazol -medicamentos para incrementar los conteos sanguneos, tales como filgrastim, pegfilgrastim, sargramostim -otros agentes quimioteraputicos, tales como cisplatino, doxorrubicina, epirubicina, etopsido, teniposida, vincristina -quinidina -testosterona -vacunas -verapamilo Consulte a su mdico o a su profesional de la salud antes de tomar cualquiera de los siguientes medicamentos: -acetaminofeno -aspirina -ibuprofeno -quetoprofeno -naproxeno Puede ser que esta lista no menciona todas las posibles interacciones. Informe a su profesional de [employer]	[place] de [employer] productos a base de hierbas, medicamentos de [place] o suplementos nutritivos que est tomando. Si usted fuma, consume bebidas alcohlicas o si utiliza drogas ilegales, indqueselo tambin a su  profesional de [employer]	[place]. Algunas sustancias pueden interactuar con su medicamento. A qu debo estar atento al usar [employer]? Se supervisar su estado de  salud atentamente mientras reciba este medicamento. Tendr que hacerse anlisis de sangre importantes mientras est tomando este medicamento. Este medicamento puede hacerle sentir un Nurse, mental health. Esto es normal ya que la quimioterapia afecta tanto a las clulas sanas como a las clulas cancerosas. Si presenta alguno de los [employer], infrmelos. Sin embargo, contine con el tratamiento aun si se siente enfermo, a menos que su mdico le indique que lo suspenda. Este medicamento puede causar Chief of Staff graves. Para reducir su riesgo necesitar tomar otro(s) medicamento(s) antes del tratamiento con este medicamento. En algunos casos, podr recibir [employer] para ayudarlo con los efectos secundarios. Siga las instrucciones para usarlos. Consulte a su mdico o a su profesional de la salud si tiene fiebre, escalofros, dolor de garganta o cualquier otro sntoma de resfro o gripe. No se trate usted mismo. Este medicamento puede reducir la capacidad del cuerpo para combatir infecciones. Trate de no acercarse a personas que estn enfermas. [employer] puede aumentar el riesgo de magulladuras o sangrado. Consulte a su mdico o a su profesional de la salud si observa sangrados inusuales. Proceda con cuidado al cepillar sus dientes, usar hilo dental o Risk manager palillos para los dientes, ya que puede contraer una infeccin o Therapist, art con mayor facilidad. Si se somete a algn tratamiento dental, informe a su dentista que est [employer]. Evite tomar productos que contienen aspirina, acetaminofeno, ibuprofeno, naproxeno o quetoprofeno a menos que as lo indique su mdico. Estos productos pueden disimular la fiebre. No debe quedar embarazada mientras recibe este medicamento. Las mujeres deben informar a su  mdico si estn buscando quedar embarazadas o si creen que estn embarazadas. Existe la posibilidad de que ocurran efectos secundarios graves a un beb sin nacer. Para ms informacin hable con su profesional de la salud o su farmacutico. No debe amamantar a un beb mientras est tomando este medicamento. Para los hombres, se desaconseja tener nios mientras reciben [employer]. Qu efectos secundarios puedo tener al [employer] este medicamento? Efectos secundarios que debe informar a su mdico o a Barrister's clerk de la salud tan pronto como sea posible: Chief of Staff, como erupcin cutnea, picazn o urticarias, hinchazn de la cara, los labios o la lengua conteos sanguneos bajos - este frmaco puede reducir la cantidad de glbulos blancos, glbulos rojos y plaquetas. Su riesgo de infeccin y Ignacio. signos de infeccin - fiebre o escalofros, tos, dolor de garganta, dolor o dificultad para orinar signos de reduccin de plaquetas o sangrado - magulladuras, puntos rojos en la piel, heces de color oscuro o con aspecto alquitranado, sangrado por la nariz signos de reduccin de glbulos rojos - cansancio o debilidad inusual, desmayos, sensacin de mareo problemas respiratorios dolor en el pecho alta o baja presin sangunea llagas en la boca nuseas y vmito dolor, hinchazn, enrojecimiento o irritacin en el lugar de la [place], hormigueo, entumecimiento de las manos o los pies pulso cardiaco lento o irregular hinchazn de tobillos, pies, manosEfectos secundarios que, por lo general, no requieren atencin mdica (debe informarlos a su mdico o a su profesional de la salud si persisten o si son molestos): dolor de huesos cada total del cabello, incluyendo el cabello de la cabeza, de las [place], el vello pbico, las cejas y las pestaas cambios en el color de las uas de las manos diarrea aflojamiento de las uas de las manos prdida del apetito dolores musculares o articulares  enrojecimiento de la piel sudoracin Puede ser que esta lista no menciona todos los  posibles efectos secundarios. Comunquese a su mdico por asesoramiento mdico [employer]. Usted puede informar los efectos secundarios a la FDA por telfono al 1-800-FDA-1088. Dnde debo guardar mi medicina? Este medicamento se administra en hospitales o clnicas y no necesitar guardarlo en su domicilio. ATENCIN: Este folleto es un resumen. Puede ser que no cubra toda la posible informacin. Si usted tiene preguntas acerca de esta medicina, consulte con su mdico, su farmacutico o su profesional de Technical sales engineer.    2016, Elsevier/Gold Standard. (2014-07-19 00:00:00)  Ramucirumab injection Qu es este medicamento? RAMUCIRUMAB es un agente quimioteraputico. Se utiliza para tratar cncer de estmago, cncer de colon o de recto, o cncer de pulmn. Este [employer] un receptor de la protena especfica que se Firefighter en las clulas cancerosas y [place] clulas cancerosas. Este medicamento puede ser utilizado para otros usos; si tiene alguna pregunta consulte con su proveedor de atencin mdica o con su farmacutico. Qu le debo informar a mi profesional de la salud antes de tomar este medicamento? Necesita saber si usted presenta alguno de los siguientes problemas o situaciones: trastornos sanguneos cogulos sanguneos enfermedad cardiaca, incluyendo insuficiencia cardiaca, ataque cardiaco o dolor en el pecho (angina) alta presin sangunea infeccin (especialmente infecciones virales, como varicela o herpes) protena en la orina ciruga reciente derrame cerebral una reaccin alrgica o inusual al ramucirumab, a otros medicamentos, alimentos, colorantes o conservantes si est embarazada o buscando quedar embarazada si est amamantando a un beb Cmo debo utilizar este medicamento? Este medicamento se administra mediante infusin por va intravenosa. Lo  administra un profesional de Technical sales engineer en un hospital o en un entorno clnico. Hable con su pediatra para informarse acerca del uso de este medicamento en nios. Puede requerir atencin especial. Sobredosis: Pngase en contacto inmediatamente con un centro toxicolgico o una sala de urgencia si usted cree que haya tomado demasiado medicamento. ATENCIN: [employer] es solo para usted. No comparta este medicamento con nadie. Qu sucede si me olvido de una dosis? Es importante no olvidar ninguna dosis. Informe a su mdico o a su profesional de la salud si no puede asistir a Photographer. Qu puede interactuar con este medicamento? No se han estudiado las interacciones. Puede ser que esta lista no menciona todas las posibles interacciones. Informe a su profesional de [employer]	[place] de [employer] productos a base de hierbas, medicamentos de [place] o suplementos nutritivos que est tomando. Si usted fuma, consume bebidas alcohlicas o si utiliza drogas ilegales, indqueselo tambin a su profesional de [employer]	[place]. Algunas sustancias pueden interactuar con su medicamento. A qu debo estar atento al usar Coca-Cola? Se supervisar su condicin atentamente mientras reciba este medicamento. Tendr que revisar su presin sangunea y [employer] de [place] y [place] mientras est tomando este medicamento. Se supervisar su estado de salud atentamente mientras reciba este medicamento. [employer] puede aumentar el riesgo de magulladuras o sangrado. Consulte a su mdico o a su profesional de la salud si observa sangrados inusuales. Este medicamento puede causar raramente 'perforacin gastrointestinal' (agujeros en el estmago, intestinos o colon), un efecto secundario severo que requieren ciruga para Psychiatric nurse. Este Biomedical engineer por lo menos 28 das despus de la [place] mayor y TEFL teacher de la ciruga debe ser totalmente curado. Consulte a su mdico antes de programar una ciruga o  tratamiento dental mientras que est recibiendo [employer]. Hable con su mdico si usted ha tenido [place] recientemente o si tiene una herida  que no ha curado. No se debe quedar embarazada mientras est tomando este medicamento o durante 3 meses despus de dejar de tomarlo. Las mujeres deben informar a su mdico si estn buscando quedar embarazadas o si creen que estn embarazadas. Existe la posibilidad de efectos secundarios graves a un beb sin nacer. Para ms informacin hable con su profesional de la salud o su farmacutico. Qu efectos secundarios puedo tener al utilizar este medicamento? Efectos secundarios que debe informar a su mdico o a Barrister's clerk de la salud tan pronto como sea posible: Chief of Staff como erupcin cutnea, picazn o urticarias, problemas respiratorios, hinchazn de la cara, labios o lengua signos de infeccin - fiebre o escalofros, tos, dolor de Press photographer en el pecho u opresin en el pecho confusin mareos sensacin de desmayos o aturdimiento, cadas dolor abdominal severo nuseas, vmito severo signos y sntomas de sangrado, como heces de color oscuro o de aspecto alquitranado, heces con [place]; orina de color rojo o marrn oscuro, escupir sangre o material marrn que parece como granos de caf, manchas rojas en la piel, magulladuras o sangrado inusual del ojo, encas o nariz signos o sntomas de un cogulo sanguneo, como problemas respiratorio; cambios en la visin; dolor en el pecho; dolor de cabeza severo o repentino; dolor, hinchazn, calor en la pierna, dificultad para hablar; entumecimiento repentina de la cara, brazo o pierna sntomas de un derrame cerebral; cambios en la conciencia mental, incapacidad para hablar o mover un lado del cuerpo dificultad para caminar, mareos, prdida del equilibrio o coordinacin Efectos secundarios que, por lo general, no requieren atencin mdica (debe informarlos a su mdico o a su profesional de la salud si  persisten o si son molestos): piel sudorosa, fra estreimiento diarrea dolor de cabeza nuseas, vmito dolor estomacal pulso cardiaco inusualmente lento cansancio o debilidad inusual Puede ser que esta lista no menciona todos los posibles efectos secundarios. Comunquese a su mdico por asesoramiento mdico [employer]. Usted puede informar los efectos secundarios a la FDA por telfono al 1-800-FDA-1088. Dnde debo guardar mi medicina? Este medicamento se administra en hospitales o clnicas y no necesitar guardarlo en su domicilio. ATENCIN: Este folleto es un resumen. Puede ser que no cubra toda la posible informacin. Si usted tiene preguntas acerca de esta medicina, consulte con su mdico, su farmacutico o su profesional de Technical sales engineer.    2016, Elsevier/Gold Standard. (2014-05-01 00:00:00)

## 2015-02-21 NOTE — Assessment & Plan Note (Signed)
Patient presents to the [facility] today to receive cycle 3 of his weekly Taxol and cycle 2 of his Cyramza chemotherapy.  Patient states that he's been doing very well recently; with the exception of some occasional nausea and vomiting.  He was a little confused regarding his nausea medication instructions; so stopped taking any nausea medications.  Whatsoever.  He denies any diarrhea or constipation.  He denies any recent fevers or chills.  Carefully reviewed patient's medication list again today.  He has been instructed to discontinue the Thorazine; since it interacts with one of his chemotherapies.  He was advised to continue alternating with the Compazine, Zofran, and lorazepam on an as-needed basis.

## 2015-02-21 NOTE — Progress Notes (Signed)
SYMPTOM MANAGEMENT CLINIC   HPI: Joseph Salinas 54 y.o. male diagnosed with gastric cancer; with liver metastasis.  Patient is currently undergoing Taxol and Cyramza  chemotherapy regimen.  Patient presents to the [facility] today to receive cycle 3 of his weekly Taxol and cycle 2 of his Cyramza chemotherapy.  Patient states that he's been doing very well recently; with the exception of some occasional nausea and vomiting.  He was a little confused regarding his nausea medication instructions; so stopped taking any nausea medications.  Whatsoever.  He denies any diarrhea or constipation.  He states that he has minimal to no neuropathy at this point.  He denies any recent fevers or chills.  Blood counts obtained today revealed a WBC of 3.1, ANC 1.4, hemoglobin 8.7, and platelet count of 281.  Patient will proceed today with his chemotherapy as planned.  The plan is for the patient to return on 02/28/2015 for labs and his final cycle of Taxol chemotherapy.  Patient will also need to be scheduled for labs, visit, and Cyramza chemotherapy on 03/06/2015 as well.  HPI  ROS  Past Medical History  Diagnosis Date  . Colon polyp   . Family history of colon cancer   . Allergy   . Gastric cancer ([facility]) 08/09/2012    Past Surgical History  Procedure Laterality Date  . No prior surgery    . Colonoscopy    . Insertion of port a cath Right 08/08/2012  . Esophagogastroduodenoscopy endoscopy    . Biospy  07/31/12    gastric   . Multiple extractions with alveoloplasty N/A 11/10/2012    Procedure: extraction of tooth #'s 1, 2,10,14,15,16,17,18, 23, 24, 25, 26, 32 with alveoloplasty and gross debridement of remaining teeth;  Surgeon: Lenn Cal, DDS;  Location: [facility];  Service: Oral Surgery;  Laterality: N/A;    has Hx of adenomatous colonic polyps; Gastric cancer ([place]); Chronic periodontitis; Family history of colon cancer; Genetic testing; Nausea with vomiting; Insomnia; and Vitamin D  deficiency on his problem list.    is allergic to penicillins.    Medication List       This list is accurate as of: 02/21/15 11:31 AM.  Always use your most recent med list.               dexamethasone 2 MG tablet  Commonly known as:  DECADRON  Take 5 tablets (10 mg) PO at 10PM the night before and 5 tabs PO 6AM the morning of chemo.     docusate sodium 50 MG capsule  Commonly known as:  COLACE  Take 1 capsule (50 mg total) by mouth 2 (two) times daily.     HYDROcodone-acetaminophen 5-325 MG tablet  Commonly known as:  NORCO  Take 1-2 tablets by mouth every 6 (six) hours as needed for moderate pain.     lidocaine-prilocaine cream  Commonly known as:  EMLA  Apply 1 application topically as needed. Apply 1 tsp to PAC site 1-2 hours prior to stick and cover with plastic wrap to numb site     ondansetron 8 MG tablet  Commonly known as:  ZOFRAN  Take 1 tablet (8 mg total) by mouth every 8 (eight) hours as needed for nausea or vomiting.     pantoprazole 40 MG tablet  Commonly known as:  PROTONIX  Take 1 tablet (40 mg total) by mouth 2 (two) times daily.     prochlorperazine 10 MG tablet  Commonly known as:  COMPAZINE  Take 1 tablet (10 mg total) by mouth every 6 (six) hours as needed (nausea).     zolpidem 10 MG tablet  Commonly known as:  AMBIEN  Take 1 tablet (10 mg total) by mouth at bedtime as needed for sleep.         PHYSICAL EXAMINATION  Oncology Vitals 02/21/2015 02/14/2015  Height 164 cm -  Weight 65.635 kg -  Weight (lbs) 144 lbs 11 oz -  BMI (kg/m2) 24.45 kg/m2 -  Temp 98.4 98.5  Pulse 71 71  Resp 18 18  SpO2 100 100  BSA (m2) 1.73 m2 -   BP Readings from Last 2 Encounters:  02/21/15 124/80  02/14/15 117/70    Physical Exam  Constitutional: He is oriented to person, place, and time and well-developed, well-nourished, and in no distress.  HENT:  Head: Normocephalic and atraumatic.  Mouth/Throat: Oropharynx is clear and moist.  Eyes:  Conjunctivae and EOM are normal. Pupils are equal, round, and reactive to light. Right eye exhibits no discharge. Left eye exhibits no discharge. No scleral icterus.  Neck: Normal range of motion.  Pulmonary/Chest: Effort normal. No respiratory distress.  Musculoskeletal: Normal range of motion. He exhibits no edema or tenderness.  Neurological: He is alert and oriented to person, place, and time. Gait normal.  Skin: Skin is warm and dry. No rash noted. No erythema. There is pallor.  Psychiatric: Affect normal.  Nursing note and vitals reviewed.   LABORATORY DATA:. Appointment on 02/21/2015  Component Date Value Ref Range Status  . WBC 02/21/2015 3.1* 4.0 - 10.3 10e3/uL Final  . NEUT# 02/21/2015 1.4* 1.5 - 6.5 10e3/uL Final  . HGB 02/21/2015 8.7* 13.0 - 17.1 g/dL Final  . HCT 02/21/2015 28.8* 38.4 - 49.9 % Final  . Platelets 02/21/2015 281  140 - 400 10e3/uL Final  . MCV 02/21/2015 70.1* 79.3 - 98.0 fL Final  . MCH 02/21/2015 21.2* 27.2 - 33.4 pg Final  . MCHC 02/21/2015 30.3* 32.0 - 36.0 g/dL Final  . RBC 02/21/2015 4.12* 4.20 - 5.82 10e6/uL Final  . RDW 02/21/2015 21.7* 11.0 - 14.6 % Final  . lymph# 02/21/2015 1.3  0.9 - 3.3 10e3/uL Final  . MONO# 02/21/2015 0.3  0.1 - 0.9 10e3/uL Final  . Eosinophils Absolute 02/21/2015 0.1  0.0 - 0.5 10e3/uL Final  . Basophils Absolute 02/21/2015 0.1  0.0 - 0.1 10e3/uL Final  . NEUT% 02/21/2015 44.1  39.0 - 75.0 % Final  . LYMPH% 02/21/2015 42.0  14.0 - 49.0 % Final  . MONO% 02/21/2015 10.2  0.0 - 14.0 % Final  . EOS% 02/21/2015 1.9  0.0 - 7.0 % Final  . BASO% 02/21/2015 1.8  0.0 - 2.0 % Final  . Sodium 02/21/2015 140  136 - 145 mEq/L Final  . Potassium 02/21/2015 4.3  3.5 - 5.1 mEq/L Final  . Chloride 02/21/2015 110* 98 - 109 mEq/L Final  . CO2 02/21/2015 23  22 - 29 mEq/L Final  . Glucose 02/21/2015 95  70 - 140 mg/dl Final   Glucose reference range is for nonfasting patients. Fasting glucose reference range is 70- 100.  Marland Kitchen BUN 02/21/2015  10.6  7.0 - 26.0 mg/dL Final  . Creatinine 02/21/2015 0.8  0.7 - 1.3 mg/dL Final  . Total Bilirubin 02/21/2015 <0.30  0.20 - 1.20 mg/dL Final  . Alkaline Phosphatase 02/21/2015 67  40 - 150 U/L Final  . AST 02/21/2015 11  5 - 34 U/L Final  . ALT 02/21/2015 <9  0 -  55 U/L Final  . Total Protein 02/21/2015 6.4  6.4 - 8.3 g/dL Final  . Albumin 02/21/2015 3.3* 3.5 - 5.0 g/dL Final  . Calcium 02/21/2015 8.8  8.4 - 10.4 mg/dL Final  . Anion Gap 02/21/2015 7  3 - 11 mEq/L Final  . EGFR 02/21/2015 >90  >90 ml/min/1.73 m2 Final   eGFR is calculated using the CKD-EPI Creatinine Equation (2009)     RADIOGRAPHIC STUDIES: No results found.  ASSESSMENT/PLAN:    Gastric cancer Patient presents to the [facility] today to receive cycle 3 of his weekly Taxol and cycle 2 of his Cyramza chemotherapy.  Patient states that he's been doing very well recently; with the exception of some occasional nausea and vomiting.  He was a little confused regarding his nausea medication instructions; so stopped taking any nausea medications.  Whatsoever.  He denies any diarrhea or constipation.  He denies any recent fevers or chills.  Blood counts obtained today revealed a WBC of 3.1, ANC 1.4, hemoglobin 8.7, and platelet count of 281.  Patient will proceed today with his chemotherapy as planned.  The plan is for the patient to return on 02/28/2015 for labs and his final cycle of Taxol chemotherapy.  Patient will also need to be scheduled for labs, visit, and Cyramza chemotherapy on 03/06/2015 as well.    Nausea with vomiting Patient presents to the [facility] today to receive cycle 3 of his weekly Taxol and cycle 2 of his Cyramza chemotherapy.  Patient states that he's been doing very well recently; with the exception of some occasional nausea and vomiting.  He was a little confused regarding his nausea medication instructions; so stopped taking any nausea medications.  Whatsoever.  He denies any diarrhea or  constipation.  He denies any recent fevers or chills.  Carefully reviewed patient's medication list again today.  He has been instructed to discontinue the Thorazine; since it interacts with one of his chemotherapies.  He was advised to continue alternating with the Compazine, Zofran, and lorazepam on an as-needed basis.       Patient stated understanding of all instructions; and was in agreement with this plan of care. The patient knows to call the clinic with any problems, questions or concerns.   Review/collaboration with Dr. Benay Spice regarding all aspects of patient's visit today.   Total time spent with patient was 25 minutes;  with greater than 75 percent of that time spent in face to face counseling regarding patient's symptoms,  and coordination of care and follow up.  Disclaimer:This dictation was prepared with Dragon/digital dictation along with [employer]. Any transcriptional errors that result from this process are unintentional.  Drue Second, NP 02/21/2015

## 2015-02-21 NOTE — Telephone Encounter (Signed)
Appointments added per pof and patient will get a new avs in chemo °

## 2015-02-21 NOTE — Progress Notes (Signed)
OK to tx with ANC 1.4 per Dr. Benay Spice.

## 2015-02-21 NOTE — Assessment & Plan Note (Signed)
Patient presents to the [facility] today to receive cycle 3 of his weekly Taxol and cycle 2 of his Cyramza chemotherapy.  Patient states that he's been doing very well recently; with the exception of some occasional nausea and vomiting.  He was a little confused regarding his nausea medication instructions; so stopped taking any nausea medications.  Whatsoever.  He denies any diarrhea or constipation.  He denies any recent fevers or chills.  Blood counts obtained today revealed a WBC of 3.1, ANC 1.4, hemoglobin 8.7, and platelet count of 281.  Patient will proceed today with his chemotherapy as planned.  The plan is for the patient to return on 02/28/2015 for labs and his final cycle of Taxol chemotherapy.  Patient will also need to be scheduled for labs, visit, and Cyramza chemotherapy on 03/06/2015 as well.

## 2015-02-23 ENCOUNTER — Other Ambulatory Visit: Payer: Self-pay | Admitting: Oncology

## 2015-02-28 ENCOUNTER — Other Ambulatory Visit: Payer: Self-pay | Admitting: *Deleted

## 2015-02-28 ENCOUNTER — Ambulatory Visit (HOSPITAL_BASED_OUTPATIENT_CLINIC_OR_DEPARTMENT_OTHER): Payer: Medicare Other

## 2015-02-28 ENCOUNTER — Other Ambulatory Visit (HOSPITAL_BASED_OUTPATIENT_CLINIC_OR_DEPARTMENT_OTHER): Payer: Medicare Other

## 2015-02-28 VITALS — BP 142/78 | HR 65 | Temp 98.3°F | Resp 18

## 2015-02-28 DIAGNOSIS — C786 Secondary malignant neoplasm of retroperitoneum and peritoneum: Secondary | ICD-10-CM

## 2015-02-28 DIAGNOSIS — C169 Malignant neoplasm of stomach, unspecified: Secondary | ICD-10-CM

## 2015-02-28 DIAGNOSIS — D509 Iron deficiency anemia, unspecified: Secondary | ICD-10-CM

## 2015-02-28 DIAGNOSIS — Z5111 Encounter for antineoplastic chemotherapy: Secondary | ICD-10-CM | POA: Diagnosis not present

## 2015-02-28 LAB — CBC WITH DIFFERENTIAL/PLATELET
BASO%: 1.7 % (ref 0.0–2.0)
BASOS ABS: 0.1 10*3/uL (ref 0.0–0.1)
EOS%: 1.4 % (ref 0.0–7.0)
Eosinophils Absolute: 0 10*3/uL (ref 0.0–0.5)
HEMATOCRIT: 28.5 % — AB (ref 38.4–49.9)
HEMOGLOBIN: 8.7 g/dL — AB (ref 13.0–17.1)
LYMPH#: 1.3 10*3/uL (ref 0.9–3.3)
LYMPH%: 43.5 % (ref 14.0–49.0)
MCH: 21.6 pg — ABNORMAL LOW (ref 27.2–33.4)
MCHC: 30.6 g/dL — ABNORMAL LOW (ref 32.0–36.0)
MCV: 70.6 fL — ABNORMAL LOW (ref 79.3–98.0)
MONO#: 0.3 10*3/uL (ref 0.1–0.9)
MONO%: 9.4 % (ref 0.0–14.0)
NEUT#: 1.3 10*3/uL — ABNORMAL LOW (ref 1.5–6.5)
NEUT%: 44 % (ref 39.0–75.0)
PLATELETS: 272 10*3/uL (ref 140–400)
RBC: 4.03 10*6/uL — ABNORMAL LOW (ref 4.20–5.82)
RDW: 22.3 % — AB (ref 11.0–14.6)
WBC: 2.9 10*3/uL — ABNORMAL LOW (ref 4.0–10.3)

## 2015-02-28 MED ORDER — HEPARIN SOD (PORK) LOCK FLUSH 100 UNIT/ML IV SOLN
500.0000 [IU] | Freq: Once | INTRAVENOUS | Status: AC | PRN
  Administered 2015-02-28: 500 [IU]
  Filled 2015-02-28: qty 5

## 2015-02-28 MED ORDER — DIPHENHYDRAMINE HCL 50 MG/ML IJ SOLN
25.0000 mg | Freq: Once | INTRAMUSCULAR | Status: AC
  Administered 2015-02-28: 25 mg via INTRAVENOUS

## 2015-02-28 MED ORDER — SODIUM CHLORIDE 0.9 % IJ SOLN
10.0000 mL | INTRAMUSCULAR | Status: DC | PRN
  Administered 2015-02-28: 10 mL
  Filled 2015-02-28: qty 10

## 2015-02-28 MED ORDER — PACLITAXEL CHEMO INJECTION 300 MG/50ML
80.0000 mg/m2 | Freq: Once | INTRAVENOUS | Status: AC
  Administered 2015-02-28: 138 mg via INTRAVENOUS
  Filled 2015-02-28: qty 23

## 2015-02-28 MED ORDER — SODIUM CHLORIDE 0.9 % IV SOLN
Freq: Once | INTRAVENOUS | Status: AC
  Administered 2015-02-28: 09:00:00 via INTRAVENOUS

## 2015-02-28 MED ORDER — FAMOTIDINE IN NACL 20-0.9 MG/50ML-% IV SOLN
20.0000 mg | Freq: Once | INTRAVENOUS | Status: AC
  Administered 2015-02-28: 20 mg via INTRAVENOUS

## 2015-02-28 MED ORDER — SODIUM CHLORIDE 0.9 % IV SOLN
Freq: Once | INTRAVENOUS | Status: AC
  Administered 2015-02-28: 10:00:00 via INTRAVENOUS
  Filled 2015-02-28: qty 4

## 2015-02-28 MED ORDER — DIPHENHYDRAMINE HCL 50 MG/ML IJ SOLN
INTRAMUSCULAR | Status: AC
  Filled 2015-02-28: qty 1

## 2015-02-28 MED ORDER — FAMOTIDINE IN NACL 20-0.9 MG/50ML-% IV SOLN
INTRAVENOUS | Status: AC
  Filled 2015-02-28: qty 50

## 2015-02-28 NOTE — Progress Notes (Signed)
Labs reviewed by Dr. Benay Spice and patient is ok to treat today per Jerman Tinnon H RN.

## 2015-02-28 NOTE — Patient Instructions (Addendum)
[facility] Discharge Instructions for Patients Receiving Chemotherapy  Today you received the following chemotherapy agents Taxol   To help prevent nausea and vomiting after your treatment, we encourage you to take your nausea medication as directed.   If you develop nausea and vomiting that is not controlled by your nausea medication, call the clinic.   BELOW ARE SYMPTOMS THAT SHOULD BE REPORTED IMMEDIATELY:  *FEVER GREATER THAN 100.5 F  *CHILLS WITH OR WITHOUT FEVER  NAUSEA AND VOMITING THAT IS NOT CONTROLLED WITH YOUR NAUSEA MEDICATION  *UNUSUAL SHORTNESS OF BREATH  *UNUSUAL BRUISING OR BLEEDING  TENDERNESS IN MOUTH AND THROAT WITH OR WITHOUT PRESENCE OF ULCERS  *URINARY PROBLEMS  *BOWEL PROBLEMS  UNUSUAL RASH Items with * indicate a potential emergency and should be followed up as soon as possible.  Feel free to call the clinic you have any questions or concerns. The clinic phone number is [phone].  Please show the CHEMO ALERT CARD at check-in to the Emergency Department and triage nurse.   

## 2015-03-05 ENCOUNTER — Other Ambulatory Visit: Payer: Self-pay | Admitting: Nurse Practitioner

## 2015-03-05 ENCOUNTER — Other Ambulatory Visit: Payer: Self-pay | Admitting: Oncology

## 2015-03-05 ENCOUNTER — Other Ambulatory Visit: Payer: Self-pay | Admitting: *Deleted

## 2015-03-05 DIAGNOSIS — C169 Malignant neoplasm of stomach, unspecified: Secondary | ICD-10-CM

## 2015-03-06 ENCOUNTER — Ambulatory Visit: Payer: Self-pay | Admitting: Nurse Practitioner

## 2015-03-06 ENCOUNTER — Telehealth: Payer: Self-pay | Admitting: *Deleted

## 2015-03-06 ENCOUNTER — Ambulatory Visit (HOSPITAL_BASED_OUTPATIENT_CLINIC_OR_DEPARTMENT_OTHER): Payer: Medicare Other | Admitting: Nurse Practitioner

## 2015-03-06 ENCOUNTER — Other Ambulatory Visit: Payer: Self-pay

## 2015-03-06 ENCOUNTER — Other Ambulatory Visit (HOSPITAL_BASED_OUTPATIENT_CLINIC_OR_DEPARTMENT_OTHER): Payer: Medicare Other

## 2015-03-06 ENCOUNTER — Ambulatory Visit: Payer: Medicare Other

## 2015-03-06 ENCOUNTER — Ambulatory Visit (HOSPITAL_BASED_OUTPATIENT_CLINIC_OR_DEPARTMENT_OTHER): Payer: Medicare Other

## 2015-03-06 ENCOUNTER — Encounter: Payer: Self-pay | Admitting: Nurse Practitioner

## 2015-03-06 ENCOUNTER — Encounter: Payer: Self-pay | Admitting: *Deleted

## 2015-03-06 ENCOUNTER — Telehealth: Payer: Self-pay | Admitting: Nurse Practitioner

## 2015-03-06 VITALS — BP 125/79 | HR 73 | Temp 98.3°F | Resp 18 | Ht 64.5 in | Wt 146.9 lb

## 2015-03-06 DIAGNOSIS — D701 Agranulocytosis secondary to cancer chemotherapy: Secondary | ICD-10-CM | POA: Insufficient documentation

## 2015-03-06 DIAGNOSIS — Z95828 Presence of other vascular implants and grafts: Secondary | ICD-10-CM

## 2015-03-06 DIAGNOSIS — C169 Malignant neoplasm of stomach, unspecified: Secondary | ICD-10-CM

## 2015-03-06 DIAGNOSIS — Z5112 Encounter for antineoplastic immunotherapy: Secondary | ICD-10-CM

## 2015-03-06 DIAGNOSIS — T451X5A Adverse effect of antineoplastic and immunosuppressive drugs, initial encounter: Secondary | ICD-10-CM

## 2015-03-06 DIAGNOSIS — C162 Malignant neoplasm of body of stomach: Secondary | ICD-10-CM

## 2015-03-06 LAB — COMPREHENSIVE METABOLIC PANEL
ALT: 11 U/L (ref 0–55)
AST: 11 U/L (ref 5–34)
Albumin: 3.5 g/dL (ref 3.5–5.0)
Alkaline Phosphatase: 65 U/L (ref 40–150)
Anion Gap: 6 mEq/L (ref 3–11)
BUN: 7.1 mg/dL (ref 7.0–26.0)
CHLORIDE: 109 meq/L (ref 98–109)
CO2: 24 meq/L (ref 22–29)
Calcium: 8.7 mg/dL (ref 8.4–10.4)
Creatinine: 0.8 mg/dL (ref 0.7–1.3)
GLUCOSE: 109 mg/dL (ref 70–140)
Potassium: 4.1 mEq/L (ref 3.5–5.1)
SODIUM: 139 meq/L (ref 136–145)
TOTAL PROTEIN: 6.6 g/dL (ref 6.4–8.3)

## 2015-03-06 LAB — CBC WITH DIFFERENTIAL/PLATELET
BASO%: 1.5 % (ref 0.0–2.0)
Basophils Absolute: 0 10*3/uL (ref 0.0–0.1)
EOS%: 1.1 % (ref 0.0–7.0)
Eosinophils Absolute: 0 10*3/uL (ref 0.0–0.5)
HCT: 28.4 % — ABNORMAL LOW (ref 38.4–49.9)
HGB: 8.6 g/dL — ABNORMAL LOW (ref 13.0–17.1)
LYMPH%: 51.5 % — AB (ref 14.0–49.0)
MCH: 22 pg — ABNORMAL LOW (ref 27.2–33.4)
MCHC: 30.3 g/dL — ABNORMAL LOW (ref 32.0–36.0)
MCV: 72.6 fL — AB (ref 79.3–98.0)
MONO#: 0.2 10*3/uL (ref 0.1–0.9)
MONO%: 8.1 % (ref 0.0–14.0)
NEUT%: 37.8 % — ABNORMAL LOW (ref 39.0–75.0)
NEUTROS ABS: 1 10*3/uL — AB (ref 1.5–6.5)
Platelets: 261 10*3/uL (ref 140–400)
RBC: 3.91 10*6/uL — AB (ref 4.20–5.82)
RDW: 21.8 % — ABNORMAL HIGH (ref 11.0–14.6)
WBC: 2.7 10*3/uL — AB (ref 4.0–10.3)
lymph#: 1.4 10*3/uL (ref 0.9–3.3)

## 2015-03-06 MED ORDER — SODIUM CHLORIDE 0.9 % IJ SOLN
10.0000 mL | INTRAMUSCULAR | Status: DC | PRN
  Administered 2015-03-06: 10 mL via INTRAVENOUS
  Filled 2015-03-06: qty 10

## 2015-03-06 MED ORDER — SODIUM CHLORIDE 0.9 % IJ SOLN
10.0000 mL | INTRAMUSCULAR | Status: DC | PRN
  Administered 2015-03-06: 10 mL
  Filled 2015-03-06: qty 10

## 2015-03-06 MED ORDER — ACETAMINOPHEN 325 MG PO TABS
ORAL_TABLET | ORAL | Status: AC
  Filled 2015-03-06: qty 2

## 2015-03-06 MED ORDER — LIDOCAINE-PRILOCAINE 2.5-2.5 % EX CREA: 1.0000 "application " | TOPICAL_CREAM | CUTANEOUS | Status: AC | PRN

## 2015-03-06 MED ORDER — SODIUM CHLORIDE 0.9 % IV SOLN
8.0000 mg/kg | Freq: Once | INTRAVENOUS | Status: AC
  Administered 2015-03-06: 500 mg via INTRAVENOUS
  Filled 2015-03-06: qty 50

## 2015-03-06 MED ORDER — HEPARIN SOD (PORK) LOCK FLUSH 100 UNIT/ML IV SOLN
500.0000 [IU] | Freq: Once | INTRAVENOUS | Status: AC | PRN
  Administered 2015-03-06: 500 [IU]
  Filled 2015-03-06: qty 5

## 2015-03-06 MED ORDER — SODIUM CHLORIDE 0.9 % IV SOLN
Freq: Once | INTRAVENOUS | Status: AC
  Administered 2015-03-06: 13:00:00 via INTRAVENOUS

## 2015-03-06 MED ORDER — ACETAMINOPHEN 325 MG PO TABS
650.0000 mg | ORAL_TABLET | Freq: Once | ORAL | Status: AC
  Administered 2015-03-06: 650 mg via ORAL

## 2015-03-06 NOTE — Assessment & Plan Note (Signed)
The previously was 2.7 and ANC was 1.0 today.  Most likely, this is chemotherapy 3-induced neutropenia.  Patient denies any recent fever or chills; and patient was afebrile at the [facility] today.  Really reviewed all neutropenia guidelines with both patient and his wife today.  Patient will proceed with his antibody infusion today.  He was advised to call/return to the directly to the emergency department for any worsening symptoms whatsoever.

## 2015-03-06 NOTE — Assessment & Plan Note (Signed)
Patient received cycle 4 of his weekly Taxol chemotherapy on 02/28/2015.  He presented to the [facility] today to receive the Cyramza antibody infusion.  Patient states that he's been doing very well within the last week or so.  He states that all of his nausea, vomiting, and abdominal discomfort have completely resolved.  He denies any diarrhea or constipation.  He states that he is eating much better and his energy level has improved as well.  He denies any oral lesions.  He also denies any neuropathy issues.  He denies any recent fevers or chills.  Blood counts obtained today revealed a WBC of 2.7, ANC 1.0, hemoglobin 8.6, and platelet count 261.  Patient is afebrile today on exam.  Since patient is receiving the antibody infusion only today-patient will be able to proceed with his Cyramza infusion as planned today.  Since patient has a fairly complicated chemotherapy regimen schedule- patient's chemotherapy/antibody regimen was written out for them.  Also, patient is requesting to take a break surrounding the new year holidays; since he has a large amount of family coming in to celebrate.  Schedule changes surrounding the holidays will include: Patient will return for labs, visit, and Taxol only on 03/14/2015.  Patient will hold all treatment at all.  Appointments will be canceled for 03/21/2015.  Patient will return on 03/28/2015 for labs, visit, and will be due for both Taxol and Cyramza antibody infusion.  Also, clarification of regimen: Patient receives Taxol every week for 4 weeks in a row; and then takes one week off.  Patient receives the Cyramza antibody infusion on an every two-week basis.  Confirmed with both patient and his wife that they had a better understanding of chemotherapy regimen and plan to schedule over the holidays.

## 2015-03-06 NOTE — Patient Instructions (Signed)
[facility] Discharge Instructions for Patients Receiving Chemotherapy  Today you received the following chemotherapy agents Cyramza  To help prevent nausea and vomiting after your treatment, we encourage you to take your nausea medication Compazine 10 mg every 6 hours as needed, If you develop nausea and vomiting that is not controlled by your nausea medication, call the clinic.   BELOW ARE SYMPTOMS THAT SHOULD BE REPORTED IMMEDIATELY:  *FEVER GREATER THAN 100.5 F  *CHILLS WITH OR WITHOUT FEVER  NAUSEA AND VOMITING THAT IS NOT CONTROLLED WITH YOUR NAUSEA MEDICATION  *UNUSUAL SHORTNESS OF BREATH  *UNUSUAL BRUISING OR BLEEDING  TENDERNESS IN MOUTH AND THROAT WITH OR WITHOUT PRESENCE OF ULCERS  *URINARY PROBLEMS  *BOWEL PROBLEMS  UNUSUAL RASH Items with * indicate a potential emergency and should be followed up as soon as possible.  Feel free to call the clinic you have any questions or concerns. The clinic phone number is [phone].  Please show the [facility] at check-in to the Emergency Department and triage nurse.

## 2015-03-06 NOTE — Patient Instructions (Signed)

## 2015-03-06 NOTE — Telephone Encounter (Signed)
pwer po fto sch pt appt-sent MW email to sch trmt-pt to get updated copy of avs

## 2015-03-06 NOTE — Progress Notes (Signed)
SYMPTOM MANAGEMENT CLINIC   HPI: Joseph Salinas 54 y.o. male diagnosed with gastric cancer; with liver metastasis.  Currently undergoing Taxol/Cyramza chemotherapy regimen.  Patient received cycle 4 of his weekly Taxol chemotherapy on 02/28/2015.  He presented to the [facility] today to receive the Cyramza antibody infusion.  Patient states that he's been doing very well within the last week or so.  He states that all of his nausea, vomiting, and abdominal discomfort have completely resolved.  He denies any diarrhea or constipation.  He states that he is eating much better and his energy level has improved as well.  He denies any oral lesions.  He also denies any neuropathy issues.  He denies any recent fevers or chills.  HPI  ROS  Past Medical History  Diagnosis Date  . Colon polyp   . Family history of colon cancer   . Allergy   . Gastric cancer ([facility]) 08/09/2012    Past Surgical History  Procedure Laterality Date  . No prior surgery    . Colonoscopy    . Insertion of port a cath Right 08/08/2012  . Esophagogastroduodenoscopy endoscopy    . Biospy  07/31/12    gastric   . Multiple extractions with alveoloplasty N/A 11/10/2012    Procedure: extraction of tooth #'s 1, 2,10,14,15,16,17,18, 23, 24, 25, 26, 32 with alveoloplasty and gross debridement of remaining teeth;  Surgeon: Lenn Cal, DDS;  Location: [facility];  Service: Oral Surgery;  Laterality: N/A;    has Hx of adenomatous colonic polyps; Gastric cancer ([facility]); Chronic periodontitis; Family history of colon cancer; Genetic testing; Nausea with vomiting; Insomnia; Vitamin D deficiency; and Chemotherapy induced neutropenia ([facility]) on his problem list.    is allergic to penicillins.    Medication List       This list is accurate as of: 03/06/15 12:48 PM.  Always use your most recent med list.               dexamethasone 2 MG tablet  Commonly known as:  DECADRON  Take 5 tablets (10 mg) PO at 10PM the night  before and 5 tabs PO 6AM the morning of chemo.     docusate sodium 50 MG capsule  Commonly known as:  COLACE  Take 1 capsule (50 mg total) by mouth 2 (two) times daily.     HYDROcodone-acetaminophen 5-325 MG tablet  Commonly known as:  NORCO  Take 1-2 tablets by mouth every 6 (six) hours as needed for moderate pain.     lidocaine-prilocaine cream  Commonly known as:  EMLA  Apply 1 application topically as needed. Apply 1 tsp to PAC site 1-2 hours prior to stick and cover with plastic wrap to numb site     ondansetron 8 MG tablet  Commonly known as:  ZOFRAN  Take 1 tablet (8 mg total) by mouth every 8 (eight) hours as needed for nausea or vomiting.     pantoprazole 40 MG tablet  Commonly known as:  PROTONIX  Take 1 tablet (40 mg total) by mouth 2 (two) times daily.     prochlorperazine 10 MG tablet  Commonly known as:  COMPAZINE  Take 1 tablet (10 mg total) by mouth every 6 (six) hours as needed (nausea).     zolpidem 10 MG tablet  Commonly known as:  AMBIEN  Take 1 tablet (10 mg total) by mouth at bedtime as needed for sleep.         PHYSICAL EXAMINATION  Oncology  Vitals 03/06/2015 02/28/2015  Height 164 cm -  Weight 66.633 kg -  Weight (lbs) 146 lbs 14 oz -  BMI (kg/m2) 24.83 kg/m2 -  Temp 98.3 98.3  Pulse 73 65  Resp 18 18  SpO2 100 100  BSA (m2) 1.74 m2 -   BP Readings from Last 2 Encounters:  03/06/15 125/79  02/28/15 142/78    Physical Exam  Constitutional: He is oriented to person, place, and time and well-developed, well-nourished, and in no distress.  HENT:  Head: Normocephalic and atraumatic.  Mouth/Throat: Oropharynx is clear and moist.  Eyes: Conjunctivae and EOM are normal. Pupils are equal, round, and reactive to light. Right eye exhibits no discharge. Left eye exhibits no discharge. No scleral icterus.  Neck: Normal range of motion. Neck supple. No JVD present. No tracheal deviation present. No thyromegaly present.  Cardiovascular: Normal rate,  regular rhythm, normal heart sounds and intact distal pulses.   Pulmonary/Chest: Effort normal and breath sounds normal. No respiratory distress. He has no wheezes. He has no rales. He exhibits no tenderness.  Abdominal: Soft. Bowel sounds are normal. He exhibits no distension and no mass. There is no tenderness. There is no rebound and no guarding.  Musculoskeletal: Normal range of motion. He exhibits no edema or tenderness.  Lymphadenopathy:    He has no cervical adenopathy.  Neurological: He is alert and oriented to person, place, and time. Gait normal.  Skin: Skin is warm and dry. No rash noted. No erythema. No pallor.  Psychiatric: Affect normal.  Nursing note and vitals reviewed.   LABORATORY DATA:. Appointment on 03/06/2015  Component Date Value Ref Range Status  . WBC 03/06/2015 2.7* 4.0 - 10.3 10e3/uL Final  . NEUT# 03/06/2015 1.0* 1.5 - 6.5 10e3/uL Final  . HGB 03/06/2015 8.6* 13.0 - 17.1 g/dL Final  . HCT 03/06/2015 28.4* 38.4 - 49.9 % Final  . Platelets 03/06/2015 261  140 - 400 10e3/uL Final  . MCV 03/06/2015 72.6* 79.3 - 98.0 fL Final  . MCH 03/06/2015 22.0* 27.2 - 33.4 pg Final  . MCHC 03/06/2015 30.3* 32.0 - 36.0 g/dL Final  . RBC 03/06/2015 3.91* 4.20 - 5.82 10e6/uL Final  . RDW 03/06/2015 21.8* 11.0 - 14.6 % Final  . lymph# 03/06/2015 1.4  0.9 - 3.3 10e3/uL Final  . MONO# 03/06/2015 0.2  0.1 - 0.9 10e3/uL Final  . Eosinophils Absolute 03/06/2015 0.0  0.0 - 0.5 10e3/uL Final  . Basophils Absolute 03/06/2015 0.0  0.0 - 0.1 10e3/uL Final  . NEUT% 03/06/2015 37.8* 39.0 - 75.0 % Final  . LYMPH% 03/06/2015 51.5* 14.0 - 49.0 % Final  . MONO% 03/06/2015 8.1  0.0 - 14.0 % Final  . EOS% 03/06/2015 1.1  0.0 - 7.0 % Final  . BASO% 03/06/2015 1.5  0.0 - 2.0 % Final  . Sodium 03/06/2015 139  136 - 145 mEq/L Final  . Potassium 03/06/2015 4.1  3.5 - 5.1 mEq/L Final  . Chloride 03/06/2015 109  98 - 109 mEq/L Final  . CO2 03/06/2015 24  22 - 29 mEq/L Final  . Glucose 03/06/2015  109  70 - 140 mg/dl Final   Glucose reference range is for nonfasting patients. Fasting glucose reference range is 70- 100.  Marland Kitchen BUN 03/06/2015 7.1  7.0 - 26.0 mg/dL Final  . Creatinine 03/06/2015 0.8  0.7 - 1.3 mg/dL Final  . Total Bilirubin 03/06/2015 <0.30  0.20 - 1.20 mg/dL Final  . Alkaline Phosphatase 03/06/2015 65  40 - 150 U/L Final  .  AST 03/06/2015 11  5 - 34 U/L Final  . ALT 03/06/2015 11  0 - 55 U/L Final  . Total Protein 03/06/2015 6.6  6.4 - 8.3 g/dL Final  . Albumin 03/06/2015 3.5  3.5 - 5.0 g/dL Final  . Calcium 03/06/2015 8.7  8.4 - 10.4 mg/dL Final  . Anion Gap 03/06/2015 6  3 - 11 mEq/L Final  . EGFR 03/06/2015 >90  >90 ml/min/1.73 m2 Final   eGFR is calculated using the CKD-EPI Creatinine Equation (2009)     RADIOGRAPHIC STUDIES: No results found.  ASSESSMENT/PLAN:    Gastric cancer Patient received cycle 4 of his weekly Taxol chemotherapy on 02/28/2015.  He presented to the [facility] today to receive the Cyramza antibody infusion.  Patient states that he's been doing very well within the last week or so.  He states that all of his nausea, vomiting, and abdominal discomfort have completely resolved.  He denies any diarrhea or constipation.  He states that he is eating much better and his energy level has improved as well.  He denies any oral lesions.  He also denies any neuropathy issues.  He denies any recent fevers or chills.  Blood counts obtained today revealed a WBC of 2.7, ANC 1.0, hemoglobin 8.6, and platelet count 261.  Patient is afebrile today on exam.  Since patient is receiving the antibody infusion only today-patient will be able to proceed with his Cyramza infusion as planned today.  Since patient has a fairly complicated chemotherapy regimen schedule- patient's chemotherapy/antibody regimen was written out for them.  Also, patient is requesting to take a break surrounding the new year holidays; since he has a large amount of family coming in to  celebrate.  Schedule changes surrounding the holidays will include: Patient will return for labs, visit, and Taxol only on 03/14/2015.  Patient will hold all treatment at all.  Appointments will be canceled for 03/21/2015.  Patient will return on 03/28/2015 for labs, visit, and will be due for both Taxol and Cyramza antibody infusion.  Also, clarification of regimen: Patient receives Taxol every week for 4 weeks in a row; and then takes one week off.  Patient receives the Cyramza antibody infusion on an every two-week basis.  Confirmed with both patient and his wife that they had a better understanding of chemotherapy regimen and plan to schedule over the holidays.  Chemotherapy induced neutropenia ([facility]) The previously was 2.7 and ANC was 1.0 today.  Most likely, this is chemotherapy 3-induced neutropenia.  Patient denies any recent fever or chills; and patient was afebrile at the [facility] today.  Really reviewed all neutropenia guidelines with both patient and his wife today.  Patient will proceed with his antibody infusion today.  He was advised to call/return to the directly to the emergency department for any worsening symptoms whatsoever.  Patient stated understanding of all instructions; and was in agreement with this plan of care. The patient knows to call the clinic with any problems, questions or concerns.   This was a shared visit with Dr. Benay Spice today.  Total time spent with patient was 25 minutes;  with greater than 75 percent of that time spent in face to face counseling regarding patient's symptoms,  and coordination of care and follow up.  Disclaimer:This dictation was prepared with Dragon/digital dictation along with [employer]. Any transcriptional errors that result from this process are unintentional.  Drue Second, NP 03/06/2015   This was a shared visit with Drue Second. Mr. Bramer  has an improved performance status since starting  Taxol/ramucirumab. The plan is to continue treatment. He will resume weekly Taxol 03/14/2015. He will be off of treatment for the new year holiday on 03/21/2015 and return for an office visit and treatment 03/28/2015.  Julieanne Manson, M.D.

## 2015-03-06 NOTE — Telephone Encounter (Signed)
Per staff message and POF I have scheduled appts. Advised scheduler of appts. JMW  

## 2015-03-06 NOTE — Progress Notes (Signed)
Oncology Nurse Navigator Documentation  Oncology Nurse Navigator Flowsheets 03/06/2015  Navigator Encounter Type Treatment--8 month f/u  Patient Visit Type Medonc  Treatment Phase Treatment-Taxol & Ramucirumab   Barriers/Navigation Needs No barriers at this time  Interventions Other--provided navigator contact information to call for any questions or home issues.  Time Spent with Patient 15

## 2015-03-13 ENCOUNTER — Other Ambulatory Visit: Payer: Self-pay | Admitting: *Deleted

## 2015-03-13 DIAGNOSIS — C169 Malignant neoplasm of stomach, unspecified: Secondary | ICD-10-CM

## 2015-03-14 ENCOUNTER — Ambulatory Visit: Payer: Medicare Other

## 2015-03-14 ENCOUNTER — Ambulatory Visit (HOSPITAL_BASED_OUTPATIENT_CLINIC_OR_DEPARTMENT_OTHER): Payer: Medicare Other | Admitting: Nurse Practitioner

## 2015-03-14 ENCOUNTER — Other Ambulatory Visit (HOSPITAL_COMMUNITY)
Admission: RE | Admit: 2015-03-14 | Discharge: 2015-03-14 | Disposition: A | Discharge status: Home / Self Care | Payer: Medicare Other | Source: Ambulatory Visit | Attending: Oncology | Admitting: Oncology

## 2015-03-14 ENCOUNTER — Other Ambulatory Visit (HOSPITAL_BASED_OUTPATIENT_CLINIC_OR_DEPARTMENT_OTHER): Payer: Medicare Other

## 2015-03-14 ENCOUNTER — Ambulatory Visit (HOSPITAL_BASED_OUTPATIENT_CLINIC_OR_DEPARTMENT_OTHER): Payer: Medicare Other

## 2015-03-14 ENCOUNTER — Encounter: Payer: Self-pay | Admitting: Nurse Practitioner

## 2015-03-14 VITALS — BP 138/78 | HR 77 | Temp 97.9°F | Resp 18 | Ht 64.5 in | Wt 151.4 lb

## 2015-03-14 DIAGNOSIS — C169 Malignant neoplasm of stomach, unspecified: Secondary | ICD-10-CM

## 2015-03-14 DIAGNOSIS — D6481 Anemia due to antineoplastic chemotherapy: Secondary | ICD-10-CM

## 2015-03-14 DIAGNOSIS — Z5111 Encounter for antineoplastic chemotherapy: Secondary | ICD-10-CM

## 2015-03-14 DIAGNOSIS — C162 Malignant neoplasm of body of stomach: Secondary | ICD-10-CM

## 2015-03-14 DIAGNOSIS — Z95828 Presence of other vascular implants and grafts: Secondary | ICD-10-CM

## 2015-03-14 LAB — CBC WITH DIFFERENTIAL/PLATELET
BASO%: 1.8 % (ref 0.0–2.0)
Basophils Absolute: 0.1 10*3/uL (ref 0.0–0.1)
EOS ABS: 0 10*3/uL (ref 0.0–0.5)
EOS%: 1 % (ref 0.0–7.0)
HCT: 27 % — ABNORMAL LOW (ref 38.4–49.9)
HGB: 8.3 g/dL — ABNORMAL LOW (ref 13.0–17.1)
LYMPH#: 1.4 10*3/uL (ref 0.9–3.3)
LYMPH%: 36.1 % (ref 14.0–49.0)
MCH: 21.6 pg — ABNORMAL LOW (ref 27.2–33.4)
MCHC: 30.7 g/dL — ABNORMAL LOW (ref 32.0–36.0)
MCV: 70.3 fL — ABNORMAL LOW (ref 79.3–98.0)
MONO#: 0.6 10*3/uL (ref 0.1–0.9)
MONO%: 15.6 % — ABNORMAL HIGH (ref 0.0–14.0)
NEUT%: 45.5 % (ref 39.0–75.0)
NEUTROS ABS: 1.7 10*3/uL (ref 1.5–6.5)
PLATELETS: 266 10*3/uL (ref 140–400)
RBC: 3.84 10*6/uL — AB (ref 4.20–5.82)
RDW: 22.6 % — ABNORMAL HIGH (ref 11.0–14.6)
WBC: 3.7 10*3/uL — AB (ref 4.0–10.3)

## 2015-03-14 LAB — COMPREHENSIVE METABOLIC PANEL
ALBUMIN: 3.6 g/dL (ref 3.5–5.0)
ALT: 12 U/L — AB (ref 17–63)
AST: 12 U/L — AB (ref 15–41)
Alkaline Phosphatase: 57 U/L (ref 38–126)
Anion gap: 7 (ref 5–15)
BUN: 11 mg/dL (ref 6–20)
CHLORIDE: 110 mmol/L (ref 101–111)
CO2: 26 mmol/L (ref 22–32)
CREATININE: 0.82 mg/dL (ref 0.61–1.24)
Calcium: 8.6 mg/dL — ABNORMAL LOW (ref 8.9–10.3)
GFR calc Af Amer: >60 mL/min (ref >60)
GFR calc non Af Amer: >60 mL/min (ref >60)
Glucose, Bld: 107 mg/dL — ABNORMAL HIGH (ref 65–99)
Potassium: 3.8 mmol/L (ref 3.5–5.1)
SODIUM: 143 mmol/L (ref 135–145)
Total Bilirubin: 0.5 mg/dL (ref 0.3–1.2)
Total Protein: 6.5 g/dL (ref 6.5–8.1)

## 2015-03-14 MED ORDER — SODIUM CHLORIDE 0.9 % IV SOLN
Freq: Once | INTRAVENOUS | Status: AC
  Administered 2015-03-14: 11:00:00 via INTRAVENOUS
  Filled 2015-03-14: qty 4

## 2015-03-14 MED ORDER — HEPARIN SOD (PORK) LOCK FLUSH 100 UNIT/ML IV SOLN
500.0000 [IU] | Freq: Once | INTRAVENOUS | Status: AC | PRN
  Administered 2015-03-14: 500 [IU]
  Filled 2015-03-14: qty 5

## 2015-03-14 MED ORDER — SODIUM CHLORIDE 0.9 % IJ SOLN
10.0000 mL | INTRAMUSCULAR | Status: DC | PRN
  Administered 2015-03-14: 10 mL
  Filled 2015-03-14: qty 10

## 2015-03-14 MED ORDER — FAMOTIDINE IN NACL 20-0.9 MG/50ML-% IV SOLN
20.0000 mg | Freq: Once | INTRAVENOUS | Status: AC
  Administered 2015-03-14: 20 mg via INTRAVENOUS

## 2015-03-14 MED ORDER — SODIUM CHLORIDE 0.9 % IV SOLN
Freq: Once | INTRAVENOUS | Status: AC
  Administered 2015-03-14: 11:00:00 via INTRAVENOUS

## 2015-03-14 MED ORDER — HEPARIN SOD (PORK) LOCK FLUSH 100 UNIT/ML IV SOLN
500.0000 [IU] | Freq: Once | INTRAVENOUS | Status: AC
  Administered 2015-03-14: 500 [IU] via INTRAVENOUS
  Filled 2015-03-14: qty 5

## 2015-03-14 MED ORDER — PACLITAXEL CHEMO INJECTION 300 MG/50ML
80.0000 mg/m2 | Freq: Once | INTRAVENOUS | Status: AC
  Administered 2015-03-14: 138 mg via INTRAVENOUS
  Filled 2015-03-14: qty 23

## 2015-03-14 MED ORDER — DIPHENHYDRAMINE HCL 50 MG/ML IJ SOLN
25.0000 mg | Freq: Once | INTRAMUSCULAR | Status: AC
  Administered 2015-03-14: 25 mg via INTRAVENOUS

## 2015-03-14 MED ORDER — FAMOTIDINE IN NACL 20-0.9 MG/50ML-% IV SOLN
INTRAVENOUS | Status: AC
  Filled 2015-03-14: qty 50

## 2015-03-14 MED ORDER — SODIUM CHLORIDE 0.9 % IJ SOLN
10.0000 mL | INTRAMUSCULAR | Status: DC | PRN
  Administered 2015-03-14: 10 mL via INTRAVENOUS
  Filled 2015-03-14: qty 10

## 2015-03-14 MED ORDER — DIPHENHYDRAMINE HCL 50 MG/ML IJ SOLN
INTRAMUSCULAR | Status: AC
Start: 2015-03-14 — End: 2015-03-14
  Filled 2015-03-14: qty 1

## 2015-03-14 NOTE — Patient Instructions (Signed)
[facility] Discharge Instructions for Patients Receiving Chemotherapy  Today you received the following chemotherapy agents: Taxol.  To help prevent nausea and vomiting after your treatment, we encourage you to take your nausea medication: Compazine. Take one every 6 hours as needed.   If you develop nausea and vomiting that is not controlled by your nausea medication, call the clinic.   BELOW ARE SYMPTOMS THAT SHOULD BE REPORTED IMMEDIATELY:  *FEVER GREATER THAN 100.5 F  *CHILLS WITH OR WITHOUT FEVER  NAUSEA AND VOMITING THAT IS NOT CONTROLLED WITH YOUR NAUSEA MEDICATION  *UNUSUAL SHORTNESS OF BREATH  *UNUSUAL BRUISING OR BLEEDING  TENDERNESS IN MOUTH AND THROAT WITH OR WITHOUT PRESENCE OF ULCERS  *URINARY PROBLEMS  *BOWEL PROBLEMS  UNUSUAL RASH Items with * indicate a potential emergency and should be followed up as soon as possible.  Feel free to call the clinic should you have any questions or concerns. The clinic phone number is [phone].  Please show the CHEMO ALERT CARD at check-in to the Emergency Department and triage nurse.   

## 2015-03-14 NOTE — Assessment & Plan Note (Signed)
Patient presents to the [facility] today to receive Taxol only portion of his Taxol/Cyramza chemotherapy regimen.  Patient states that he's been doing very well within the last week or so.  He states that all of his nausea, vomiting, and abdominal discomfort have completely resolved.  He denies any diarrhea or constipation.  He states that he is eating much better and his energy level has improved as well.  He denies any oral lesions.  He also denies any neuropathy issues.  He denies any recent fevers or chills.  Blood counts obtained reveal a WBC of 3.7, ANC 1.7, hemoglobin 8.3, platelet count 266.  Patient is afebrile today on exam.  Patient is experiencing some mild to moderate chemotherapy therapy-induced anemia; but denies any worsening issues with fatigue are shortness of breath with exertion.  Will continue to monitor closely.  Since patient has a fairly complicated chemotherapy regimen schedule- patient's chemotherapy/antibody regimen was written out for them.  Also, patient is requesting to take a break surrounding the new year holidays; since he has a large amount of family coming in to celebrate.  Schedule changes surrounding the holidays will include: Patient will return for labs, visit, and Taxol only on 03/14/2015.  Appointments will be canceled for 03/21/2015.  Patient will return on 03/28/2015 for labs, visit, and will be due for both Taxol and Cyramza antibody infusion.  Also, clarification of regimen: Patient receives Taxol every week for 4 weeks in a row; and then takes one week off.  Patient receives the Cyramza antibody infusion on an every two-week basis.  Confirmed with both patient and his wife that they had a better understanding of chemotherapy regimen and plan to schedule over the holidays.

## 2015-03-14 NOTE — Progress Notes (Signed)
SYMPTOM MANAGEMENT CLINIC   HPI: Joseph Salinas 54 y.o. male diagnosed with gastric cancer; with liver metastasis.  Currently undergoing Taxol/Cyramza chemotherapy regimen.  Patient presents to the [facility] today to receive Taxol only portion of his Taxol/Cyramza chemotherapy regimen.  Patient states that he's been doing very well within the last week or so.  He states that all of his nausea, vomiting, and abdominal discomfort have completely resolved.  He denies any diarrhea or constipation.  He states that he is eating much better and his energy level has improved as well.  He denies any oral lesions.  He also denies any neuropathy issues.  He denies any recent fevers or chills.  Blood counts obtained reveal a WBC of 3.7, ANC 1.7, hemoglobin 8.3, platelet count 266.  Patient is afebrile today on exam.  Patient is experiencing some mild to moderate chemotherapy therapy-induced anemia; but denies any worsening issues with fatigue are shortness of breath with exertion.  Will continue to monitor closely.  HPI  ROS  Past Medical History  Diagnosis Date  . Colon polyp   . Family history of colon cancer   . Allergy   . Gastric cancer ([place]) 08/09/2012    Past Surgical History  Procedure Laterality Date  . No prior surgery    . Colonoscopy    . Insertion of port a cath Right 08/08/2012  . Esophagogastroduodenoscopy endoscopy    . Biospy  07/31/12    gastric   . Multiple extractions with alveoloplasty N/A 11/10/2012    Procedure: extraction of tooth #'s 1, 2,10,14,15,16,17,18, 23, 24, 25, 26, 32 with alveoloplasty and gross debridement of remaining teeth;  Surgeon: Lenn Cal, DDS;  Location: [place];  Service: Oral Surgery;  Laterality: N/A;    has Hx of adenomatous colonic polyps; Gastric cancer ([place]); Chronic periodontitis; Family history of colon cancer; Genetic testing; Nausea with vomiting; Insomnia; and Vitamin D deficiency on his problem list.    is allergic to  penicillins.    Medication List       This list is accurate as of: 03/14/15  5:35 PM.  Always use your most recent med list.               dexamethasone 2 MG tablet  Commonly known as:  DECADRON  Take 5 tablets (10 mg) PO at 10PM the night before and 5 tabs PO 6AM the morning of chemo.     docusate sodium 50 MG capsule  Commonly known as:  COLACE  Take 1 capsule (50 mg total) by mouth 2 (two) times daily.     HYDROcodone-acetaminophen 5-325 MG tablet  Commonly known as:  NORCO  Take 1-2 tablets by mouth every 6 (six) hours as needed for moderate pain.     lidocaine-prilocaine cream  Commonly known as:  EMLA  Apply 1 application topically as needed. Apply 1 tsp to PAC site 1-2 hours prior to stick and cover with plastic wrap to numb site     ondansetron 8 MG tablet  Commonly known as:  ZOFRAN  Take 1 tablet (8 mg total) by mouth every 8 (eight) hours as needed for nausea or vomiting.     pantoprazole 40 MG tablet  Commonly known as:  PROTONIX  Take 1 tablet (40 mg total) by mouth 2 (two) times daily.     prochlorperazine 10 MG tablet  Commonly known as:  COMPAZINE  Take 1 tablet (10 mg total) by mouth every 6 (six) hours as needed (  nausea).     zolpidem 10 MG tablet  Commonly known as:  AMBIEN  Take 1 tablet (10 mg total) by mouth at bedtime as needed for sleep.         PHYSICAL EXAMINATION  Oncology Vitals 03/14/2015 03/06/2015  Height 164 cm 164 cm  Weight 68.675 kg 66.633 kg  Weight (lbs) 151 lbs 6 oz 146 lbs 14 oz  BMI (kg/m2) 25.59 kg/m2 24.83 kg/m2  Temp 97.9 98.3  Pulse 77 73  Resp 18 18  SpO2 100 100  BSA (m2) 1.77 m2 1.74 m2   BP Readings from Last 2 Encounters:  03/14/15 138/78  03/06/15 125/79    Physical Exam  Constitutional: He is oriented to person, place, and time and well-developed, well-nourished, and in no distress.  HENT:  Head: Normocephalic and atraumatic.  Mouth/Throat: Oropharynx is clear and moist.  Eyes: Conjunctivae and  EOM are normal. Pupils are equal, round, and reactive to light. Right eye exhibits no discharge. Left eye exhibits no discharge. No scleral icterus.  Neck: Normal range of motion. Neck supple. No JVD present. No tracheal deviation present. No thyromegaly present.  Cardiovascular: Normal rate, regular rhythm, normal heart sounds and intact distal pulses.   Pulmonary/Chest: Effort normal and breath sounds normal. No respiratory distress. He has no wheezes. He has no rales. He exhibits no tenderness.  Abdominal: Soft. Bowel sounds are normal. He exhibits no distension and no mass. There is no tenderness. There is no rebound and no guarding.  Musculoskeletal: Normal range of motion. He exhibits no edema or tenderness.  Lymphadenopathy:    He has no cervical adenopathy.  Neurological: He is alert and oriented to person, place, and time. Gait normal.  Skin: Skin is warm and dry. No rash noted. No erythema. No pallor.  Psychiatric: Affect normal.  Nursing note and vitals reviewed.   LABORATORY DATA:. Hospital Outpatient Visit on 03/14/2015  Component Date Value Ref Range Status  . Sodium 03/14/2015 143  135 - 145 mmol/L Final  . Potassium 03/14/2015 3.8  3.5 - 5.1 mmol/L Final  . Chloride 03/14/2015 110  101 - 111 mmol/L Final  . CO2 03/14/2015 26  22 - 32 mmol/L Final  . Glucose, Bld 03/14/2015 107* 65 - 99 mg/dL Final  . BUN 03/14/2015 11  6 - 20 mg/dL Final  . Creatinine, Ser 03/14/2015 0.82  0.61 - 1.24 mg/dL Final  . Calcium 03/14/2015 8.6* 8.9 - 10.3 mg/dL Final  . Total Protein 03/14/2015 6.5  6.5 - 8.1 g/dL Final  . Albumin 03/14/2015 3.6  3.5 - 5.0 g/dL Final  . AST 03/14/2015 12* 15 - 41 U/L Final  . ALT 03/14/2015 12* 17 - 63 U/L Final  . Alkaline Phosphatase 03/14/2015 57  38 - 126 U/L Final  . Total Bilirubin 03/14/2015 0.5  0.3 - 1.2 mg/dL Final  . GFR calc non Af Amer 03/14/2015 >60  >60 mL/min Final  . GFR calc Af Amer 03/14/2015 >60  >60 mL/min Final   Comment: (NOTE) The  eGFR has been calculated using the CKD EPI equation. This calculation has not been validated in all clinical situations. eGFR's persistently <60 mL/min signify possible Chronic Kidney Disease.   . Anion gap 03/14/2015 7  5 - 15 Final  Appointment on 03/14/2015  Component Date Value Ref Range Status  . WBC 03/14/2015 3.7* 4.0 - 10.3 10e3/uL Final  . NEUT# 03/14/2015 1.7  1.5 - 6.5 10e3/uL Final  . HGB 03/14/2015 8.3* 13.0 - 17.1 g/dL  Final  . HCT 03/14/2015 27.0* 38.4 - 49.9 % Final  . Platelets 03/14/2015 266  140 - 400 10e3/uL Final  . MCV 03/14/2015 70.3* 79.3 - 98.0 fL Final  . MCH 03/14/2015 21.6* 27.2 - 33.4 pg Final  . MCHC 03/14/2015 30.7* 32.0 - 36.0 g/dL Final  . RBC 03/14/2015 3.84* 4.20 - 5.82 10e6/uL Final  . RDW 03/14/2015 22.6* 11.0 - 14.6 % Final  . lymph# 03/14/2015 1.4  0.9 - 3.3 10e3/uL Final  . MONO# 03/14/2015 0.6  0.1 - 0.9 10e3/uL Final  . Eosinophils Absolute 03/14/2015 0.0  0.0 - 0.5 10e3/uL Final  . Basophils Absolute 03/14/2015 0.1  0.0 - 0.1 10e3/uL Final  . NEUT% 03/14/2015 45.5  39.0 - 75.0 % Final  . LYMPH% 03/14/2015 36.1  14.0 - 49.0 % Final  . MONO% 03/14/2015 15.6* 0.0 - 14.0 % Final  . EOS% 03/14/2015 1.0  0.0 - 7.0 % Final  . BASO% 03/14/2015 1.8  0.0 - 2.0 % Final     RADIOGRAPHIC STUDIES: No results found.  ASSESSMENT/PLAN:    Gastric cancer Patient presents to the [facility] today to receive Taxol only portion of his Taxol/Cyramza chemotherapy regimen.  Patient states that he's been doing very well within the last week or so.  He states that all of his nausea, vomiting, and abdominal discomfort have completely resolved.  He denies any diarrhea or constipation.  He states that he is eating much better and his energy level has improved as well.  He denies any oral lesions.  He also denies any neuropathy issues.  He denies any recent fevers or chills.  Blood counts obtained reveal a WBC of 3.7, ANC 1.7, hemoglobin 8.3, platelet count  266.  Patient is afebrile today on exam.  Patient is experiencing some mild to moderate chemotherapy therapy-induced anemia; but denies any worsening issues with fatigue are shortness of breath with exertion.  Will continue to monitor closely.  Since patient has a fairly complicated chemotherapy regimen schedule- patient's chemotherapy/antibody regimen was written out for them.  Also, patient is requesting to take a break surrounding the new year holidays; since he has a large amount of family coming in to celebrate.  Schedule changes surrounding the holidays will include: Patient will return for labs, visit, and Taxol only on 03/14/2015.  Appointments will be canceled for 03/21/2015.  Patient will return on 03/28/2015 for labs, visit, and will be due for both Taxol and Cyramza antibody infusion.  Also, clarification of regimen: Patient receives Taxol every week for 4 weeks in a row; and then takes one week off.  Patient receives the Cyramza antibody infusion on an every two-week basis.  Confirmed with both patient and his wife that they had a better understanding of chemotherapy regimen and plan to schedule over the holidays.     Patient stated understanding of all instructions; and was in agreement with this plan of care. The patient knows to call the clinic with any problems, questions or concerns.   This was a shared visit with Dr. Benay Spice today.  Total time spent with patient was 25 minutes;  with greater than 75 percent of that time spent in face to face counseling regarding patient's symptoms,  and coordination of care and follow up.  Disclaimer:This dictation was prepared with Dragon/digital dictation along with [employer]. Any transcriptional errors that result from this process are unintentional.  Drue Second, NP 03/14/2015

## 2015-03-23 ENCOUNTER — Other Ambulatory Visit: Payer: Self-pay | Admitting: Oncology

## 2015-03-26 ENCOUNTER — Encounter: Payer: Self-pay | Admitting: *Deleted

## 2015-03-27 ENCOUNTER — Telehealth: Payer: Self-pay | Admitting: *Deleted

## 2015-03-27 DIAGNOSIS — C169 Malignant neoplasm of stomach, unspecified: Secondary | ICD-10-CM

## 2015-03-27 NOTE — Telephone Encounter (Signed)
Pt coming into see Selena Lesser, NP for Dr. Benay Spice. Lab called needing lab orders for tomorrow. Per OV 12/23 CBC, CMP pended.

## 2015-03-28 ENCOUNTER — Ambulatory Visit (HOSPITAL_BASED_OUTPATIENT_CLINIC_OR_DEPARTMENT_OTHER): Payer: Medicare Other | Admitting: Nurse Practitioner

## 2015-03-28 ENCOUNTER — Ambulatory Visit (HOSPITAL_BASED_OUTPATIENT_CLINIC_OR_DEPARTMENT_OTHER): Payer: Medicare Other

## 2015-03-28 ENCOUNTER — Telehealth: Payer: Self-pay | Admitting: Nurse Practitioner

## 2015-03-28 ENCOUNTER — Telehealth: Payer: Self-pay | Admitting: *Deleted

## 2015-03-28 ENCOUNTER — Encounter: Payer: Self-pay | Admitting: Nurse Practitioner

## 2015-03-28 VITALS — BP 130/91 | HR 74 | Temp 98.3°F | Resp 18 | Ht 64.5 in | Wt 153.1 lb

## 2015-03-28 DIAGNOSIS — D6481 Anemia due to antineoplastic chemotherapy: Secondary | ICD-10-CM | POA: Diagnosis not present

## 2015-03-28 DIAGNOSIS — Z5112 Encounter for antineoplastic immunotherapy: Secondary | ICD-10-CM

## 2015-03-28 DIAGNOSIS — C169 Malignant neoplasm of stomach, unspecified: Secondary | ICD-10-CM

## 2015-03-28 DIAGNOSIS — Z5111 Encounter for antineoplastic chemotherapy: Secondary | ICD-10-CM | POA: Diagnosis not present

## 2015-03-28 DIAGNOSIS — C787 Secondary malignant neoplasm of liver and intrahepatic bile duct: Secondary | ICD-10-CM

## 2015-03-28 DIAGNOSIS — R21 Rash and other nonspecific skin eruption: Secondary | ICD-10-CM | POA: Insufficient documentation

## 2015-03-28 DIAGNOSIS — T451X5A Adverse effect of antineoplastic and immunosuppressive drugs, initial encounter: Secondary | ICD-10-CM

## 2015-03-28 LAB — COMPREHENSIVE METABOLIC PANEL
ALBUMIN: 3.6 g/dL (ref 3.5–5.0)
ALK PHOS: 60 U/L (ref 40–150)
ALT: <9 U/L (ref 0–55)
ANION GAP: 7 meq/L (ref 3–11)
AST: 10 U/L (ref 5–34)
BUN: 10.1 mg/dL (ref 7.0–26.0)
CALCIUM: 8.5 mg/dL (ref 8.4–10.4)
CHLORIDE: 111 meq/L — AB (ref 98–109)
CO2: 24 mEq/L (ref 22–29)
Creatinine: 0.8 mg/dL (ref 0.7–1.3)
Glucose: 77 mg/dl (ref 70–140)
POTASSIUM: 3.6 meq/L (ref 3.5–5.1)
Sodium: 142 mEq/L (ref 136–145)
Total Bilirubin: 0.38 mg/dL (ref 0.20–1.20)
Total Protein: 6.4 g/dL (ref 6.4–8.3)

## 2015-03-28 LAB — CBC WITH DIFFERENTIAL/PLATELET
BASO%: 1 % (ref 0.0–2.0)
BASOS ABS: 0 10*3/uL (ref 0.0–0.1)
EOS ABS: 0.2 10*3/uL (ref 0.0–0.5)
EOS%: 4.1 % (ref 0.0–7.0)
HEMATOCRIT: 27.3 % — AB (ref 38.4–49.9)
HEMOGLOBIN: 8.2 g/dL — AB (ref 13.0–17.1)
LYMPH#: 1.1 10*3/uL (ref 0.9–3.3)
LYMPH%: 25.9 % (ref 14.0–49.0)
MCH: 21.1 pg — AB (ref 27.2–33.4)
MCHC: 30.2 g/dL — AB (ref 32.0–36.0)
MCV: 69.8 fL — AB (ref 79.3–98.0)
MONO#: 0.5 10*3/uL (ref 0.1–0.9)
MONO%: 12.1 % (ref 0.0–14.0)
NEUT#: 2.5 10*3/uL (ref 1.5–6.5)
NEUT%: 56.9 % (ref 39.0–75.0)
Platelets: 199 10*3/uL (ref 140–400)
RBC: 3.91 10*6/uL — ABNORMAL LOW (ref 4.20–5.82)
RDW: 22 % — ABNORMAL HIGH (ref 11.0–14.6)
WBC: 4.4 10*3/uL (ref 4.0–10.3)

## 2015-03-28 LAB — UA PROTEIN, DIPSTICK - CHCC

## 2015-03-28 MED ORDER — SODIUM CHLORIDE 0.9 % IJ SOLN
10.0000 mL | INTRAMUSCULAR | Status: DC | PRN
  Administered 2015-03-28: 10 mL
  Filled 2015-03-28: qty 10

## 2015-03-28 MED ORDER — SODIUM CHLORIDE 0.9 % IV SOLN
8.0000 mg/kg | Freq: Once | INTRAVENOUS | Status: AC
  Administered 2015-03-28: 500 mg via INTRAVENOUS
  Filled 2015-03-28: qty 50

## 2015-03-28 MED ORDER — FAMOTIDINE IN NACL 20-0.9 MG/50ML-% IV SOLN
20.0000 mg | Freq: Once | INTRAVENOUS | Status: AC
  Administered 2015-03-28: 20 mg via INTRAVENOUS

## 2015-03-28 MED ORDER — DIPHENHYDRAMINE HCL 50 MG/ML IJ SOLN
25.0000 mg | Freq: Once | INTRAMUSCULAR | Status: AC
  Administered 2015-03-28: 25 mg via INTRAVENOUS

## 2015-03-28 MED ORDER — PACLITAXEL CHEMO INJECTION 300 MG/50ML
80.0000 mg/m2 | Freq: Once | INTRAVENOUS | Status: AC
  Administered 2015-03-28: 138 mg via INTRAVENOUS
  Filled 2015-03-28: qty 23

## 2015-03-28 MED ORDER — DIPHENHYDRAMINE HCL 50 MG/ML IJ SOLN
INTRAMUSCULAR | Status: AC
  Filled 2015-03-28: qty 1

## 2015-03-28 MED ORDER — HEPARIN SOD (PORK) LOCK FLUSH 100 UNIT/ML IV SOLN
500.0000 [IU] | Freq: Once | INTRAVENOUS | Status: AC | PRN
  Administered 2015-03-28: 500 [IU]
  Filled 2015-03-28: qty 5

## 2015-03-28 MED ORDER — SODIUM CHLORIDE 0.9 % IV SOLN: Freq: Once | INTRAVENOUS | Status: DC

## 2015-03-28 MED ORDER — SODIUM CHLORIDE 0.9 % IV SOLN
Freq: Once | INTRAVENOUS | Status: AC
  Administered 2015-03-28: 10:00:00 via INTRAVENOUS

## 2015-03-28 MED ORDER — SODIUM CHLORIDE 0.9 % IV SOLN
Freq: Once | INTRAVENOUS | Status: AC
  Administered 2015-03-28: 13:00:00 via INTRAVENOUS
  Filled 2015-03-28: qty 4

## 2015-03-28 MED ORDER — ACETAMINOPHEN 325 MG PO TABS
650.0000 mg | ORAL_TABLET | Freq: Once | ORAL | Status: AC
  Administered 2015-03-28: 650 mg via ORAL

## 2015-03-28 MED ORDER — ACETAMINOPHEN 325 MG PO TABS
ORAL_TABLET | ORAL | Status: AC
  Filled 2015-03-28: qty 2

## 2015-03-28 MED ORDER — FAMOTIDINE IN NACL 20-0.9 MG/50ML-% IV SOLN
INTRAVENOUS | Status: AC
  Filled 2015-03-28: qty 50

## 2015-03-28 NOTE — Patient Instructions (Signed)
[facility] Discharge Instructions for Patients Receiving Chemotherapy  Today you received the following chemotherapy agents: Taxol/ Cyramza  To help prevent nausea and vomiting after your treatment, we encourage you to take your nausea medication: Compazine. Take one every 6 hours as needed.  If you develop nausea and vomiting that is not controlled by your nausea medication, call the clinic.   BELOW ARE SYMPTOMS THAT SHOULD BE REPORTED IMMEDIATELY:  *FEVER GREATER THAN 100.5 F  *CHILLS WITH OR WITHOUT FEVER  NAUSEA AND VOMITING THAT IS NOT CONTROLLED WITH YOUR NAUSEA MEDICATION  *UNUSUAL SHORTNESS OF BREATH  *UNUSUAL BRUISING OR BLEEDING  TENDERNESS IN MOUTH AND THROAT WITH OR WITHOUT PRESENCE OF ULCERS  *URINARY PROBLEMS  *BOWEL PROBLEMS  UNUSUAL RASH Items with * indicate a potential emergency and should be followed up as soon as possible.  Feel free to call the clinic should you have any questions or concerns. The clinic phone number is [phone].  Please show the [facility] at check-in to the Emergency Department and triage nurse.

## 2015-03-28 NOTE — Assessment & Plan Note (Addendum)
Patient presents to the [facility] today to receive cycle 6 of his Taxol/Cyramza chemotherapy regimen.  Patient received his next week off of his chemotherapy regimen to spend time with his family over the holidays.  Patient states that he's been doing very well within the last week or so.  He states that all of his nausea, vomiting, and abdominal discomfort have completely resolved.  He denies any diarrhea or constipation.  He states that he is eating much better and his energy level has improved as well.  He denies any oral lesions.  He also denies any neuropathy issues.  He denies any recent fevers or chills.  Blood counts obtained reveal a WBC of 4.4, ANC 2.5, hemoglobin 8.2, platelet count 199.  Patient is afebrile today on exam.  Patient is experiencing some mild to moderate chemotherapy therapy-induced anemia; but denies any worsening issues with fatigue are shortness of breath with exertion.  Will continue to monitor closely.  Also, clarification of regimen: Patient receives Taxol every week for 4 weeks in a row; and then takes one week off.  Patient receives the Cyramza antibody infusion on an every two-week basis.  Patient will need to be scheduled for labs and the Taxol only portion of his chemotherapy on 04/04/2015.  Patient will return on 04/11/2015 for labs, flush, visit, and chemotherapy which will include both Taxol and Cyramza.

## 2015-03-28 NOTE — Telephone Encounter (Signed)
per pof to sch pt appt-cld & spoke to pt and gave pt time & date of appt °

## 2015-03-28 NOTE — Assessment & Plan Note (Signed)
Patient has a history of chronic chemotherapy therapy-induced anemia.  Hemoglobin today was 8.2.  However, patient denies any worsening issues with either fatigue or shortness of breath with exertion.  Will continue to monitor closely.

## 2015-03-28 NOTE — Telephone Encounter (Signed)
Per staff message and POF I have scheduled appts. Advised scheduler of appts. JMW  

## 2015-03-28 NOTE — Progress Notes (Signed)
SYMPTOM MANAGEMENT CLINIC   HPI: Joseph Salinas 55 y.o. male diagnosed with gastric cancer; with liver metastasis.  Currently undergoing Taxol/Cyramza chemotherapy regimen.  Patient presents to the [facility] today to receive cycle 6 of his Taxol/Cyramza chemotherapy regimen.  Patient received his next week off of his chemotherapy regimen to spend time with his family over the holidays.  Patient states that he's been doing very well within the last week or so.  He states that all of his nausea, vomiting, and abdominal discomfort have completely resolved.  He denies any diarrhea or constipation.  He states that he is eating much better and his energy level has improved as well.  He denies any oral lesions.  He also denies any neuropathy issues.  He denies any recent fevers or chills.  Blood counts obtained reveal a WBC of 4.4, ANC 2.5, hemoglobin 8.2, platelet count 199.  Patient is afebrile today on exam.  Patient is experiencing some mild to moderate chemotherapy therapy-induced anemia; but denies any worsening issues with fatigue are shortness of breath with exertion.  Will continue to monitor closely.  Also, clarification of regimen: Patient receives Taxol every week for 4 weeks in a row; and then takes one week off.  Patient receives the Cyramza antibody infusion on an every two-week basis.  Patient will need to be scheduled for labs and the Taxol only portion of his chemotherapy on 04/04/2015.  Patient will return on 04/11/2015 for labs, flush, visit, and chemotherapy which will include both Taxol and Cyramza.   HPI  ROS  Past Medical History  Diagnosis Date  . Colon polyp   . Family history of colon cancer   . Allergy   . Gastric cancer ([place]) 08/09/2012    Past Surgical History  Procedure Laterality Date  . No prior surgery    . Colonoscopy    . Insertion of port a cath Right 08/08/2012  . Esophagogastroduodenoscopy endoscopy    . Biospy  07/31/12    gastric   .  Multiple extractions with alveoloplasty N/A 11/10/2012    Procedure: extraction of tooth #'s 1, 2,10,14,15,16,17,18, 23, 24, 25, 26, 32 with alveoloplasty and gross debridement of remaining teeth;  Surgeon: Lenn Cal, DDS;  Location: [facility];  Service: Oral Surgery;  Laterality: N/A;    has Hx of adenomatous colonic polyps; Gastric cancer ([place]); Chronic periodontitis; Family history of colon cancer; Genetic testing; Nausea with vomiting; Insomnia; Vitamin D deficiency; Rash; and Antineoplastic chemotherapy induced anemia on his problem list.    is allergic to penicillins.    Medication List       This list is accurate as of: 03/28/15  6:29 PM.  Always use your most recent med list.               docusate sodium 50 MG capsule  Commonly known as:  COLACE  Take 1 capsule (50 mg total) by mouth 2 (two) times daily.     HYDROcodone-acetaminophen 5-325 MG tablet  Commonly known as:  NORCO  Take 1-2 tablets by mouth every 6 (six) hours as needed for moderate pain.     lidocaine-prilocaine cream  Commonly known as:  EMLA  Apply 1 application topically as needed. Apply 1 tsp to PAC site 1-2 hours prior to stick and cover with plastic wrap to numb site     ondansetron 8 MG tablet  Commonly known as:  ZOFRAN  Take 1 tablet (8 mg total) by mouth every 8 (eight) hours  as needed for nausea or vomiting.     pantoprazole 40 MG tablet  Commonly known as:  PROTONIX  Take 1 tablet (40 mg total) by mouth 2 (two) times daily.     prochlorperazine 10 MG tablet  Commonly known as:  COMPAZINE  Take 1 tablet (10 mg total) by mouth every 6 (six) hours as needed (nausea).     zolpidem 10 MG tablet  Commonly known as:  AMBIEN  Take 1 tablet (10 mg total) by mouth at bedtime as needed for sleep.         PHYSICAL EXAMINATION  Oncology Vitals 03/28/2015 03/14/2015  Height 164 cm 164 cm  Weight 69.446 kg 68.675 kg  Weight (lbs) 153 lbs 2 oz 151 lbs 6 oz  BMI (kg/m2) 25.87 kg/m2 25.59 kg/m2    Temp 98.3 97.9  Pulse 74 77  Resp 18 18  SpO2 100 100  BSA (m2) 1.78 m2 1.77 m2   BP Readings from Last 2 Encounters:  03/28/15 130/91  03/14/15 138/78    Physical Exam  Constitutional: He is oriented to person, place, and time and well-developed, well-nourished, and in no distress.  HENT:  Head: Normocephalic and atraumatic.  Mouth/Throat: Oropharynx is clear and moist.  Eyes: Conjunctivae and EOM are normal. Pupils are equal, round, and reactive to light. Right eye exhibits no discharge. Left eye exhibits no discharge. No scleral icterus.  Neck: Normal range of motion. Neck supple. No JVD present. No tracheal deviation present. No thyromegaly present.  Cardiovascular: Normal rate, regular rhythm, normal heart sounds and intact distal pulses.   Pulmonary/Chest: Effort normal and breath sounds normal. No respiratory distress. He has no wheezes. He has no rales. He exhibits no tenderness.  Abdominal: Soft. Bowel sounds are normal. He exhibits no distension and no mass. There is no tenderness. There is no rebound and no guarding.  Musculoskeletal: Normal range of motion. He exhibits no edema or tenderness.  Lymphadenopathy:    He has no cervical adenopathy.  Neurological: He is alert and oriented to person, place, and time. Gait normal.  Skin: Skin is warm and dry. Rash noted. No erythema. No pallor.  Patient has 1 resolving lesion to his right upper back only.  There is no evidence of infection to the site.  Psychiatric: Affect normal.  Nursing note and vitals reviewed.   LABORATORY DATA:. Clinical Support on 03/28/2015  Component Date Value Ref Range Status  . WBC 03/28/2015 4.4  4.0 - 10.3 10e3/uL Final  . NEUT# 03/28/2015 2.5  1.5 - 6.5 10e3/uL Final  . HGB 03/28/2015 8.2* 13.0 - 17.1 g/dL Final  . HCT 03/28/2015 27.3* 38.4 - 49.9 % Final  . Platelets 03/28/2015 199  140 - 400 10e3/uL Final  . MCV 03/28/2015 69.8* 79.3 - 98.0 fL Final  . MCH 03/28/2015 21.1* 27.2 - 33.4 pg  Final  . MCHC 03/28/2015 30.2* 32.0 - 36.0 g/dL Final  . RBC 03/28/2015 3.91* 4.20 - 5.82 10e6/uL Final  . RDW 03/28/2015 22.0* 11.0 - 14.6 % Final  . lymph# 03/28/2015 1.1  0.9 - 3.3 10e3/uL Final  . MONO# 03/28/2015 0.5  0.1 - 0.9 10e3/uL Final  . Eosinophils Absolute 03/28/2015 0.2  0.0 - 0.5 10e3/uL Final  . Basophils Absolute 03/28/2015 0.0  0.0 - 0.1 10e3/uL Final  . NEUT% 03/28/2015 56.9  39.0 - 75.0 % Final  . LYMPH% 03/28/2015 25.9  14.0 - 49.0 % Final  . MONO% 03/28/2015 12.1  0.0 - 14.0 % Final  .  EOS% 03/28/2015 4.1  0.0 - 7.0 % Final  . BASO% 03/28/2015 1.0  0.0 - 2.0 % Final  . Sodium 03/28/2015 142  136 - 145 mEq/L Final  . Potassium 03/28/2015 3.6  3.5 - 5.1 mEq/L Final  . Chloride 03/28/2015 111* 98 - 109 mEq/L Final  . CO2 03/28/2015 24  22 - 29 mEq/L Final  . Glucose 03/28/2015 77  70 - 140 mg/dl Final   Glucose reference range is for nonfasting patients. Fasting glucose reference range is 70- 100.  [place] Kitchen BUN 03/28/2015 10.1  7.0 - 26.0 mg/dL Final  . Creatinine 03/28/2015 0.8  0.7 - 1.3 mg/dL Final  . Total Bilirubin 03/28/2015 0.38  0.20 - 1.20 mg/dL Final  . Alkaline Phosphatase 03/28/2015 60  40 - 150 U/L Final  . AST 03/28/2015 10  5 - 34 U/L Final  . ALT 03/28/2015 <9  0 - 55 U/L Final  . Total Protein 03/28/2015 6.4  6.4 - 8.3 g/dL Final  . Albumin 03/28/2015 3.6  3.5 - 5.0 g/dL Final  . Calcium 03/28/2015 8.5  8.4 - 10.4 mg/dL Final  . Anion Gap 03/28/2015 7  3 - 11 mEq/L Final  . EGFR 03/28/2015 >90  >90 ml/min/1.73 m2 Final   eGFR is calculated using the CKD-EPI Creatinine Equation (2009)  . Protein, ur 03/28/2015 < 30  Negative- <30 mg/dL Final     RADIOGRAPHIC STUDIES: No results found.  ASSESSMENT/PLAN:    Gastric cancer Patient presents to the [facility] today to receive cycle 6 of his Taxol/Cyramza chemotherapy regimen.  Patient received his next week off of his chemotherapy regimen to spend time with his family over the holidays.  Patient  states that he's been doing very well within the last week or so.  He states that all of his nausea, vomiting, and abdominal discomfort have completely resolved.  He denies any diarrhea or constipation.  He states that he is eating much better and his energy level has improved as well.  He denies any oral lesions.  He also denies any neuropathy issues.  He denies any recent fevers or chills.  Blood counts obtained reveal a WBC of 4.4, ANC 2.5, hemoglobin 8.2, platelet count 199.  Patient is afebrile today on exam.  Patient is experiencing some mild to moderate chemotherapy therapy-induced anemia; but denies any worsening issues with fatigue are shortness of breath with exertion.  Will continue to monitor closely.  Also, clarification of regimen: Patient receives Taxol every week for 4 weeks in a row; and then takes one week off.  Patient receives the Cyramza antibody infusion on an every two-week basis.  Patient will need to be scheduled for labs and the Taxol only portion of his chemotherapy on 04/04/2015.  Patient will return on 04/11/2015 for labs, flush, visit, and chemotherapy which will include both Taxol and Cyramza.        Rash Patient has a history of chronic rash in the past with his chemotherapy regimen; but his rash had essentially resolved.  Patient has noted only one lesion to his right upper back that is only occasionally pruritic.  On exam.-Patient has 1 resolving lesion to his right upper back with no evidence of active infection.  Advised patient to continue to monitor closely.  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  Hemoglobin today was 8.2.  However, patient denies any worsening issues with either fatigue or shortness of breath with exertion.  Will continue to  monitor closely.   Patient stated understanding of all instructions; and was in agreement with this plan of care. The patient knows to call  the clinic with any problems, questions or concerns.   Reviewed all details of today's visit with Dr. Benay Spice.    Total time spent with patient was 25 minutes;  with greater than 75 percent of that time spent in face to face counseling regarding patient's symptoms,  and coordination of care and follow up.  Disclaimer:This dictation was prepared with Dragon/digital dictation along with [employer]. Any transcriptional errors that result from this process are unintentional.  Drue Second, NP 03/28/2015

## 2015-03-28 NOTE — Assessment & Plan Note (Signed)
Patient has a history of chronic rash in the past with his chemotherapy regimen; but his rash had essentially resolved.  Patient has noted only one lesion to his right upper back that is only occasionally pruritic.  On exam.-Patient has 1 resolving lesion to his right upper back with no evidence of active infection.  Advised patient to continue to monitor closely.

## 2015-03-28 NOTE — Telephone Encounter (Signed)
per pof tos ch pt appt-sent MW email to sch trmt-will call pt after reply °

## 2015-03-30 ENCOUNTER — Other Ambulatory Visit: Payer: Self-pay | Admitting: Oncology

## 2015-04-03 ENCOUNTER — Other Ambulatory Visit: Payer: Self-pay | Admitting: *Deleted

## 2015-04-03 DIAGNOSIS — C169 Malignant neoplasm of stomach, unspecified: Secondary | ICD-10-CM

## 2015-04-04 ENCOUNTER — Other Ambulatory Visit (HOSPITAL_BASED_OUTPATIENT_CLINIC_OR_DEPARTMENT_OTHER): Payer: Medicare Other

## 2015-04-04 ENCOUNTER — Ambulatory Visit (HOSPITAL_BASED_OUTPATIENT_CLINIC_OR_DEPARTMENT_OTHER): Payer: Medicare Other

## 2015-04-04 VITALS — BP 146/86 | HR 71 | Temp 97.8°F | Resp 18

## 2015-04-04 DIAGNOSIS — C169 Malignant neoplasm of stomach, unspecified: Secondary | ICD-10-CM

## 2015-04-04 DIAGNOSIS — Z5111 Encounter for antineoplastic chemotherapy: Secondary | ICD-10-CM | POA: Diagnosis not present

## 2015-04-04 LAB — COMPREHENSIVE METABOLIC PANEL
ALT: 9 U/L (ref 0–55)
ANION GAP: 7 meq/L (ref 3–11)
AST: 11 U/L (ref 5–34)
Albumin: 3.6 g/dL (ref 3.5–5.0)
Alkaline Phosphatase: 58 U/L (ref 40–150)
BUN: 10 mg/dL (ref 7.0–26.0)
CO2: 23 meq/L (ref 22–29)
Calcium: 8.2 mg/dL — ABNORMAL LOW (ref 8.4–10.4)
Chloride: 112 mEq/L — ABNORMAL HIGH (ref 98–109)
Creatinine: 0.8 mg/dL (ref 0.7–1.3)
GLUCOSE: 90 mg/dL (ref 70–140)
POTASSIUM: 4.3 meq/L (ref 3.5–5.1)
SODIUM: 143 meq/L (ref 136–145)
TOTAL PROTEIN: 6.4 g/dL (ref 6.4–8.3)
Total Bilirubin: 0.35 mg/dL (ref 0.20–1.20)

## 2015-04-04 LAB — CBC WITH DIFFERENTIAL/PLATELET
BASO%: 2.9 % — ABNORMAL HIGH (ref 0.0–2.0)
Basophils Absolute: 0.1 10*3/uL (ref 0.0–0.1)
EOS ABS: 0.2 10*3/uL (ref 0.0–0.5)
EOS%: 4 % (ref 0.0–7.0)
HCT: 25.5 % — ABNORMAL LOW (ref 38.4–49.9)
HEMOGLOBIN: 7.8 g/dL — AB (ref 13.0–17.1)
LYMPH%: 32.1 % (ref 14.0–49.0)
MCH: 21.4 pg — ABNORMAL LOW (ref 27.2–33.4)
MCHC: 30.5 g/dL — ABNORMAL LOW (ref 32.0–36.0)
MCV: 69.9 fL — AB (ref 79.3–98.0)
MONO#: 0.3 10*3/uL (ref 0.1–0.9)
MONO%: 6.3 % (ref 0.0–14.0)
NEUT%: 54.7 % (ref 39.0–75.0)
NEUTROS ABS: 2.2 10*3/uL (ref 1.5–6.5)
PLATELETS: 218 10*3/uL (ref 140–400)
RBC: 3.65 10*6/uL — AB (ref 4.20–5.82)
RDW: 22.1 % — ABNORMAL HIGH (ref 11.0–14.6)
WBC: 4 10*3/uL (ref 4.0–10.3)
lymph#: 1.3 10*3/uL (ref 0.9–3.3)

## 2015-04-04 MED ORDER — FAMOTIDINE IN NACL 20-0.9 MG/50ML-% IV SOLN
INTRAVENOUS | Status: AC
Start: 2015-04-04 — End: 2015-04-04
  Filled 2015-04-04: qty 50

## 2015-04-04 MED ORDER — SODIUM CHLORIDE 0.9 % IV SOLN
Freq: Once | INTRAVENOUS | Status: AC
  Administered 2015-04-04: 11:00:00 via INTRAVENOUS

## 2015-04-04 MED ORDER — SODIUM CHLORIDE 0.9 % IV SOLN
Freq: Once | INTRAVENOUS | Status: AC
  Administered 2015-04-04: 12:00:00 via INTRAVENOUS
  Filled 2015-04-04: qty 4

## 2015-04-04 MED ORDER — SODIUM CHLORIDE 0.9 % IJ SOLN
10.0000 mL | INTRAMUSCULAR | Status: DC | PRN
  Administered 2015-04-04: 10 mL
  Filled 2015-04-04: qty 10

## 2015-04-04 MED ORDER — FAMOTIDINE IN NACL 20-0.9 MG/50ML-% IV SOLN
20.0000 mg | Freq: Once | INTRAVENOUS | Status: AC
  Administered 2015-04-04: 20 mg via INTRAVENOUS

## 2015-04-04 MED ORDER — PACLITAXEL CHEMO INJECTION 300 MG/50ML
80.0000 mg/m2 | Freq: Once | INTRAVENOUS | Status: AC
  Administered 2015-04-04: 138 mg via INTRAVENOUS
  Filled 2015-04-04: qty 23

## 2015-04-04 MED ORDER — DIPHENHYDRAMINE HCL 50 MG/ML IJ SOLN
INTRAMUSCULAR | Status: AC
  Filled 2015-04-04: qty 1

## 2015-04-04 MED ORDER — DIPHENHYDRAMINE HCL 50 MG/ML IJ SOLN
25.0000 mg | Freq: Once | INTRAMUSCULAR | Status: AC
  Administered 2015-04-04: 25 mg via INTRAVENOUS

## 2015-04-04 MED ORDER — HEPARIN SOD (PORK) LOCK FLUSH 100 UNIT/ML IV SOLN
500.0000 [IU] | Freq: Once | INTRAVENOUS | Status: AC | PRN
  Administered 2015-04-04: 500 [IU]
  Filled 2015-04-04: qty 5

## 2015-04-04 NOTE — Progress Notes (Signed)
Per Dr. Benay Spice; Faythe Ghee to treat with hb 7.8

## 2015-04-06 ENCOUNTER — Other Ambulatory Visit: Payer: Self-pay | Admitting: Oncology

## 2015-04-10 ENCOUNTER — Other Ambulatory Visit: Payer: Self-pay | Admitting: *Deleted

## 2015-04-10 DIAGNOSIS — C169 Malignant neoplasm of stomach, unspecified: Secondary | ICD-10-CM

## 2015-04-11 ENCOUNTER — Ambulatory Visit (HOSPITAL_BASED_OUTPATIENT_CLINIC_OR_DEPARTMENT_OTHER): Payer: Medicare Other | Admitting: Nurse Practitioner

## 2015-04-11 ENCOUNTER — Other Ambulatory Visit (HOSPITAL_BASED_OUTPATIENT_CLINIC_OR_DEPARTMENT_OTHER): Payer: Medicare Other

## 2015-04-11 ENCOUNTER — Telehealth: Payer: Self-pay | Admitting: Oncology

## 2015-04-11 ENCOUNTER — Ambulatory Visit (HOSPITAL_BASED_OUTPATIENT_CLINIC_OR_DEPARTMENT_OTHER): Payer: Medicare Other

## 2015-04-11 VITALS — BP 145/80 | HR 77 | Temp 98.3°F | Resp 17 | Ht 64.5 in | Wt 151.2 lb

## 2015-04-11 DIAGNOSIS — C169 Malignant neoplasm of stomach, unspecified: Secondary | ICD-10-CM

## 2015-04-11 DIAGNOSIS — Z5111 Encounter for antineoplastic chemotherapy: Secondary | ICD-10-CM

## 2015-04-11 DIAGNOSIS — C162 Malignant neoplasm of body of stomach: Secondary | ICD-10-CM

## 2015-04-11 DIAGNOSIS — D701 Agranulocytosis secondary to cancer chemotherapy: Secondary | ICD-10-CM

## 2015-04-11 DIAGNOSIS — Z5112 Encounter for antineoplastic immunotherapy: Secondary | ICD-10-CM

## 2015-04-11 LAB — COMPREHENSIVE METABOLIC PANEL
ALBUMIN: 3.6 g/dL (ref 3.5–5.0)
ANION GAP: 7 meq/L (ref 3–11)
AST: 10 U/L (ref 5–34)
Alkaline Phosphatase: 57 U/L (ref 40–150)
BILIRUBIN TOTAL: 0.38 mg/dL (ref 0.20–1.20)
BUN: 13.3 mg/dL (ref 7.0–26.0)
CALCIUM: 8.9 mg/dL (ref 8.4–10.4)
CO2: 26 mEq/L (ref 22–29)
CREATININE: 0.8 mg/dL (ref 0.7–1.3)
Chloride: 109 mEq/L (ref 98–109)
EGFR: >90 mL/min/{1.73_m2} (ref >90)
Glucose: 96 mg/dl (ref 70–140)
Potassium: 3.9 mEq/L (ref 3.5–5.1)
Sodium: 141 mEq/L (ref 136–145)
TOTAL PROTEIN: 6.6 g/dL (ref 6.4–8.3)

## 2015-04-11 LAB — CBC WITH DIFFERENTIAL/PLATELET
BASO%: 2.1 % — AB (ref 0.0–2.0)
Basophils Absolute: 0.1 10*3/uL (ref 0.0–0.1)
EOS%: 1.9 % (ref 0.0–7.0)
Eosinophils Absolute: 0.1 10*3/uL (ref 0.0–0.5)
HEMATOCRIT: 25.7 % — AB (ref 38.4–49.9)
HEMOGLOBIN: 7.8 g/dL — AB (ref 13.0–17.1)
LYMPH#: 1.3 10*3/uL (ref 0.9–3.3)
LYMPH%: 39.3 % (ref 14.0–49.0)
MCH: 21.5 pg — ABNORMAL LOW (ref 27.2–33.4)
MCHC: 30.3 g/dL — ABNORMAL LOW (ref 32.0–36.0)
MCV: 71.1 fL — ABNORMAL LOW (ref 79.3–98.0)
MONO#: 0.2 10*3/uL (ref 0.1–0.9)
MONO%: 6.5 % (ref 0.0–14.0)
NEUT%: 50.2 % (ref 39.0–75.0)
NEUTROS ABS: 1.7 10*3/uL (ref 1.5–6.5)
PLATELETS: 242 10*3/uL (ref 140–400)
RBC: 3.62 10*6/uL — ABNORMAL LOW (ref 4.20–5.82)
RDW: 23.7 % — ABNORMAL HIGH (ref 11.0–14.6)
WBC: 3.3 10*3/uL — AB (ref 4.0–10.3)

## 2015-04-11 MED ORDER — SODIUM CHLORIDE 0.9 % IV SOLN
Freq: Once | INTRAVENOUS | Status: AC
  Administered 2015-04-11: 12:00:00 via INTRAVENOUS

## 2015-04-11 MED ORDER — FAMOTIDINE IN NACL 20-0.9 MG/50ML-% IV SOLN
INTRAVENOUS | Status: AC
  Filled 2015-04-11: qty 50

## 2015-04-11 MED ORDER — ACETAMINOPHEN 325 MG PO TABS
ORAL_TABLET | ORAL | Status: AC
  Filled 2015-04-11: qty 2

## 2015-04-11 MED ORDER — ACETAMINOPHEN 325 MG PO TABS
650.0000 mg | ORAL_TABLET | Freq: Once | ORAL | Status: AC
  Administered 2015-04-11: 650 mg via ORAL

## 2015-04-11 MED ORDER — RAMUCIRUMAB CHEMO INJECTION 500 MG/50ML
8.0000 mg/kg | Freq: Once | INTRAVENOUS | Status: AC
  Administered 2015-04-11: 500 mg via INTRAVENOUS
  Filled 2015-04-11: qty 50

## 2015-04-11 MED ORDER — PACLITAXEL CHEMO INJECTION 300 MG/50ML
80.0000 mg/m2 | Freq: Once | INTRAVENOUS | Status: AC
  Administered 2015-04-11: 138 mg via INTRAVENOUS
  Filled 2015-04-11: qty 23

## 2015-04-11 MED ORDER — SODIUM CHLORIDE 0.9 % IJ SOLN
10.0000 mL | INTRAMUSCULAR | Status: DC | PRN
  Administered 2015-04-11: 10 mL
  Filled 2015-04-11: qty 10

## 2015-04-11 MED ORDER — SODIUM CHLORIDE 0.9 % IV SOLN
Freq: Once | INTRAVENOUS | Status: AC
  Administered 2015-04-11: 13:00:00 via INTRAVENOUS
  Filled 2015-04-11: qty 4

## 2015-04-11 MED ORDER — HEPARIN SOD (PORK) LOCK FLUSH 100 UNIT/ML IV SOLN
500.0000 [IU] | Freq: Once | INTRAVENOUS | Status: AC | PRN
  Administered 2015-04-11: 500 [IU]
  Filled 2015-04-11: qty 5

## 2015-04-11 MED ORDER — FAMOTIDINE IN NACL 20-0.9 MG/50ML-% IV SOLN
20.0000 mg | Freq: Once | INTRAVENOUS | Status: AC
  Administered 2015-04-11: 20 mg via INTRAVENOUS

## 2015-04-11 MED ORDER — DIPHENHYDRAMINE HCL 50 MG/ML IJ SOLN
INTRAMUSCULAR | Status: AC
  Filled 2015-04-11: qty 1

## 2015-04-11 MED ORDER — DIPHENHYDRAMINE HCL 50 MG/ML IJ SOLN
25.0000 mg | Freq: Once | INTRAMUSCULAR | Status: AC
  Administered 2015-04-11: 25 mg via INTRAVENOUS

## 2015-04-11 NOTE — Patient Instructions (Signed)
[facility] Discharge Instructions for Patients Receiving Chemotherapy  Today you received the following chemotherapy agents: Taxol/ Cyramza  To help prevent nausea and vomiting after your treatment, we encourage you to take your nausea medication: Compazine. Take one every 6 hours as needed.  If you develop nausea and vomiting that is not controlled by your nausea medication, call the clinic.   BELOW ARE SYMPTOMS THAT SHOULD BE REPORTED IMMEDIATELY:  *FEVER GREATER THAN 100.5 F  *CHILLS WITH OR WITHOUT FEVER  NAUSEA AND VOMITING THAT IS NOT CONTROLLED WITH YOUR NAUSEA MEDICATION  *UNUSUAL SHORTNESS OF BREATH  *UNUSUAL BRUISING OR BLEEDING  TENDERNESS IN MOUTH AND THROAT WITH OR WITHOUT PRESENCE OF ULCERS  *URINARY PROBLEMS  *BOWEL PROBLEMS  UNUSUAL RASH Items with * indicate a potential emergency and should be followed up as soon as possible.  Feel free to call the clinic should you have any questions or concerns. The clinic phone number is [phone].  Please show the [place] at check-in to the Emergency Department and triage nurse.

## 2015-04-11 NOTE — Telephone Encounter (Signed)
Gave patient avs report and appointments for February.  °

## 2015-04-11 NOTE — Progress Notes (Signed)
[name]   Diagnosis:  Gastric cancer  INTERVAL HISTORY:   [name] returns as scheduled. He completed another treatment with weekly Taxol 04/04/2015. He denies nausea/vomiting. No mouth sores. No diarrhea. No numbness or tingling in his hands or feet. He denies dysphagia. He has a good appetite. He notes a small amount of blood with nose blowing. No shortness of breath or chest pain.  Objective:  Vital signs in last 24 hours:  Blood pressure 145/80, pulse 77, temperature 98.3 F (36.8 C), temperature source Oral, resp. rate 17, height 5' 4.5" (1.638 m), weight 151 lb 3.2 oz (68.584 kg), SpO2 100 %.    HEENT: No thrush or ulcers. Resp: Lungs clear bilaterally. Cardio: Regular rate and rhythm. GI: Abdomen soft and nontender. No hepatomegaly. Vascular: No leg edema. Neuro: Vibratory sense mildly decreased over the fingertips per tuning fork exam.  Port-A-Cath without erythema.    Lab Results:  Lab Results  Component Value Date   WBC 3.3* 04/11/2015   HGB 7.8* 04/11/2015   HCT 25.7* 04/11/2015   MCV 71.1* 04/11/2015   PLT 242 04/11/2015   NEUTROABS 1.7 04/11/2015    Imaging:  No results found.  Medications: I have reviewed the patient's current medications.  Assessment/Plan: 1.Metastatic gastric cancer-status post biopsy of a gastric mass on 07/20/2012 with the pathology confirming adenocarcinoma, HER-2 non-amplified.  - CT 07/18/2012 consistent with a primary gastric mass and metastatic omental/peritoneal nodules  -biopsy of a peritoneal nodule 07/31/2012 consistent with metastatic adenocarcinoma  -Cycle 1 of FOLFOX on 08/02/2012  -Restaging CT 10/23/2012 consistent with a partial response-decrease in the primary gastric tumor and omental/peritoneal implants  -cycle 10 of FOLFOX completed 01/11/2013  -normal CEA 01/11/2013  -Restaging CT 01/23/2013 with improvement in the peritoneal metastases and an increased size  of an indeterminate splenic lesion  -Initiation of maintenance Xeloda therapy on a 7 day on/7 day off schedule 01/29/2013.  -Xeloda dose reduced to 1000 mg twice daily 7 days on/7 days off beginning 02/26/2013 due to nausea and diarrhea.  -Xeloda dose reduced to 500 mg twice daily, 7 days/14 days off 03/20/2013.  -Xeloda discontinued 04/25/2013 due to progressive hand foot syndrome.  -CEA elevated at 9.1 on 04/25/2013.  -CEA stable at 9.5 on 05/09/2013.  -Initiation of infusional 5-FU as per the FOLFOX regimen 05/09/2013.  -CEA 7.4 on 05/24/2013.  -CEA 7.9 on 06/21/2013.  -CEA 9.3 on 08/22/2013 .  -CEA 11.1 on 10/01/2013.  -Restaging CT 10/01/2013 with persistent asymmetric gastric wall thickening in the greater curvature of the mid stomach. Dominant index gastrocolic lymph node decreased in size. Other smaller perigastric lymph nodes stable. No new or progressive findings in or around the stomach. Low-density subcapsular splenic lesion resolved.  -Continuation of infusional 5-fluorouracil, switched to a 4 week schedule beginning 01/02/2014 -CT 03/26/2014 with enlargement of the gastric mass and a possible new liver lesion -FOLFOX chemotherapy resumed 03/27/2014 -Restaging CT scan 06/03/2014 with mild decrease in volume of gastric wall mass. Decrease in volume of perigastric metastatic lymph node. Stable adrenal nodularity. No evidence of disease progression. -Cycle 6 FOLFOX 06/05/2014 - cycle 7 FOLFOX 06/19/2014 -Cycle 8 FOLFOX 07/03/2014 -Cycle 9 FOLFOX 07/17/2014 -Cycle 10 FOLFOX 07/31/2014 -Restaging CT 08/20/2014 revealed stable gastric wall thickening and a slight decrease in the size of a tumor nodule anterior to the stomach -Cycle 11 FOLFOX 08/28/2014 -Restaging CT evaluation 01/28/2015 with local progression of the primary gastric tumor along the greater curvature in the proximal body of the  stomach. Progressive peritoneal tumor implants adjacent to the splenic hilum and  in the gastrocolic ligament. New gastrohepatic ligament lymphadenopathy. New 2.9 cm right liver lobe mass. -Cycle 1 Taxol/cyramza beginning 02/07/2015 -Cycle 2 Taxol/cyramza beginning 03/14/2015  3. Multiple colon polyps documented on a colonoscopy 06/22/2012-tubular adenomas, tubulovillous adenomas, and hyperplastic polyps  4. Family history of colon cancer (mother). Status post genetics evaluation. Negative genetic testing on the Lynch/High Risk colon panel test  5. Pain secondary to the primary gastric mass or carcinomatosis -resolved  6. 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 Enrique Sack, multiple extractions were recommended  7. Anorexia, nausea, and early satiety-improved  8. 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 Nausea following chemotherapy-improved with the addition of prophylactic Decadron  10. Skin rash-most likely secondary to Decadron, improved  11. History of neutropenia secondary to chemotherapy  12. 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 Nausea and diarrhea while treated with Xeloda. Improved.  15. Hand-foot syndrome 03/07/2013 manifesting with erythema, dry desquamation and pain on the palms and erythema and pain on the soles. Progressive hand pain 04/25/2013. Xeloda discontinued.  16. Anemia-microcytic; chronic, stable. Ferritin low at 6 11/21/2013. He will resume iron therapy 17. Delayed nausea following cycle 1 FOLFOX. Aloxi added beginning with cycle 2 04/10/2014.  18. Rash and pruritus during the oxaliplatin infusion with cycle 3 FOLFOX, improved with Benadryl, Solu-Medrol, and Pepcid, no associated symptoms, no rash with cycle 4 FOLFOX 05/08/2014 19. Hives during the oxaliplatin infusion with cycle 11. Resolved with  Benadryl. Infusion was completed.     Disposition: Joseph Salinas appears well. He will complete cycle 2 Taxol/cyramza today. He will return for a follow-up visit and initiation of cycle 3 04/25/2015.  We will follow-up on the CEA from today. The plan is for restaging CT evaluation after completion of cycle 3.  Plan reviewed with Dr. Benay Spice.   Ned Card ANP/GNP-BC   04/11/2015  11:31 AM

## 2015-04-12 LAB — CEA (PARALLEL TESTING): CEA: 18 ng/mL — AB (ref 0.0–5.0)

## 2015-04-12 LAB — CEA: CEA: 19.1 ng/mL — ABNORMAL HIGH (ref 0.0–4.7)

## 2015-04-14 ENCOUNTER — Telehealth: Payer: Self-pay | Admitting: *Deleted

## 2015-04-14 NOTE — Telephone Encounter (Signed)
Per Dr. Benay Spice; notified pt's wife that cea is better.  Wife verbalized understanding and expressed appreciation for call.

## 2015-04-14 NOTE — Telephone Encounter (Signed)
-----   Message from Ladell Pier, MD sent at 04/14/2015  1:48 PM EST ----- Please call patient, Joseph Salinas is better

## 2015-04-19 ENCOUNTER — Other Ambulatory Visit: Payer: Self-pay | Admitting: Oncology

## 2015-04-25 ENCOUNTER — Other Ambulatory Visit (HOSPITAL_BASED_OUTPATIENT_CLINIC_OR_DEPARTMENT_OTHER): Payer: Medicare Other

## 2015-04-25 ENCOUNTER — Ambulatory Visit (HOSPITAL_BASED_OUTPATIENT_CLINIC_OR_DEPARTMENT_OTHER): Payer: Medicare Other

## 2015-04-25 ENCOUNTER — Ambulatory Visit (HOSPITAL_BASED_OUTPATIENT_CLINIC_OR_DEPARTMENT_OTHER): Payer: Medicare Other | Admitting: Nurse Practitioner

## 2015-04-25 VITALS — BP 144/70 | HR 82 | Temp 98.0°F | Resp 18 | Ht 64.5 in | Wt 155.0 lb

## 2015-04-25 DIAGNOSIS — C786 Secondary malignant neoplasm of retroperitoneum and peritoneum: Secondary | ICD-10-CM | POA: Diagnosis not present

## 2015-04-25 DIAGNOSIS — C162 Malignant neoplasm of body of stomach: Secondary | ICD-10-CM

## 2015-04-25 DIAGNOSIS — Z5112 Encounter for antineoplastic immunotherapy: Secondary | ICD-10-CM | POA: Diagnosis not present

## 2015-04-25 DIAGNOSIS — C169 Malignant neoplasm of stomach, unspecified: Secondary | ICD-10-CM

## 2015-04-25 DIAGNOSIS — Z5111 Encounter for antineoplastic chemotherapy: Secondary | ICD-10-CM

## 2015-04-25 DIAGNOSIS — D509 Iron deficiency anemia, unspecified: Secondary | ICD-10-CM | POA: Diagnosis not present

## 2015-04-25 LAB — CBC WITH DIFFERENTIAL/PLATELET
BASO%: 2.2 % — ABNORMAL HIGH (ref 0.0–2.0)
BASOS ABS: 0.1 10*3/uL (ref 0.0–0.1)
EOS%: 2.2 % (ref 0.0–7.0)
Eosinophils Absolute: 0.1 10*3/uL (ref 0.0–0.5)
HEMATOCRIT: 26.1 % — AB (ref 38.4–49.9)
HGB: 7.8 g/dL — ABNORMAL LOW (ref 13.0–17.1)
LYMPH%: 37 % (ref 14.0–49.0)
MCH: 21.5 pg — AB (ref 27.2–33.4)
MCHC: 30 g/dL — AB (ref 32.0–36.0)
MCV: 71.4 fL — ABNORMAL LOW (ref 79.3–98.0)
MONO#: 0.5 10*3/uL (ref 0.1–0.9)
MONO%: 14.5 % — ABNORMAL HIGH (ref 0.0–14.0)
NEUT#: 1.6 10*3/uL (ref 1.5–6.5)
NEUT%: 44.1 % (ref 39.0–75.0)
PLATELETS: 267 10*3/uL (ref 140–400)
RBC: 3.66 10*6/uL — ABNORMAL LOW (ref 4.20–5.82)
RDW: 25.9 % — ABNORMAL HIGH (ref 11.0–14.6)
WBC: 3.6 10*3/uL — ABNORMAL LOW (ref 4.0–10.3)
lymph#: 1.3 10*3/uL (ref 0.9–3.3)

## 2015-04-25 LAB — COMPREHENSIVE METABOLIC PANEL
ALK PHOS: 57 U/L (ref 40–150)
ANION GAP: 8 meq/L (ref 3–11)
AST: 11 U/L (ref 5–34)
Albumin: 3.5 g/dL (ref 3.5–5.0)
BUN: 9.3 mg/dL (ref 7.0–26.0)
CALCIUM: 8.2 mg/dL — AB (ref 8.4–10.4)
CHLORIDE: 110 meq/L — AB (ref 98–109)
CO2: 23 mEq/L (ref 22–29)
CREATININE: 0.9 mg/dL (ref 0.7–1.3)
EGFR: >90 mL/min/{1.73_m2} (ref >90)
Glucose: 128 mg/dl (ref 70–140)
Potassium: 4.1 mEq/L (ref 3.5–5.1)
Sodium: 141 mEq/L (ref 136–145)
Total Bilirubin: 0.39 mg/dL (ref 0.20–1.20)
Total Protein: 6.5 g/dL (ref 6.4–8.3)

## 2015-04-25 MED ORDER — FAMOTIDINE IN NACL 20-0.9 MG/50ML-% IV SOLN
20.0000 mg | Freq: Once | INTRAVENOUS | Status: AC
  Administered 2015-04-25: 20 mg via INTRAVENOUS

## 2015-04-25 MED ORDER — ACETAMINOPHEN 325 MG PO TABS
650.0000 mg | ORAL_TABLET | Freq: Once | ORAL | Status: AC
  Administered 2015-04-25: 650 mg via ORAL

## 2015-04-25 MED ORDER — FAMOTIDINE IN NACL 20-0.9 MG/50ML-% IV SOLN
INTRAVENOUS | Status: AC
  Filled 2015-04-25: qty 50

## 2015-04-25 MED ORDER — SODIUM CHLORIDE 0.9 % IV SOLN
8.0000 mg/kg | Freq: Once | INTRAVENOUS | Status: AC
  Administered 2015-04-25: 500 mg via INTRAVENOUS
  Filled 2015-04-25: qty 50

## 2015-04-25 MED ORDER — PACLITAXEL CHEMO INJECTION 300 MG/50ML
80.0000 mg/m2 | Freq: Once | INTRAVENOUS | Status: AC
  Administered 2015-04-25: 138 mg via INTRAVENOUS
  Filled 2015-04-25: qty 23

## 2015-04-25 MED ORDER — SODIUM CHLORIDE 0.9 % IV SOLN
Freq: Once | INTRAVENOUS | Status: AC
  Administered 2015-04-25: 11:00:00 via INTRAVENOUS
  Filled 2015-04-25: qty 4

## 2015-04-25 MED ORDER — ACETAMINOPHEN 325 MG PO TABS
ORAL_TABLET | ORAL | Status: AC
  Filled 2015-04-25: qty 2

## 2015-04-25 MED ORDER — HEPARIN SOD (PORK) LOCK FLUSH 100 UNIT/ML IV SOLN
500.0000 [IU] | Freq: Once | INTRAVENOUS | Status: AC
  Administered 2015-04-25: 500 [IU] via INTRAVENOUS
  Filled 2015-04-25: qty 5

## 2015-04-25 MED ORDER — HEPARIN SOD (PORK) LOCK FLUSH 100 UNIT/ML IV SOLN
500.0000 [IU] | Freq: Once | INTRAVENOUS | Status: DC | PRN
  Filled 2015-04-25: qty 5

## 2015-04-25 MED ORDER — DIPHENHYDRAMINE HCL 50 MG/ML IJ SOLN
25.0000 mg | Freq: Once | INTRAMUSCULAR | Status: AC
  Administered 2015-04-25: 25 mg via INTRAVENOUS

## 2015-04-25 MED ORDER — DIPHENHYDRAMINE HCL 50 MG/ML IJ SOLN
INTRAMUSCULAR | Status: AC
  Filled 2015-04-25: qty 1

## 2015-04-25 MED ORDER — SODIUM CHLORIDE 0.9 % IV SOLN
Freq: Once | INTRAVENOUS | Status: AC
  Administered 2015-04-25: 11:00:00 via INTRAVENOUS

## 2015-04-25 MED ORDER — SODIUM CHLORIDE 0.9 % IJ SOLN
10.0000 mL | Freq: Once | INTRAMUSCULAR | Status: AC
  Administered 2015-04-25: 10 mL via INTRAVENOUS
  Filled 2015-04-25: qty 10

## 2015-04-25 MED ORDER — HEPARIN SOD (PORK) LOCK FLUSH 100 UNIT/ML IV SOLN
250.0000 [IU] | Freq: Once | INTRAVENOUS | Status: DC | PRN
  Filled 2015-04-25: qty 5

## 2015-04-25 NOTE — Patient Instructions (Signed)
Begin ferrous sulfate 325 mg twice a day Begin Senokot S 1-2 tabs twice a day for constipation

## 2015-04-25 NOTE — Patient Instructions (Signed)
[facility] Discharge Instructions for Patients Receiving Chemotherapy  Today you received the following chemotherapy agents: Taxol/ Cyramza  To help prevent nausea and vomiting after your treatment, we encourage you to take your nausea medication: Compazine. Take one every 6 hours as needed.  If you develop nausea and vomiting that is not controlled by your nausea medication, call the clinic.   BELOW ARE SYMPTOMS THAT SHOULD BE REPORTED IMMEDIATELY:  *FEVER GREATER THAN 100.5 F  *CHILLS WITH OR WITHOUT FEVER  NAUSEA AND VOMITING THAT IS NOT CONTROLLED WITH YOUR NAUSEA MEDICATION  *UNUSUAL SHORTNESS OF BREATH  *UNUSUAL BRUISING OR BLEEDING  TENDERNESS IN MOUTH AND THROAT WITH OR WITHOUT PRESENCE OF ULCERS  *URINARY PROBLEMS  *BOWEL PROBLEMS  UNUSUAL RASH Items with * indicate a potential emergency and should be followed up as soon as possible.  Feel free to call the clinic should you have any questions or concerns. The clinic phone number is [phone].  Please show the [facility] at check-in to the Emergency Department and triage nurse.

## 2015-04-25 NOTE — Progress Notes (Signed)
[facility] OFFICE PROGRESS NOTE   Diagnosis:  Gastric cancer  INTERVAL HISTORY:   [name] returns as scheduled. He completed cycle 2 Taxol/cyramza 04/11/2015. He is seen today prior to proceeding with cycle 3. He denies nausea/vomiting. No mouth sores. No diarrhea. No numbness or tingling in his hands or feet. No rash. No pain. No dysphagia. He has a good appetite.  Objective:  Vital signs in last 24 hours:  Blood pressure 144/70, pulse 82, temperature 98 F (36.7 C), temperature source Oral, resp. rate 18, height 5' 4.5" (1.638 m), weight 155 lb (70.308 kg), SpO2 100 %.    HEENT: No thrush or ulcers. Resp: Lungs clear bilaterally. Cardio: Regular rate and rhythm. GI: Abdomen soft and nontender. No hepatomegaly. Vascular: No leg edema. Neuro: Vibratory sense mildly to moderately decreased over the fingertips per tuning fork exam.  Port-A-Cath without erythema.    Lab Results:  Lab Results  Component Value Date   WBC 3.6* 04/25/2015   HGB 7.8* 04/25/2015   HCT 26.1* 04/25/2015   MCV 71.4* 04/25/2015   PLT 267 04/25/2015   NEUTROABS 1.6 04/25/2015    Imaging:  No results found.  Medications: I have reviewed the patient's current medications.  Assessment/Plan: 1.Metastatic gastric cancer-status post biopsy of a gastric mass on 07/20/2012 with the pathology confirming adenocarcinoma, HER-2 non-amplified.  - CT 07/18/2012 consistent with a primary gastric mass and metastatic omental/peritoneal nodules  -biopsy of a peritoneal nodule 07/31/2012 consistent with metastatic adenocarcinoma  -Cycle 1 of FOLFOX on 08/02/2012  -Restaging CT 10/23/2012 consistent with a partial response-decrease in the primary gastric tumor and omental/peritoneal implants  -cycle 10 of FOLFOX completed 01/11/2013  -normal CEA 01/11/2013  -Restaging CT 01/23/2013 with improvement in the peritoneal metastases and an increased size of an indeterminate splenic lesion   -Initiation of maintenance Xeloda therapy on a 7 day on/7 day off schedule 01/29/2013.  -Xeloda dose reduced to 1000 mg twice daily 7 days on/7 days off beginning 02/26/2013 due to nausea and diarrhea.  -Xeloda dose reduced to 500 mg twice daily, 7 days/14 days off 03/20/2013.  -Xeloda discontinued 04/25/2013 due to progressive hand foot syndrome.  -CEA elevated at 9.1 on 04/25/2013.  -CEA stable at 9.5 on 05/09/2013.  -Initiation of infusional 5-FU as per the FOLFOX regimen 05/09/2013.  -CEA 7.4 on 05/24/2013.  -CEA 7.9 on 06/21/2013.  -CEA 9.3 on 08/22/2013 .  -CEA 11.1 on 10/01/2013.  -Restaging CT 10/01/2013 with persistent asymmetric gastric wall thickening in the greater curvature of the mid stomach. Dominant index gastrocolic lymph node decreased in size. Other smaller perigastric lymph nodes stable. No new or progressive findings in or around the stomach. Low-density subcapsular splenic lesion resolved.  -Continuation of infusional 5-fluorouracil, switched to a 4 week schedule beginning 01/02/2014 -CT 03/26/2014 with enlargement of the gastric mass and a possible new liver lesion -FOLFOX chemotherapy resumed 03/27/2014 -Restaging CT scan 06/03/2014 with mild decrease in volume of gastric wall mass. Decrease in volume of perigastric metastatic lymph node. Stable adrenal nodularity. No evidence of disease progression. -Cycle 6 FOLFOX 06/05/2014 - cycle 7 FOLFOX 06/19/2014 -Cycle 8 FOLFOX 07/03/2014 -Cycle 9 FOLFOX 07/17/2014 -Cycle 10 FOLFOX 07/31/2014 -Restaging CT 08/20/2014 revealed stable gastric wall thickening and a slight decrease in the size of a tumor nodule anterior to the stomach -Cycle 11 FOLFOX 08/28/2014 -Restaging CT evaluation 01/28/2015 with local progression of the primary gastric tumor along the greater curvature in the proximal body of the stomach. Progressive peritoneal tumor implants adjacent  to the splenic hilum and in the gastrocolic ligament. New  gastrohepatic ligament lymphadenopathy. New 2.9 cm right liver lobe mass. -Cycle 1 Taxol/cyramza beginning 02/07/2015 -Cycle 2 Taxol/cyramza beginning 03/14/2015 -Cycle 3 Taxol/cyramza beginning to 3 2017  3. Multiple colon polyps documented on a colonoscopy 06/22/2012-tubular adenomas, tubulovillous adenomas, and hyperplastic polyps  4. Family history of colon cancer (mother). Status post genetics evaluation. Negative genetic testing on the Lynch/High Risk colon panel test  5. Pain secondary to the primary gastric mass or carcinomatosis -resolved  6. 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 Enrique Sack, multiple extractions were recommended  7. Anorexia, nausea, and early satiety-improved  8. 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 Nausea following chemotherapy-improved with the addition of prophylactic Decadron  10. Skin rash-most likely secondary to Decadron, improved  11. History of neutropenia secondary to chemotherapy  12. 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 Nausea and diarrhea while treated with Xeloda. Improved.  15. Hand-foot syndrome 03/07/2013 manifesting with erythema, dry desquamation and pain on the palms and erythema and pain on the soles. Progressive hand pain 04/25/2013. Xeloda discontinued.  16. Anemia-microcytic; chronic, stable. Ferritin low at 6 11/21/2013.  17. Delayed nausea following cycle 1 FOLFOX. Aloxi added beginning with cycle 2 04/10/2014.  18. Rash and pruritus during the oxaliplatin infusion with cycle 3 FOLFOX, improved with Benadryl, Solu-Medrol, and Pepcid, no associated symptoms, no rash with cycle 4 FOLFOX 05/08/2014 19. Hives during the oxaliplatin infusion with cycle 11. Resolved with Benadryl. Infusion  was completed.      Disposition: [name] appears stable. He has completed 2 cycles of Taxol/cyramza. He has improved clinically in the CEA has improved as well. Plan to proceed with cycle 3 today as scheduled. Restaging CT evaluation planned after completion of cycle 3.   He has persistent anemia, microcytic, likely due to iron deficiency. At present he is not taking oral iron due to constipating side effects. We discussed taking Senokot-S twice a day with the iron. He will consider trying this.  He will return for a follow-up visit in 2 weeks. He will contact the office in the interim with any problems.  He will return for a follow-up visit in 2 weeks. He will contact the office in the interim with any problems.  Ned Card ANP/GNP-BC   04/25/2015  10:05 AM

## 2015-04-27 ENCOUNTER — Other Ambulatory Visit: Payer: Self-pay | Admitting: Oncology

## 2015-05-02 ENCOUNTER — Ambulatory Visit (HOSPITAL_BASED_OUTPATIENT_CLINIC_OR_DEPARTMENT_OTHER): Payer: Medicare Other | Admitting: Nurse Practitioner

## 2015-05-02 ENCOUNTER — Ambulatory Visit (HOSPITAL_BASED_OUTPATIENT_CLINIC_OR_DEPARTMENT_OTHER): Payer: Medicare Other

## 2015-05-02 VITALS — BP 134/79 | HR 75 | Temp 98.4°F | Resp 18

## 2015-05-02 DIAGNOSIS — Z5111 Encounter for antineoplastic chemotherapy: Secondary | ICD-10-CM

## 2015-05-02 DIAGNOSIS — C787 Secondary malignant neoplasm of liver and intrahepatic bile duct: Secondary | ICD-10-CM | POA: Diagnosis not present

## 2015-05-02 DIAGNOSIS — C169 Malignant neoplasm of stomach, unspecified: Secondary | ICD-10-CM | POA: Diagnosis not present

## 2015-05-02 DIAGNOSIS — T451X5A Adverse effect of antineoplastic and immunosuppressive drugs, initial encounter: Secondary | ICD-10-CM

## 2015-05-02 DIAGNOSIS — M62830 Muscle spasm of back: Secondary | ICD-10-CM | POA: Diagnosis not present

## 2015-05-02 DIAGNOSIS — C162 Malignant neoplasm of body of stomach: Secondary | ICD-10-CM

## 2015-05-02 DIAGNOSIS — D6481 Anemia due to antineoplastic chemotherapy: Secondary | ICD-10-CM | POA: Diagnosis not present

## 2015-05-02 LAB — CBC WITH DIFFERENTIAL/PLATELET
BASO%: 2.8 % — ABNORMAL HIGH (ref 0.0–2.0)
BASOS ABS: 0.1 10*3/uL (ref 0.0–0.1)
EOS ABS: 0.1 10*3/uL (ref 0.0–0.5)
EOS%: 2.8 % (ref 0.0–7.0)
HEMATOCRIT: 25.7 % — AB (ref 38.4–49.9)
HEMOGLOBIN: 7.6 g/dL — AB (ref 13.0–17.1)
LYMPH#: 1.3 10*3/uL (ref 0.9–3.3)
LYMPH%: 30.3 % (ref 14.0–49.0)
MCH: 21.6 pg — AB (ref 27.2–33.4)
MCHC: 29.6 g/dL — ABNORMAL LOW (ref 32.0–36.0)
MCV: 73 fL — AB (ref 79.3–98.0)
MONO#: 0.2 10*3/uL (ref 0.1–0.9)
MONO%: 5 % (ref 0.0–14.0)
NEUT#: 2.6 10*3/uL (ref 1.5–6.5)
NEUT%: 59.1 % (ref 39.0–75.0)
NRBC: 2 % — AB (ref 0–0)
Platelets: 218 10*3/uL (ref 140–400)
RBC: 3.52 10*6/uL — ABNORMAL LOW (ref 4.20–5.82)
RDW: 24.1 % — AB (ref 11.0–14.6)
WBC: 4.4 10*3/uL (ref 4.0–10.3)

## 2015-05-02 LAB — COMPREHENSIVE METABOLIC PANEL
ALBUMIN: 3.5 g/dL (ref 3.5–5.0)
ALK PHOS: 62 U/L (ref 40–150)
ALT: <9 U/L (ref 0–55)
AST: 11 U/L (ref 5–34)
Anion Gap: 8 mEq/L (ref 3–11)
BILIRUBIN TOTAL: 0.45 mg/dL (ref 0.20–1.20)
BUN: 7.6 mg/dL (ref 7.0–26.0)
CALCIUM: 8.3 mg/dL — AB (ref 8.4–10.4)
CO2: 21 mEq/L — ABNORMAL LOW (ref 22–29)
Chloride: 109 mEq/L (ref 98–109)
Creatinine: 0.9 mg/dL (ref 0.7–1.3)
GLUCOSE: 108 mg/dL (ref 70–140)
Potassium: 4.1 mEq/L (ref 3.5–5.1)
SODIUM: 138 meq/L (ref 136–145)
TOTAL PROTEIN: 6.5 g/dL (ref 6.4–8.3)

## 2015-05-02 MED ORDER — HEPARIN SOD (PORK) LOCK FLUSH 100 UNIT/ML IV SOLN
500.0000 [IU] | Freq: Once | INTRAVENOUS | Status: AC | PRN
  Administered 2015-05-02: 500 [IU]
  Filled 2015-05-02: qty 5

## 2015-05-02 MED ORDER — SODIUM CHLORIDE 0.9 % IJ SOLN
10.0000 mL | INTRAMUSCULAR | Status: DC | PRN
  Administered 2015-05-02: 10 mL
  Filled 2015-05-02: qty 10

## 2015-05-02 MED ORDER — FAMOTIDINE IN NACL 20-0.9 MG/50ML-% IV SOLN
20.0000 mg | Freq: Once | INTRAVENOUS | Status: AC
  Administered 2015-05-02: 20 mg via INTRAVENOUS

## 2015-05-02 MED ORDER — DIPHENHYDRAMINE HCL 50 MG/ML IJ SOLN
INTRAMUSCULAR | Status: AC
  Filled 2015-05-02: qty 1

## 2015-05-02 MED ORDER — DIPHENHYDRAMINE HCL 50 MG/ML IJ SOLN
25.0000 mg | Freq: Once | INTRAMUSCULAR | Status: AC | PRN
  Administered 2015-05-02: 25 mg via INTRAVENOUS

## 2015-05-02 MED ORDER — SODIUM CHLORIDE 0.9 % IV SOLN
Freq: Once | INTRAVENOUS | Status: AC
  Administered 2015-05-02: 10:00:00 via INTRAVENOUS

## 2015-05-02 MED ORDER — SODIUM CHLORIDE 0.9 % IV SOLN
Freq: Once | INTRAVENOUS | Status: AC
  Administered 2015-05-02: 10:00:00 via INTRAVENOUS
  Filled 2015-05-02: qty 4

## 2015-05-02 MED ORDER — METHYLPREDNISOLONE SODIUM SUCC 125 MG IJ SOLR
125.0000 mg | Freq: Once | INTRAMUSCULAR | Status: AC | PRN
  Administered 2015-05-02: 125 mg via INTRAVENOUS

## 2015-05-02 MED ORDER — FAMOTIDINE IN NACL 20-0.9 MG/50ML-% IV SOLN
INTRAVENOUS | Status: AC
  Filled 2015-05-02: qty 50

## 2015-05-02 MED ORDER — DIPHENHYDRAMINE HCL 50 MG/ML IJ SOLN
25.0000 mg | Freq: Once | INTRAMUSCULAR | Status: AC
  Administered 2015-05-02: 25 mg via INTRAVENOUS

## 2015-05-02 MED ORDER — PACLITAXEL CHEMO INJECTION 300 MG/50ML
80.0000 mg/m2 | Freq: Once | INTRAVENOUS | Status: AC
  Administered 2015-05-02: 138 mg via INTRAVENOUS
  Filled 2015-05-02: qty 23

## 2015-05-02 NOTE — Progress Notes (Signed)
HGB 7.6 today. Patient asymptomatic. Dr. Benay Spice made aware. OK to tx per Dr. Benay Spice.

## 2015-05-02 NOTE — Progress Notes (Signed)
Patient complains of right sided back pain during Taxol infusion. Patient states he has not ever had this pain before. Patient denies shortness of breath, flushing, or any other concerns.  Taxol Stopped. Vitals signs obtained. Blood pressure 146/99, pulse 76, Temperature 98.4 and oxygen saturation 100%. Patient states pain resolved after infusion stopped for a few minutes.  Selena Lesser, NP notified.   1110 Patient denies any pain. Blood pressure 134/79, pulse 71, oxygen saturation 100%.  [place], NP chairside. 25 mg Benadryl IV push, 125 mg Solu-Medrol IV push then wait 15 minutes per Selena Lesser, NP

## 2015-05-02 NOTE — Progress Notes (Signed)
Taxol infusion restarted at 11:45. No complaints for the remainder of the infusion.

## 2015-05-02 NOTE — Patient Instructions (Signed)
[facility] Discharge Instructions for Patients Receiving Chemotherapy  Today you received the following chemotherapy agents: Taxol.  To help prevent nausea and vomiting after your treatment, we encourage you to take your nausea medication: Compazine. Take one every 6 hours as needed.   If you develop nausea and vomiting that is not controlled by your nausea medication, call the clinic.   BELOW ARE SYMPTOMS THAT SHOULD BE REPORTED IMMEDIATELY:  *FEVER GREATER THAN 100.5 F  *CHILLS WITH OR WITHOUT FEVER  NAUSEA AND VOMITING THAT IS NOT CONTROLLED WITH YOUR NAUSEA MEDICATION  *UNUSUAL SHORTNESS OF BREATH  *UNUSUAL BRUISING OR BLEEDING  TENDERNESS IN MOUTH AND THROAT WITH OR WITHOUT PRESENCE OF ULCERS  *URINARY PROBLEMS  *BOWEL PROBLEMS  UNUSUAL RASH Items with * indicate a potential emergency and should be followed up as soon as possible.  Feel free to call the clinic should you have any questions or concerns. The clinic phone number is [phone].  Please show the CHEMO ALERT CARD at check-in to the Emergency Department and triage nurse.   

## 2015-05-03 ENCOUNTER — Other Ambulatory Visit: Payer: Self-pay | Admitting: Oncology

## 2015-05-04 ENCOUNTER — Encounter: Payer: Self-pay | Admitting: Nurse Practitioner

## 2015-05-04 DIAGNOSIS — M62830 Muscle spasm of back: Secondary | ICD-10-CM | POA: Insufficient documentation

## 2015-05-04 NOTE — Assessment & Plan Note (Signed)
Patient was in the midst of receiving his Taxol infusion; and developed some fairly severe lower back pain/spasm.  Taxol infusion was held; and patient was given Benadryl and Solu-Medrol per hypersensitivity protocol.  Patient denied any other new symptoms whatsoever; and vital signs remained stable.  Patient has had multiple Taxol infusions in the past with no difficulty whatsoever.  Patient completely recovered from the back spasm; and had no other symptoms.  Most likely, patient was experiencing a back spasm versus a true hypersensitivity reaction.  Patient was able to complete all of his chemotherapy today with no further difficulties.

## 2015-05-04 NOTE — Assessment & Plan Note (Signed)
Blood counts obtained today revealed a WBC of 4.4, ANC 2.6, hemoglobin 7.6, and platelet count 218.  Patient has a history of chronic anemia.  He denies any worsening issues with either fatigue or shortness of breath with exertion.  Will continue to monitor closely.

## 2015-05-04 NOTE — Progress Notes (Signed)
SYMPTOM MANAGEMENT CLINIC   HPI: Joseph Salinas 55 y.o. male diagnosed with gastric cancer with liver metastasis.  Currently undergoing taxol/cyramza chemotherapy therapy regimen.   Patient was in the midst of receiving his Taxol infusion; and developed some fairly severe lower back pain/spasm.  Taxol infusion was held; and patient was given Benadryl and Solu-Medrol per hypersensitivity protocol.  Patient denied any other new symptoms whatsoever; and vital signs remained stable.  Patient has had multiple Taxol infusions in the past with no difficulty whatsoever.  Patient completely recovered from the back spasm; and had no other symptoms.  Most likely, patient was experiencing a back spasm versus a true hypersensitivity reaction.  Patient was able to complete all of his chemotherapy today with no further difficulties.  HPI  ROS  Past Medical History  Diagnosis Date  . Colon polyp   . Family history of colon cancer   . Allergy   . Gastric cancer ([place]) 08/09/2012    Past Surgical History  Procedure Laterality Date  . No prior surgery    . Colonoscopy    . Insertion of port a cath Right 08/08/2012  . Esophagogastroduodenoscopy endoscopy    . Biospy  07/31/12    gastric   . Multiple extractions with alveoloplasty N/A 11/10/2012    Procedure: extraction of tooth #'s 1, 2,10,14,15,16,17,18, 23, 24, 25, 26, 32 with alveoloplasty and gross debridement of remaining teeth;  Surgeon: Lenn Cal, DDS;  Location: [facility];  Service: Oral Surgery;  Laterality: N/A;    has Hx of adenomatous colonic polyps; Gastric cancer ([place]); Chronic periodontitis; Family history of colon cancer; Genetic testing; Nausea with vomiting; Insomnia; Vitamin D deficiency; Rash; Antineoplastic chemotherapy induced anemia; and Back spasm on his problem list.    is allergic to penicillins.    Medication List       This list is accurate as of: 05/02/15 11:59 PM.  Always use your most recent med list.                 docusate sodium 50 MG capsule  Commonly known as:  COLACE  Take 1 capsule (50 mg total) by mouth 2 (two) times daily.     HYDROcodone-acetaminophen 5-325 MG tablet  Commonly known as:  NORCO  Take 1-2 tablets by mouth every 6 (six) hours as needed for moderate pain.     lidocaine-prilocaine cream  Commonly known as:  EMLA  Apply 1 application topically as needed. Apply 1 tsp to PAC site 1-2 hours prior to stick and cover with plastic wrap to numb site     ondansetron 8 MG tablet  Commonly known as:  ZOFRAN  Take 1 tablet (8 mg total) by mouth every 8 (eight) hours as needed for nausea or vomiting.     pantoprazole 40 MG tablet  Commonly known as:  PROTONIX  Take 1 tablet (40 mg total) by mouth 2 (two) times daily.     prochlorperazine 10 MG tablet  Commonly known as:  COMPAZINE  Take 1 tablet (10 mg total) by mouth every 6 (six) hours as needed (nausea).     zolpidem 10 MG tablet  Commonly known as:  AMBIEN  Take 1 tablet (10 mg total) by mouth at bedtime as needed for sleep.         PHYSICAL EXAMINATION  Oncology Vitals 05/02/2015 05/02/2015  Height - -  Weight - -  Weight (lbs) - -  BMI (kg/m2) - -  Temp - 98.4  Pulse  75 76  Resp 18 18  SpO2 100 100  BSA (m2) - -   BP Readings from Last 2 Encounters:  05/02/15 134/79  04/25/15 144/70    Physical Exam  Constitutional: He is oriented to person, place, and time and well-developed, well-nourished, and in no distress.  HENT:  Head: Normocephalic and atraumatic.  Mouth/Throat: Oropharynx is clear and moist.  Eyes: Conjunctivae and EOM are normal. Pupils are equal, round, and reactive to light. Right eye exhibits no discharge. Left eye exhibits no discharge. No scleral icterus.  Neck: Normal range of motion.  Pulmonary/Chest: Effort normal. No respiratory distress.  Musculoskeletal: Normal range of motion. He exhibits no edema or tenderness.  Complaining of low back pain.  Mild tenderness on exam only.   No flank pain.  Neurological: He is alert and oriented to person, place, and time. Gait normal.  Skin: Skin is warm and dry.  Psychiatric: Affect normal.    LABORATORY DATA:. Clinical Support on 05/02/2015  Component Date Value Ref Range Status  . WBC 05/02/2015 4.4  4.0 - 10.3 10e3/uL Final  . NEUT# 05/02/2015 2.6  1.5 - 6.5 10e3/uL Final  . HGB 05/02/2015 7.6* 13.0 - 17.1 g/dL Final  . HCT 05/02/2015 25.7* 38.4 - 49.9 % Final  . Platelets 05/02/2015 218  140 - 400 10e3/uL Final  . MCV 05/02/2015 73.0* 79.3 - 98.0 fL Final  . MCH 05/02/2015 21.6* 27.2 - 33.4 pg Final  . MCHC 05/02/2015 29.6* 32.0 - 36.0 g/dL Final  . RBC 05/02/2015 3.52* 4.20 - 5.82 10e6/uL Final  . RDW 05/02/2015 24.1* 11.0 - 14.6 % Final  . lymph# 05/02/2015 1.3  0.9 - 3.3 10e3/uL Final  . MONO# 05/02/2015 0.2  0.1 - 0.9 10e3/uL Final  . Eosinophils Absolute 05/02/2015 0.1  0.0 - 0.5 10e3/uL Final  . Basophils Absolute 05/02/2015 0.1  0.0 - 0.1 10e3/uL Final  . NEUT% 05/02/2015 59.1  39.0 - 75.0 % Final  . LYMPH% 05/02/2015 30.3  14.0 - 49.0 % Final  . MONO% 05/02/2015 5.0  0.0 - 14.0 % Final  . EOS% 05/02/2015 2.8  0.0 - 7.0 % Final  . BASO% 05/02/2015 2.8* 0.0 - 2.0 % Final  . nRBC 05/02/2015 2* 0 - 0 % Final  . Sodium 05/02/2015 138  136 - 145 mEq/L Final  . Potassium 05/02/2015 4.1  3.5 - 5.1 mEq/L Final  . Chloride 05/02/2015 109  98 - 109 mEq/L Final  . CO2 05/02/2015 21* 22 - 29 mEq/L Final  . Glucose 05/02/2015 108  70 - 140 mg/dl Final   Glucose reference range is for nonfasting patients. Fasting glucose reference range is 70- 100.  Marland Kitchen BUN 05/02/2015 7.6  7.0 - 26.0 mg/dL Final  . Creatinine 05/02/2015 0.9  0.7 - 1.3 mg/dL Final  . Total Bilirubin 05/02/2015 0.45  0.20 - 1.20 mg/dL Final  . Alkaline Phosphatase 05/02/2015 62  40 - 150 U/L Final  . AST 05/02/2015 11  5 - 34 U/L Final  . ALT 05/02/2015 <9  0 - 55 U/L Final  . Total Protein 05/02/2015 6.5  6.4 - 8.3 g/dL Final  . Albumin 05/02/2015  3.5  3.5 - 5.0 g/dL Final  . Calcium 05/02/2015 8.3* 8.4 - 10.4 mg/dL Final  . Anion Gap 05/02/2015 8  3 - 11 mEq/L Final  . EGFR 05/02/2015 >90  >90 ml/min/1.73 m2 Final   eGFR is calculated using the CKD-EPI Creatinine Equation (2009)     RADIOGRAPHIC STUDIES:  No results found.  ASSESSMENT/PLAN:    Gastric cancer Pt presented to the cancer center today to receive cycle 10 of his taxol/cyramza chemotherapy regimen.  Patient is scheduled to return on 05/09/2015 for labs, visit, and chemotherapy.  Back spasm Patient was in the midst of receiving his Taxol infusion; and developed some fairly severe lower back pain/spasm.  Taxol infusion was held; and patient was given Benadryl and Solu-Medrol per hypersensitivity protocol.  Patient denied any other new symptoms whatsoever; and vital signs remained stable.  Patient has had multiple Taxol infusions in the past with no difficulty whatsoever.  Patient completely recovered from the back spasm; and had no other symptoms.  Most likely, patient was experiencing a back spasm versus a true hypersensitivity reaction.  Patient was able to complete all of his chemotherapy today with no further difficulties.  Antineoplastic chemotherapy induced anemia Blood counts obtained today revealed a WBC of 4.4, ANC 2.6, hemoglobin 7.6, and platelet count 218.  Patient has a history of chronic anemia.  He denies any worsening issues with either fatigue or shortness of breath with exertion.  Will continue to monitor closely.  Patient stated understanding of all instructions; and was in agreement with this plan of care. The patient knows to call the clinic with any problems, questions or concerns.   Review/collaboration with Dr. Benay Spice regarding all aspects of patient's visit today.   Total time spent with patient was 15 minutes;  with greater than 75 percent of that time spent in face to face counseling regarding patient's symptoms,  and coordination of care  and follow up.  Disclaimer:This dictation was prepared with Dragon/digital dictation along with [employer]. Any transcriptional errors that result from this process are unintentional.  Drue Second, NP 05/04/2015

## 2015-05-04 NOTE — Assessment & Plan Note (Signed)
Pt presented to the cancer center today to receive cycle 10 of his taxol/cyramza chemotherapy regimen.  Patient is scheduled to return on 05/09/2015 for labs, visit, and chemotherapy.

## 2015-05-05 ENCOUNTER — Telehealth: Payer: Self-pay | Admitting: Nurse Practitioner

## 2015-05-05 NOTE — Telephone Encounter (Signed)
Patient already on schedule for lab/fu/tx 2/17. Not other orders per 2/12 pof.

## 2015-05-09 ENCOUNTER — Encounter: Payer: Self-pay | Admitting: Oncology

## 2015-05-09 ENCOUNTER — Ambulatory Visit (HOSPITAL_BASED_OUTPATIENT_CLINIC_OR_DEPARTMENT_OTHER): Payer: Medicare Other

## 2015-05-09 ENCOUNTER — Other Ambulatory Visit (HOSPITAL_BASED_OUTPATIENT_CLINIC_OR_DEPARTMENT_OTHER): Payer: Medicare Other

## 2015-05-09 ENCOUNTER — Ambulatory Visit (HOSPITAL_BASED_OUTPATIENT_CLINIC_OR_DEPARTMENT_OTHER): Payer: Medicare Other | Admitting: Oncology

## 2015-05-09 VITALS — BP 128/71 | HR 83 | Temp 98.2°F | Resp 18 | Ht 64.5 in | Wt 150.6 lb

## 2015-05-09 DIAGNOSIS — C169 Malignant neoplasm of stomach, unspecified: Secondary | ICD-10-CM

## 2015-05-09 DIAGNOSIS — G620 Drug-induced polyneuropathy: Secondary | ICD-10-CM | POA: Diagnosis not present

## 2015-05-09 DIAGNOSIS — Z5111 Encounter for antineoplastic chemotherapy: Secondary | ICD-10-CM | POA: Diagnosis not present

## 2015-05-09 DIAGNOSIS — D509 Iron deficiency anemia, unspecified: Secondary | ICD-10-CM | POA: Diagnosis not present

## 2015-05-09 DIAGNOSIS — C162 Malignant neoplasm of body of stomach: Secondary | ICD-10-CM

## 2015-05-09 DIAGNOSIS — C786 Secondary malignant neoplasm of retroperitoneum and peritoneum: Secondary | ICD-10-CM

## 2015-05-09 DIAGNOSIS — Z5112 Encounter for antineoplastic immunotherapy: Secondary | ICD-10-CM

## 2015-05-09 DIAGNOSIS — C168 Malignant neoplasm of overlapping sites of stomach: Secondary | ICD-10-CM

## 2015-05-09 LAB — CBC WITH DIFFERENTIAL/PLATELET
BASO%: 2.1 % — AB (ref 0.0–2.0)
Basophils Absolute: 0.1 10*3/uL (ref 0.0–0.1)
EOS%: 5.1 % (ref 0.0–7.0)
Eosinophils Absolute: 0.2 10*3/uL (ref 0.0–0.5)
HEMATOCRIT: 24.8 % — AB (ref 38.4–49.9)
HEMOGLOBIN: 7.4 g/dL — AB (ref 13.0–17.1)
LYMPH#: 1.2 10*3/uL (ref 0.9–3.3)
LYMPH%: 34.8 % (ref 14.0–49.0)
MCH: 22.2 pg — ABNORMAL LOW (ref 27.2–33.4)
MCHC: 29.8 g/dL — AB (ref 32.0–36.0)
MCV: 74.3 fL — ABNORMAL LOW (ref 79.3–98.0)
MONO#: 0.2 10*3/uL (ref 0.1–0.9)
MONO%: 6 % (ref 0.0–14.0)
NEUT%: 52 % (ref 39.0–75.0)
NEUTROS ABS: 1.7 10*3/uL (ref 1.5–6.5)
NRBC: 1 % — AB (ref 0–0)
Platelets: 183 10*3/uL (ref 140–400)
RBC: 3.34 10*6/uL — ABNORMAL LOW (ref 4.20–5.82)
RDW: 25.5 % — AB (ref 11.0–14.6)
WBC: 3.3 10*3/uL — AB (ref 4.0–10.3)

## 2015-05-09 LAB — COMPREHENSIVE METABOLIC PANEL
AST: 11 U/L (ref 5–34)
Albumin: 3.5 g/dL (ref 3.5–5.0)
Alkaline Phosphatase: 56 U/L (ref 40–150)
Anion Gap: 7 mEq/L (ref 3–11)
BUN: 10.6 mg/dL (ref 7.0–26.0)
CALCIUM: 8.5 mg/dL (ref 8.4–10.4)
CHLORIDE: 110 meq/L — AB (ref 98–109)
CO2: 23 mEq/L (ref 22–29)
CREATININE: 0.8 mg/dL (ref 0.7–1.3)
EGFR: >90 mL/min/{1.73_m2} (ref >90)
Glucose: 102 mg/dl (ref 70–140)
Potassium: 3.8 mEq/L (ref 3.5–5.1)
Sodium: 140 mEq/L (ref 136–145)
TOTAL PROTEIN: 6.5 g/dL (ref 6.4–8.3)
Total Bilirubin: 0.52 mg/dL (ref 0.20–1.20)

## 2015-05-09 LAB — UA PROTEIN, DIPSTICK - CHCC

## 2015-05-09 MED ORDER — ACETAMINOPHEN 325 MG PO TABS
ORAL_TABLET | ORAL | Status: AC
  Filled 2015-05-09: qty 2

## 2015-05-09 MED ORDER — DIPHENHYDRAMINE HCL 50 MG/ML IJ SOLN
25.0000 mg | Freq: Once | INTRAMUSCULAR | Status: AC
  Administered 2015-05-09: 25 mg via INTRAVENOUS

## 2015-05-09 MED ORDER — SODIUM CHLORIDE 0.9 % IV SOLN
Freq: Once | INTRAVENOUS | Status: AC
  Administered 2015-05-09: 11:00:00 via INTRAVENOUS

## 2015-05-09 MED ORDER — DIPHENHYDRAMINE HCL 50 MG/ML IJ SOLN
INTRAMUSCULAR | Status: AC
  Filled 2015-05-09: qty 1

## 2015-05-09 MED ORDER — PACLITAXEL CHEMO INJECTION 300 MG/50ML
80.0000 mg/m2 | Freq: Once | INTRAVENOUS | Status: AC
  Administered 2015-05-09: 138 mg via INTRAVENOUS
  Filled 2015-05-09: qty 23

## 2015-05-09 MED ORDER — SODIUM CHLORIDE 0.9 % IV SOLN
Freq: Once | INTRAVENOUS | Status: AC
  Administered 2015-05-09: 11:00:00 via INTRAVENOUS
  Filled 2015-05-09: qty 4

## 2015-05-09 MED ORDER — SODIUM CHLORIDE 0.9 % IV SOLN
8.0000 mg/kg | Freq: Once | INTRAVENOUS | Status: AC
  Administered 2015-05-09: 500 mg via INTRAVENOUS
  Filled 2015-05-09: qty 50

## 2015-05-09 MED ORDER — FAMOTIDINE IN NACL 20-0.9 MG/50ML-% IV SOLN
20.0000 mg | Freq: Once | INTRAVENOUS | Status: AC
  Administered 2015-05-09: 20 mg via INTRAVENOUS

## 2015-05-09 MED ORDER — SODIUM CHLORIDE 0.9 % IJ SOLN
10.0000 mL | INTRAMUSCULAR | Status: DC | PRN
  Administered 2015-05-09: 10 mL
  Filled 2015-05-09: qty 10

## 2015-05-09 MED ORDER — HEPARIN SOD (PORK) LOCK FLUSH 100 UNIT/ML IV SOLN
500.0000 [IU] | Freq: Once | INTRAVENOUS | Status: AC | PRN
  Administered 2015-05-09: 500 [IU]
  Filled 2015-05-09: qty 5

## 2015-05-09 MED ORDER — FAMOTIDINE IN NACL 20-0.9 MG/50ML-% IV SOLN
INTRAVENOUS | Status: AC
  Filled 2015-05-09: qty 50

## 2015-05-09 MED ORDER — ACETAMINOPHEN 325 MG PO TABS
650.0000 mg | ORAL_TABLET | Freq: Once | ORAL | Status: AC
  Administered 2015-05-09: 650 mg via ORAL

## 2015-05-09 NOTE — Progress Notes (Signed)
Met with patient in treatment who had some financial concerns regarding his balance and copays. Reviewed pt's account and looked to see if there was any copay assistance for his diagnosis and insurance coverage. Advised patient there is currently no copay assistance available for his diagnosis at this time. Patient was thinking he had two insurance coverages because he has a traditional medicare card and a [place] Medicare card. I explained to him that with the [facility] card, Medicare is combined so it is technically one coverage. I gave his spouse a Medicaid application to complete and bring back along with supporting documents for me to fax. They both verbalized understanding. I gave the my card for any additional financial questions or concerns.

## 2015-05-09 NOTE — Addendum Note (Signed)
Addended by: Betsy Coder B on: 05/09/2015 02:49 PM   Modules accepted: Level of Service

## 2015-05-09 NOTE — Progress Notes (Signed)
[facility] OFFICE PROGRESS NOTE   Diagnosis: Gastric cancer  INTERVAL HISTORY:   Joseph Salinas returns as scheduled. He continues Taxol/ramucirumab. He developed lower back pain during the Taxol infusion 05/02/2015. He reports a good appetite. No bleeding. He is taking iron once daily.  Objective:  Vital signs in last 24 hours:  Blood pressure 128/71, pulse 83, temperature 98.2 F (36.8 C), temperature source Oral, resp. rate 18, height 5' 4.5" (1.638 m), weight 150 lb 9.6 oz (68.312 kg), SpO2 100 %.    HEENT: No thrush, ulcer at the lateral right upper lip Resp: Lungs Clear bilaterally Cardio: Regular rate and rhythm GI: No hepatomegaly, soft and nontender, no mass Vascular: No leg edema  Skin: Acne type rash over the trunk   Portacath/PICC-without erythema  Lab Results:  Lab Results  Component Value Date   WBC 3.3* 05/09/2015   HGB 7.4* 05/09/2015   HCT 24.8* 05/09/2015   MCV 74.3* 05/09/2015   PLT 183 05/09/2015   NEUTROABS 1.7 05/09/2015     Medications: I have reviewed the patient's current medications.  Assessment/Plan: 1.Metastatic gastric cancer-status post biopsy of a gastric mass on 07/20/2012 with the pathology confirming adenocarcinoma, HER-2 non-amplified.  - CT 07/18/2012 consistent with a primary gastric mass and metastatic omental/peritoneal nodules  -biopsy of a peritoneal nodule 07/31/2012 consistent with metastatic adenocarcinoma  -Cycle 1 of FOLFOX on 08/02/2012  -Restaging CT 10/23/2012 consistent with a partial response-decrease in the primary gastric tumor and omental/peritoneal implants  -cycle 10 of FOLFOX completed 01/11/2013  -normal CEA 01/11/2013  -Restaging CT 01/23/2013 with improvement in the peritoneal metastases and an increased size of an indeterminate splenic lesion  -Initiation of maintenance Xeloda therapy on a 7 day on/7 day off schedule 01/29/2013.  -Xeloda dose reduced to 1000 mg twice daily 7 days  on/7 days off beginning 02/26/2013 due to nausea and diarrhea.  -Xeloda dose reduced to 500 mg twice daily, 7 days/14 days off 03/20/2013.  -Xeloda discontinued 04/25/2013 due to progressive hand foot syndrome.  -CEA elevated at 9.1 on 04/25/2013.  -CEA stable at 9.5 on 05/09/2013.  -Initiation of infusional 5-FU as per the FOLFOX regimen 05/09/2013.  -CEA 7.4 on 05/24/2013.  -CEA 7.9 on 06/21/2013.  -CEA 9.3 on 08/22/2013 .  -CEA 11.1 on 10/01/2013.  -Restaging CT 10/01/2013 with persistent asymmetric gastric wall thickening in the greater curvature of the mid stomach. Dominant index gastrocolic lymph node decreased in size. Other smaller perigastric lymph nodes stable. No new or progressive findings in or around the stomach. Low-density subcapsular splenic lesion resolved.  -Continuation of infusional 5-fluorouracil, switched to a 4 week schedule beginning 01/02/2014 -CT 03/26/2014 with enlargement of the gastric mass and a possible new liver lesion -FOLFOX chemotherapy resumed 03/27/2014 -Restaging CT scan 06/03/2014 with mild decrease in volume of gastric wall mass. Decrease in volume of perigastric metastatic lymph node. Stable adrenal nodularity. No evidence of disease progression. -Cycle 6 FOLFOX 06/05/2014 - cycle 7 FOLFOX 06/19/2014 -Cycle 8 FOLFOX 07/03/2014 -Cycle 9 FOLFOX 07/17/2014 -Cycle 10 FOLFOX 07/31/2014 -Restaging CT 08/20/2014 revealed stable gastric wall thickening and a slight decrease in the size of a tumor nodule anterior to the stomach -Cycle 11 FOLFOX 08/28/2014 -Restaging CT evaluation 01/28/2015 with local progression of the primary gastric tumor along the greater curvature in the proximal body of the stomach. Progressive peritoneal tumor implants adjacent to the splenic hilum and in the gastrocolic ligament. New gastrohepatic ligament lymphadenopathy. New 2.9 cm right liver lobe mass. -Cycle 1 Taxol/cyramza beginning  02/07/2015 -Cycle 2 Taxol/cyramza  beginning 03/14/2015 -Cycle 3 Taxol/cyramza beginning to 04/25/2015  3. Multiple colon polyps documented on a colonoscopy 06/22/2012-tubular adenomas, tubulovillous adenomas, and hyperplastic polyps  4. Family history of colon cancer (mother). Status post genetics evaluation. Negative genetic testing on the Lynch/High Risk colon panel test  5. Pain secondary to the primary gastric mass or carcinomatosis -resolved  6. 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 Enrique Sack, multiple extractions were recommended  7. Anorexia, nausea, and early satiety-improved  8. 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 Nausea following chemotherapy-improved with the addition of prophylactic Decadron  10. Skin rash-most likely secondary to Decadron, improved  11. History of neutropenia secondary to chemotherapy  12. 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 Nausea and diarrhea while treated with Xeloda. Improved.  15. Hand-foot syndrome 03/07/2013 manifesting with erythema, dry desquamation and pain on the palms and erythema and pain on the soles. Progressive hand pain 04/25/2013. Xeloda discontinued.  16. Anemia-microcytic; chronic, stable. Ferritin low at 6 11/21/2013.  17. Delayed nausea following cycle 1 FOLFOX. Aloxi added beginning with cycle 2 04/10/2014.  18. Rash and pruritus during the oxaliplatin infusion with cycle 3 FOLFOX, improved with Benadryl, Solu-Medrol, and Pepcid, no associated symptoms, no rash with cycle 4 FOLFOX 05/08/2014 19. Hives during the oxaliplatin infusion with cycle 11. Resolved with Benadryl. Infusion was completed.      Disposition:  Joseph Salinas appears stable. The plan is to continue Taxol/ramucirumab. It is unclear whether the  back pain last week was related to Taxol.  He will increase the iron supplement to 3 times per day. We will consider IV iron and transfusion if the hemoglobin does not improve over the next few weeks. We will follow-up on the CEA from today. He will be scheduled for a restaging CT evaluation in the next one to 2 months.   Betsy Coder, MD  05/09/2015  2:45 PM

## 2015-05-09 NOTE — Progress Notes (Signed)
Ok to treat with Hgb 7.4 per Dr. Learta Codding

## 2015-05-09 NOTE — Patient Instructions (Signed)
[facility] Discharge Instructions for Patients Receiving Chemotherapy  Today you received the following chemotherapy agents Cyramza/Taxol  To help prevent nausea and vomiting after your treatment, we encourage you to take your nausea medication as prescribed. If you develop nausea and vomiting that is not controlled by your nausea medication, call the clinic.   BELOW ARE SYMPTOMS THAT SHOULD BE REPORTED IMMEDIATELY:  *FEVER GREATER THAN 100.5 F  *CHILLS WITH OR WITHOUT FEVER  NAUSEA AND VOMITING THAT IS NOT CONTROLLED WITH YOUR NAUSEA MEDICATION  *UNUSUAL SHORTNESS OF BREATH  *UNUSUAL BRUISING OR BLEEDING  TENDERNESS IN MOUTH AND THROAT WITH OR WITHOUT PRESENCE OF ULCERS  *URINARY PROBLEMS  *BOWEL PROBLEMS  UNUSUAL RASH Items with * indicate a potential emergency and should be followed up as soon as possible.  Feel free to call the clinic you have any questions or concerns. The clinic phone number is [phone].  Please show the [facility] at check-in to the Emergency Department and triage nurse.

## 2015-05-11 ENCOUNTER — Other Ambulatory Visit: Payer: Self-pay | Admitting: Oncology

## 2015-05-13 ENCOUNTER — Telehealth: Payer: Self-pay | Admitting: Oncology

## 2015-05-13 NOTE — Telephone Encounter (Signed)
appt made per 2/17 pof. Pt will get updated schedule on 2/24 visit

## 2015-05-16 ENCOUNTER — Telehealth: Payer: Self-pay | Admitting: *Deleted

## 2015-05-16 ENCOUNTER — Encounter: Payer: Self-pay | Admitting: Nurse Practitioner

## 2015-05-16 ENCOUNTER — Other Ambulatory Visit (HOSPITAL_BASED_OUTPATIENT_CLINIC_OR_DEPARTMENT_OTHER): Payer: Medicare Other

## 2015-05-16 ENCOUNTER — Ambulatory Visit: Payer: Medicare Other

## 2015-05-16 ENCOUNTER — Telehealth: Payer: Self-pay | Admitting: Nurse Practitioner

## 2015-05-16 ENCOUNTER — Ambulatory Visit (HOSPITAL_BASED_OUTPATIENT_CLINIC_OR_DEPARTMENT_OTHER): Payer: Medicare Other

## 2015-05-16 ENCOUNTER — Other Ambulatory Visit: Payer: Self-pay | Admitting: Nurse Practitioner

## 2015-05-16 VITALS — BP 143/83 | HR 81 | Temp 98.3°F | Resp 18

## 2015-05-16 DIAGNOSIS — C162 Malignant neoplasm of body of stomach: Secondary | ICD-10-CM

## 2015-05-16 DIAGNOSIS — C168 Malignant neoplasm of overlapping sites of stomach: Secondary | ICD-10-CM

## 2015-05-16 DIAGNOSIS — C169 Malignant neoplasm of stomach, unspecified: Secondary | ICD-10-CM

## 2015-05-16 DIAGNOSIS — D509 Iron deficiency anemia, unspecified: Secondary | ICD-10-CM

## 2015-05-16 DIAGNOSIS — Z95828 Presence of other vascular implants and grafts: Secondary | ICD-10-CM

## 2015-05-16 HISTORY — DX: Iron deficiency anemia, unspecified: D50.9

## 2015-05-16 LAB — CBC WITH DIFFERENTIAL/PLATELET
BASO%: 2.8 % — ABNORMAL HIGH (ref 0.0–2.0)
Basophils Absolute: 0.1 10*3/uL (ref 0.0–0.1)
EOS ABS: 0.1 10*3/uL (ref 0.0–0.5)
EOS%: 2.3 % (ref 0.0–7.0)
HCT: 23 % — ABNORMAL LOW (ref 38.4–49.9)
HGB: 6.9 g/dL — CL (ref 13.0–17.1)
LYMPH%: 49.1 % — AB (ref 14.0–49.0)
MCH: 22.7 pg — ABNORMAL LOW (ref 27.2–33.4)
MCHC: 30 g/dL — AB (ref 32.0–36.0)
MCV: 75.7 fL — AB (ref 79.3–98.0)
MONO#: 0.1 10*3/uL (ref 0.1–0.9)
MONO%: 6.5 % (ref 0.0–14.0)
NEUT%: 39.3 % (ref 39.0–75.0)
NEUTROS ABS: 0.9 10*3/uL — AB (ref 1.5–6.5)
PLATELETS: 266 10*3/uL (ref 140–400)
RBC: 3.04 10*6/uL — AB (ref 4.20–5.82)
RDW: 27.7 % — ABNORMAL HIGH (ref 11.0–14.6)
WBC: 2.2 10*3/uL — AB (ref 4.0–10.3)
lymph#: 1.1 10*3/uL (ref 0.9–3.3)
nRBC: 3 % — ABNORMAL HIGH (ref 0–0)

## 2015-05-16 LAB — COMPREHENSIVE METABOLIC PANEL
ALK PHOS: 53 U/L (ref 40–150)
ALT: <9 U/L (ref 0–55)
ANION GAP: 5 meq/L (ref 3–11)
AST: 11 U/L (ref 5–34)
Albumin: 3.4 g/dL — ABNORMAL LOW (ref 3.5–5.0)
BILIRUBIN TOTAL: 0.43 mg/dL (ref 0.20–1.20)
BUN: 9.7 mg/dL (ref 7.0–26.0)
CALCIUM: 8.5 mg/dL (ref 8.4–10.4)
CO2: 23 mEq/L (ref 22–29)
CREATININE: 0.8 mg/dL (ref 0.7–1.3)
Chloride: 112 mEq/L — ABNORMAL HIGH (ref 98–109)
EGFR: >90 mL/min/{1.73_m2} (ref >90)
Glucose: 106 mg/dl (ref 70–140)
POTASSIUM: 4.2 meq/L (ref 3.5–5.1)
Sodium: 141 mEq/L (ref 136–145)
Total Protein: 6.3 g/dL — ABNORMAL LOW (ref 6.4–8.3)

## 2015-05-16 MED ORDER — SODIUM CHLORIDE 0.9 % IV SOLN
Freq: Once | INTRAVENOUS | Status: AC
  Administered 2015-05-16: 10:00:00 via INTRAVENOUS

## 2015-05-16 MED ORDER — SODIUM CHLORIDE 0.9% FLUSH
10.0000 mL | INTRAVENOUS | Status: DC | PRN
  Administered 2015-05-16: 10 mL via INTRAVENOUS
  Filled 2015-05-16: qty 10

## 2015-05-16 MED ORDER — SODIUM CHLORIDE 0.9 % IV SOLN
510.0000 mg | Freq: Once | INTRAVENOUS | Status: AC
  Administered 2015-05-16: 510 mg via INTRAVENOUS
  Filled 2015-05-16: qty 17

## 2015-05-16 MED ORDER — HEPARIN SOD (PORK) LOCK FLUSH 100 UNIT/ML IV SOLN
500.0000 [IU] | Freq: Once | INTRAVENOUS | Status: AC | PRN
  Administered 2015-05-16: 500 [IU]
  Filled 2015-05-16: qty 5

## 2015-05-16 MED ORDER — SODIUM CHLORIDE 0.9 % IJ SOLN
10.0000 mL | INTRAMUSCULAR | Status: DC | PRN
  Administered 2015-05-16: 10 mL
  Filled 2015-05-16: qty 10

## 2015-05-16 NOTE — Telephone Encounter (Signed)
Per staff message and POF I have scheduled appts. Advised scheduler of appts. JMW  

## 2015-05-16 NOTE — Patient Instructions (Addendum)
Ferumoxytol injection Qu es este medicamento? El FERUMOXYTOL es un complejo de hierro. El hierro se [place] para la produccin de glbulos rojos sanos, los cuales transportan el oxgeno y los nutrientes hacia todo el cuerpo. Este medicamento se [place] para tratar la anemia por falta de hierro a las personas con enfermedad renal crnica. Este medicamento puede ser utilizado para otros usos; si tiene alguna pregunta consulte con su proveedor de atencin mdica o con su farmacutico. Qu le debo informar a mi profesional de la salud antes de tomar este medicamento? Necesita saber si usted presenta alguno de los [employer] o situaciones: -anemia no provocada por niveles bajos de hierro -niveles altos de hierro en la sangre -examen de imgenes por Health visitor (IRM) programado -una reaccin alrgica o inusual al hierro, otros medicamentos, alimentos, colorantes o conservantes -si est embarazada o buscando quedar embarazada -si est amamantando a un beb Cmo debo Insurance account manager medicamento? Este medicamento se administra mediante inyeccin por va intravenosa. Lo administra un profesional de Technical sales engineer en un hospital o en un entorno clnico. Hable con su pediatra para informarse acerca del uso de este medicamento en nios. Puede requerir atencin especial. Sobredosis: Pngase en contacto inmediatamente con un centro toxicolgico o una sala de urgencia si usted cree que haya tomado demasiado medicamento. ATENCIN: [employer] es solo para usted. No comparta este medicamento con nadie. Qu sucede si me olvido de una dosis? Es importante no olvidar ninguna dosis. Informe a su mdico o a su profesional de la salud si no puede asistir a Photographer. Qu puede interactuar con este medicamento? Esta medicina puede interactuar con los siguientes medicamentos: -otros productos de hierro Puede ser que esta lista no menciona todas las posibles interacciones. Informe a su profesional de  [employer]	[place] de [employer] productos a base de hierbas, medicamentos de [place] o suplementos nutritivos que est tomando. Si usted fuma, consume bebidas alcohlicas o si utiliza drogas ilegales, indqueselo tambin a su profesional de [employer]	[place]. Algunas sustancias pueden interactuar con su medicamento. A qu debo estar atento al usar [employer]? Visite a su mdico o a su profesional de la salud de [place] regular. Si los sntomas no comienzan a mejorar o si empeoran, consulte con su mdico o con su profesional de [employer]	[place]. Tal vez necesita realizarse anlisis de sangre mientras recibe [place]. Tal vez necesita seguir Counselling psychologist. Consulte a su mdico. Los alimentos que contienen hierro incluyen: alimentos integrales o con cereales, frutas secas, frijoles o arvejas, vegetales de hoja verde y carne que proviene de rganos (hgado, rin). Qu efectos secundarios puedo tener al [employer] este medicamento? Efectos secundarios que debe informar a su mdico o a Barrister's clerk de la salud tan pronto como sea posible: -Chief of Staff como erupcin cutnea, picazn o urticarias, hinchazn de la cara, labios o lengua -problemas respiratorios -cambios en la presin sangunea -sensacin de desmayos o mareos, cadas -fiebre o escalofros -enrojecimiento, sudoracin o sensacin de calor -hinchazn de los tobillos o pies Efectos secundarios que, por lo general, no requieren atencin mdica (debe informarlos a su mdico o a su profesional de la salud si persisten o si son molestos): -diarrea -dolor de cabeza -nuseas, vmito -Higher education careers adviser Puede ser que esta lista no menciona todos los posibles efectos secundarios. Comunquese a su mdico por asesoramiento mdico [employer]. Usted puede informar los efectos secundarios a la FDA por telfono al 1-800-FDA-1088. Dnde debo guardar mi medicina? Este medicamento se administra en hospitales o clnicas  y no necesitar  guardarlo en su domicilio. ATENCIN: Este folleto es un resumen. Puede ser que no cubra toda la posible informacin. Si usted tiene preguntas acerca de esta medicina, consulte con su mdico, su farmacutico o su profesional de Technical sales engineer.    2016, Elsevier/Gold Standard. (2014-05-01 00:00:00)

## 2015-05-16 NOTE — Telephone Encounter (Signed)
Sent msg to add Feraheme per 02/24 POF, added labs will call pt after Shirlean Kelly is added... KJ

## 2015-05-16 NOTE — Progress Notes (Signed)
Pt labs reviewed by Dr. Benay Spice. ANC 0.9 and Hg6.9 today. Per Dr. Benay Spice, no chemo todayd/t low anc and obtained order to have pt get IV Feraheme today.   Pt toloerated first time feraheme iv. No complications or complaints at this time. avs and schedule printed for pt.

## 2015-05-17 LAB — CEA (PARALLEL TESTING): CEA: 19.3 ng/mL — AB

## 2015-05-17 LAB — CEA: CEA1: 21 ng/mL — AB (ref 0.0–4.7)

## 2015-05-18 ENCOUNTER — Other Ambulatory Visit: Payer: Self-pay | Admitting: Oncology

## 2015-05-23 ENCOUNTER — Ambulatory Visit (HOSPITAL_BASED_OUTPATIENT_CLINIC_OR_DEPARTMENT_OTHER): Payer: Medicare Other

## 2015-05-23 ENCOUNTER — Other Ambulatory Visit (HOSPITAL_BASED_OUTPATIENT_CLINIC_OR_DEPARTMENT_OTHER): Payer: Medicare Other

## 2015-05-23 ENCOUNTER — Telehealth: Payer: Self-pay | Admitting: *Deleted

## 2015-05-23 VITALS — BP 150/80 | HR 84 | Temp 97.5°F | Resp 18

## 2015-05-23 DIAGNOSIS — D509 Iron deficiency anemia, unspecified: Secondary | ICD-10-CM | POA: Diagnosis not present

## 2015-05-23 DIAGNOSIS — C169 Malignant neoplasm of stomach, unspecified: Secondary | ICD-10-CM

## 2015-05-23 LAB — CBC WITH DIFFERENTIAL/PLATELET
BASO%: 1.7 % (ref 0.0–2.0)
BASOS ABS: 0.1 10*3/uL (ref 0.0–0.1)
EOS%: 2.5 % (ref 0.0–7.0)
Eosinophils Absolute: 0.1 10*3/uL (ref 0.0–0.5)
HEMATOCRIT: 29.9 % — AB (ref 38.4–49.9)
HGB: 9.1 g/dL — ABNORMAL LOW (ref 13.0–17.1)
LYMPH%: 31.9 % (ref 14.0–49.0)
MCH: 24.5 pg — AB (ref 27.2–33.4)
MCHC: 30.6 g/dL — AB (ref 32.0–36.0)
MCV: 79.9 fL (ref 79.3–98.0)
MONO#: 0.7 10*3/uL (ref 0.1–0.9)
MONO%: 19.1 % — AB (ref 0.0–14.0)
NEUT#: 1.7 10*3/uL (ref 1.5–6.5)
NEUT%: 44.8 % (ref 39.0–75.0)
Platelets: 237 10*3/uL (ref 140–400)
RBC: 3.74 10*6/uL — ABNORMAL LOW (ref 4.20–5.82)
RDW: 33.1 % — AB (ref 11.0–14.6)
WBC: 3.7 10*3/uL — ABNORMAL LOW (ref 4.0–10.3)
lymph#: 1.2 10*3/uL (ref 0.9–3.3)

## 2015-05-23 MED ORDER — SODIUM CHLORIDE 0.9 % IV SOLN
510.0000 mg | Freq: Once | INTRAVENOUS | Status: AC
  Administered 2015-05-23: 510 mg via INTRAVENOUS
  Filled 2015-05-23: qty 17

## 2015-05-23 MED ORDER — SODIUM CHLORIDE 0.9 % IJ SOLN
10.0000 mL | INTRAMUSCULAR | Status: DC | PRN
  Administered 2015-05-23: 10 mL
  Filled 2015-05-23: qty 10

## 2015-05-23 MED ORDER — SODIUM CHLORIDE 0.9 % IV SOLN
Freq: Once | INTRAVENOUS | Status: AC
  Administered 2015-05-23: 14:00:00 via INTRAVENOUS

## 2015-05-23 MED ORDER — HEPARIN SOD (PORK) LOCK FLUSH 100 UNIT/ML IV SOLN
500.0000 [IU] | Freq: Once | INTRAVENOUS | Status: AC | PRN
  Administered 2015-05-23: 500 [IU]
  Filled 2015-05-23: qty 5

## 2015-05-23 NOTE — Progress Notes (Signed)
Pt hg today 9.1. Increased from last time 6.9. Pt states he feels so much better with more energy. Tolerated feraheme IV today. 30 min post observation done wo any concerns. VS stable and avs printed for pt schedule next week.

## 2015-05-23 NOTE — Patient Instructions (Signed)
Ferumoxytol injection Qu es este medicamento? El FERUMOXYTOL es un complejo de hierro. El hierro se [place] para la produccin de glbulos rojos sanos, los cuales transportan el oxgeno y los nutrientes hacia todo el cuerpo. Este medicamento se [place] para tratar la anemia por falta de hierro a las personas con enfermedad renal crnica. Este medicamento puede ser utilizado para otros usos; si tiene alguna pregunta consulte con su proveedor de atencin mdica o con su farmacutico. Qu le debo informar a mi profesional de la salud antes de tomar este medicamento? Necesita saber si usted presenta alguno de los [employer] o situaciones: -anemia no provocada por niveles bajos de hierro -niveles altos de hierro en la sangre -examen de imgenes por Health visitor (IRM) programado -una reaccin alrgica o inusual al hierro, otros medicamentos, alimentos, colorantes o conservantes -si est embarazada o buscando quedar embarazada -si est amamantando a un beb Cmo debo Insurance account manager medicamento? Este medicamento se administra mediante inyeccin por va intravenosa. Lo administra un profesional de Technical sales engineer en un hospital o en un entorno clnico. Hable con su pediatra para informarse acerca del uso de este medicamento en nios. Puede requerir atencin especial. Sobredosis: Pngase en contacto inmediatamente con un centro toxicolgico o una sala de urgencia si usted cree que haya tomado demasiado medicamento. ATENCIN: [employer] es solo para usted. No comparta este medicamento con nadie. Qu sucede si me olvido de una dosis? Es importante no olvidar ninguna dosis. Informe a su mdico o a su profesional de la salud si no puede asistir a Photographer. Qu puede interactuar con este medicamento? Esta medicina puede interactuar con los siguientes medicamentos: -otros productos de hierro Puede ser que esta lista no menciona todas las posibles interacciones. Informe a su profesional de  [employer]	[place] de [employer] productos a base de hierbas, medicamentos de [place] o suplementos nutritivos que est tomando. Si usted fuma, consume bebidas alcohlicas o si utiliza drogas ilegales, indqueselo tambin a su profesional de [employer]	[place]. Algunas sustancias pueden interactuar con su medicamento. A qu debo estar atento al usar [employer]? Visite a su mdico o a su profesional de la salud de [place] regular. Si los sntomas no comienzan a mejorar o si empeoran, consulte con su mdico o con su profesional de [employer]	[place]. Tal vez necesita realizarse anlisis de sangre mientras recibe [place]. Tal vez necesita seguir Counselling psychologist. Consulte a su mdico. Los alimentos que contienen hierro incluyen: alimentos integrales o con cereales, frutas secas, frijoles o arvejas, vegetales de hoja verde y carne que proviene de rganos (hgado, rin). Qu efectos secundarios puedo tener al [employer] este medicamento? Efectos secundarios que debe informar a su mdico o a Barrister's clerk de la salud tan pronto como sea posible: -Chief of Staff como erupcin cutnea, picazn o urticarias, hinchazn de la cara, labios o lengua -problemas respiratorios -cambios en la presin sangunea -sensacin de desmayos o mareos, cadas -fiebre o escalofros -enrojecimiento, sudoracin o sensacin de calor -hinchazn de los tobillos o pies Efectos secundarios que, por lo general, no requieren atencin mdica (debe informarlos a su mdico o a su profesional de la salud si persisten o si son molestos): -diarrea -dolor de cabeza -nuseas, vmito -Higher education careers adviser Puede ser que esta lista no menciona todos los posibles efectos secundarios. Comunquese a su mdico por asesoramiento mdico [employer] Usted puede informar los efectos secundarios a la FDA por telfono al 1-800-FDA-1088. Dnde debo guardar mi medicina? Este medicamento se administra en hospitales o clnicas  y no necesitar  guardarlo en su domicilio. ATENCIN: Este folleto es un resumen. Puede ser que no cubra toda la posible informacin. Si usted tiene preguntas acerca de esta medicina, consulte con su mdico, su farmacutico o su profesional de Technical sales engineer.    2016, [employer]. (2014-05-01 00:00:00)

## 2015-05-23 NOTE — Telephone Encounter (Signed)
Called pt's wife re: missed appt today. She reports she was confused about the appointment. Thought he didn't need it since they did not get a reminder call. They can come in this afternoon for appointment. Given 1PM for lab/ 1:30 infusion.

## 2015-05-25 ENCOUNTER — Other Ambulatory Visit: Payer: Self-pay | Admitting: Oncology

## 2015-05-29 ENCOUNTER — Other Ambulatory Visit: Payer: Self-pay | Admitting: *Deleted

## 2015-05-29 ENCOUNTER — Telehealth: Payer: Self-pay | Admitting: Nurse Practitioner

## 2015-05-29 NOTE — Telephone Encounter (Signed)
cld and talkwed w/wife and gave appt time @10 :15 lab added appt

## 2015-05-30 ENCOUNTER — Other Ambulatory Visit: Payer: Self-pay | Admitting: Nurse Practitioner

## 2015-05-30 ENCOUNTER — Other Ambulatory Visit (HOSPITAL_BASED_OUTPATIENT_CLINIC_OR_DEPARTMENT_OTHER): Payer: Medicare Other

## 2015-05-30 ENCOUNTER — Other Ambulatory Visit: Payer: Self-pay | Admitting: Oncology

## 2015-05-30 ENCOUNTER — Telehealth: Payer: Self-pay | Admitting: Oncology

## 2015-05-30 ENCOUNTER — Ambulatory Visit (HOSPITAL_BASED_OUTPATIENT_CLINIC_OR_DEPARTMENT_OTHER): Payer: Medicare Other | Admitting: Nurse Practitioner

## 2015-05-30 ENCOUNTER — Ambulatory Visit (HOSPITAL_BASED_OUTPATIENT_CLINIC_OR_DEPARTMENT_OTHER): Payer: Medicare Other

## 2015-05-30 VITALS — BP 146/84 | HR 72 | Temp 97.9°F | Resp 17 | Ht 64.5 in | Wt 151.3 lb

## 2015-05-30 DIAGNOSIS — Z5112 Encounter for antineoplastic immunotherapy: Secondary | ICD-10-CM

## 2015-05-30 DIAGNOSIS — C169 Malignant neoplasm of stomach, unspecified: Secondary | ICD-10-CM

## 2015-05-30 DIAGNOSIS — C162 Malignant neoplasm of body of stomach: Secondary | ICD-10-CM | POA: Diagnosis not present

## 2015-05-30 DIAGNOSIS — Z5111 Encounter for antineoplastic chemotherapy: Secondary | ICD-10-CM

## 2015-05-30 DIAGNOSIS — D509 Iron deficiency anemia, unspecified: Secondary | ICD-10-CM | POA: Diagnosis not present

## 2015-05-30 DIAGNOSIS — C168 Malignant neoplasm of overlapping sites of stomach: Secondary | ICD-10-CM

## 2015-05-30 DIAGNOSIS — C786 Secondary malignant neoplasm of retroperitoneum and peritoneum: Secondary | ICD-10-CM | POA: Diagnosis not present

## 2015-05-30 LAB — CBC WITH DIFFERENTIAL/PLATELET
BASO%: 1.8 % (ref 0.0–2.0)
BASOS ABS: 0.1 10*3/uL (ref 0.0–0.1)
EOS%: 1.4 % (ref 0.0–7.0)
Eosinophils Absolute: 0.1 10*3/uL (ref 0.0–0.5)
HCT: 36.1 % — ABNORMAL LOW (ref 38.4–49.9)
HEMOGLOBIN: 11.2 g/dL — AB (ref 13.0–17.1)
LYMPH%: 25.9 % (ref 14.0–49.0)
MCH: 25.8 pg — AB (ref 27.2–33.4)
MCHC: 31.1 g/dL — AB (ref 32.0–36.0)
MCV: 83.1 fL (ref 79.3–98.0)
MONO#: 0.8 10*3/uL (ref 0.1–0.9)
MONO%: 15 % — AB (ref 0.0–14.0)
NEUT#: 3 10*3/uL (ref 1.5–6.5)
NEUT%: 55.9 % (ref 39.0–75.0)
Platelets: 192 10*3/uL (ref 140–400)
RBC: 4.35 10*6/uL (ref 4.20–5.82)
RDW: 32 % — AB (ref 11.0–14.6)
WBC: 5.3 10*3/uL (ref 4.0–10.3)
lymph#: 1.4 10*3/uL (ref 0.9–3.3)

## 2015-05-30 LAB — COMPREHENSIVE METABOLIC PANEL
ALBUMIN: 3.7 g/dL (ref 3.5–5.0)
ALT: 10 U/L (ref 0–55)
AST: 16 U/L (ref 5–34)
Alkaline Phosphatase: 68 U/L (ref 40–150)
Anion Gap: 6 mEq/L (ref 3–11)
BUN: 7.6 mg/dL (ref 7.0–26.0)
CHLORIDE: 108 meq/L (ref 98–109)
CO2: 27 mEq/L (ref 22–29)
Calcium: 8.6 mg/dL (ref 8.4–10.4)
Creatinine: 0.8 mg/dL (ref 0.7–1.3)
EGFR: >90 mL/min/{1.73_m2} (ref >90)
GLUCOSE: 101 mg/dL (ref 70–140)
POTASSIUM: 4.2 meq/L (ref 3.5–5.1)
SODIUM: 141 meq/L (ref 136–145)
Total Bilirubin: 0.4 mg/dL (ref 0.20–1.20)
Total Protein: 7 g/dL (ref 6.4–8.3)

## 2015-05-30 MED ORDER — HEPARIN SOD (PORK) LOCK FLUSH 100 UNIT/ML IV SOLN
500.0000 [IU] | Freq: Once | INTRAVENOUS | Status: AC | PRN
  Administered 2015-05-30: 500 [IU]
  Filled 2015-05-30: qty 5

## 2015-05-30 MED ORDER — RAMUCIRUMAB CHEMO INJECTION 500 MG/50ML
8.0000 mg/kg | Freq: Once | INTRAVENOUS | Status: AC
  Administered 2015-05-30: 500 mg via INTRAVENOUS
  Filled 2015-05-30: qty 50

## 2015-05-30 MED ORDER — SODIUM CHLORIDE 0.9 % IV SOLN
Freq: Once | INTRAVENOUS | Status: AC
  Administered 2015-05-30: 13:00:00 via INTRAVENOUS
  Filled 2015-05-30: qty 4

## 2015-05-30 MED ORDER — DIPHENHYDRAMINE HCL 50 MG/ML IJ SOLN
INTRAMUSCULAR | Status: AC
  Filled 2015-05-30: qty 1

## 2015-05-30 MED ORDER — PACLITAXEL CHEMO INJECTION 300 MG/50ML
80.0000 mg/m2 | Freq: Once | INTRAVENOUS | Status: AC
  Administered 2015-05-30: 138 mg via INTRAVENOUS
  Filled 2015-05-30: qty 23

## 2015-05-30 MED ORDER — FAMOTIDINE IN NACL 20-0.9 MG/50ML-% IV SOLN
20.0000 mg | Freq: Once | INTRAVENOUS | Status: AC
  Administered 2015-05-30: 20 mg via INTRAVENOUS

## 2015-05-30 MED ORDER — ACETAMINOPHEN 325 MG PO TABS
650.0000 mg | ORAL_TABLET | Freq: Once | ORAL | Status: AC
  Administered 2015-05-30: 650 mg via ORAL

## 2015-05-30 MED ORDER — DIPHENHYDRAMINE HCL 50 MG/ML IJ SOLN
25.0000 mg | Freq: Once | INTRAMUSCULAR | Status: AC
Start: 2015-05-30 — End: 2015-05-30
  Administered 2015-05-30: 25 mg via INTRAVENOUS

## 2015-05-30 MED ORDER — ACETAMINOPHEN 325 MG PO TABS
ORAL_TABLET | ORAL | Status: AC
  Filled 2015-05-30: qty 2

## 2015-05-30 MED ORDER — SODIUM CHLORIDE 0.9 % IJ SOLN
10.0000 mL | INTRAMUSCULAR | Status: DC | PRN
  Administered 2015-05-30: 10 mL
  Filled 2015-05-30: qty 10

## 2015-05-30 MED ORDER — FAMOTIDINE IN NACL 20-0.9 MG/50ML-% IV SOLN
INTRAVENOUS | Status: AC
  Filled 2015-05-30: qty 50

## 2015-05-30 MED ORDER — SODIUM CHLORIDE 0.9 % IV SOLN
Freq: Once | INTRAVENOUS | Status: AC
  Administered 2015-05-30: 12:00:00 via INTRAVENOUS

## 2015-05-30 NOTE — Progress Notes (Signed)
[facility] OFFICE PROGRESS NOTE   Diagnosis:  Gastric cancer  INTERVAL HISTORY:   Mr. Dansereau returns as scheduled. He continues Taxol/ramucirumab. He received IV iron 05/16/2015 and 05/23/2015. He notes that he feels much better. No nausea or vomiting. He has had recent mild upper abdominal discomfort. The discomfort "comes and goes". He notes improvement in the discomfort with Alka-Seltzer. Bowels moving regularly. He continues to have a good appetite. No numbness or tingling in his hands or feet. No rash. He noted that the lower legs became itchy during the last chemotherapy infusion.  Objective:  Vital signs in last 24 hours:  Blood pressure 146/84, pulse 72, temperature 97.9 F (36.6 C), temperature source Oral, resp. rate 17, height 5' 4.5" (1.638 m), weight 151 lb 4.8 oz (68.629 kg), SpO2 100 %.    HEENT: No thrush or ulcers. Resp: Lungs clear bilaterally. Cardio: Regular rate and rhythm. GI: Abdomen soft and nontender. No hepatomegaly. No mass. Vascular: No leg edema. Calves soft and nontender. Skin: No rash. Specifically no rash on the legs. Port-A-Cath without erythema.    Lab Results:  Lab Results  Component Value Date   WBC 5.3 05/30/2015   HGB 11.2* 05/30/2015   HCT 36.1* 05/30/2015   MCV 83.1 05/30/2015   PLT 192 05/30/2015   NEUTROABS 3.0 05/30/2015    Imaging:  No results found.  Medications: I have reviewed the patient's current medications.  Assessment/Plan: 1.Metastatic gastric cancer-status post biopsy of a gastric mass on 07/20/2012 with the pathology confirming adenocarcinoma, HER-2 non-amplified.  - CT 07/18/2012 consistent with a primary gastric mass and metastatic omental/peritoneal nodules  -biopsy of a peritoneal nodule 07/31/2012 consistent with metastatic adenocarcinoma  -Cycle 1 of FOLFOX on 08/02/2012  -Restaging CT 10/23/2012 consistent with a partial response-decrease in the primary gastric tumor and  omental/peritoneal implants  -cycle 10 of FOLFOX completed 01/11/2013  -normal CEA 01/11/2013  -Restaging CT 01/23/2013 with improvement in the peritoneal metastases and an increased size of an indeterminate splenic lesion  -Initiation of maintenance Xeloda therapy on a 7 day on/7 day off schedule 01/29/2013.  -Xeloda dose reduced to 1000 mg twice daily 7 days on/7 days off beginning 02/26/2013 due to nausea and diarrhea.  -Xeloda dose reduced to 500 mg twice daily, 7 days/14 days off 03/20/2013.  -Xeloda discontinued 04/25/2013 due to progressive hand foot syndrome.  -CEA elevated at 9.1 on 04/25/2013.  -CEA stable at 9.5 on 05/09/2013.  -Initiation of infusional 5-FU as per the FOLFOX regimen 05/09/2013.  -CEA 7.4 on 05/24/2013.  -CEA 7.9 on 06/21/2013.  -CEA 9.3 on 08/22/2013 .  -CEA 11.1 on 10/01/2013.  -Restaging CT 10/01/2013 with persistent asymmetric gastric wall thickening in the greater curvature of the mid stomach. Dominant index gastrocolic lymph node decreased in size. Other smaller perigastric lymph nodes stable. No new or progressive findings in or around the stomach. Low-density subcapsular splenic lesion resolved.  -Continuation of infusional 5-fluorouracil, switched to a 4 week schedule beginning 01/02/2014 -CT 03/26/2014 with enlargement of the gastric mass and a possible new liver lesion -FOLFOX chemotherapy resumed 03/27/2014 -Restaging CT scan 06/03/2014 with mild decrease in volume of gastric wall mass. Decrease in volume of perigastric metastatic lymph node. Stable adrenal nodularity. No evidence of disease progression. -Cycle 6 FOLFOX 06/05/2014 - cycle 7 FOLFOX 06/19/2014 -Cycle 8 FOLFOX 07/03/2014 -Cycle 9 FOLFOX 07/17/2014 -Cycle 10 FOLFOX 07/31/2014 -Restaging CT 08/20/2014 revealed stable gastric wall thickening and a slight decrease in the size of a tumor nodule anterior to  the stomach -Cycle 11 FOLFOX 08/28/2014 -Restaging CT evaluation  01/28/2015 with local progression of the primary gastric tumor along the greater curvature in the proximal body of the stomach. Progressive peritoneal tumor implants adjacent to the splenic hilum and in the gastrocolic ligament. New gastrohepatic ligament lymphadenopathy. New 2.9 cm right liver lobe mass. -Cycle 1 Taxol/cyramza beginning 02/07/2015 -Cycle 2 Taxol/cyramza beginning 03/14/2015 -Cycle 3 Taxol/cyramza beginning 04/25/2015 -Cycle 4 Taxol/cyramza beginning 05/30/2015  3. Multiple colon polyps documented on a colonoscopy 06/22/2012-tubular adenomas, tubulovillous adenomas, and hyperplastic polyps  4. Family history of colon cancer (mother). Status post genetics evaluation. Negative genetic testing on the Lynch/High Risk colon panel test  5. Pain secondary to the primary gastric mass or carcinomatosis -resolved  6. 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 Enrique Sack, multiple extractions were recommended  7. Anorexia, nausea, and early satiety-improved  8. 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 Nausea following chemotherapy-improved with the addition of prophylactic Decadron  10. Skin rash-most likely secondary to Decadron, improved  11. History of neutropenia secondary to chemotherapy  12. 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 Nausea and diarrhea while treated with Xeloda. Improved.  15. Hand-foot syndrome 03/07/2013 manifesting with erythema, dry desquamation and pain on the palms and erythema and pain on the soles. Progressive hand pain 04/25/2013. Xeloda discontinued.  16. Anemia-microcytic; chronic, stable. Ferritin low at 6 11/21/2013. IV iron 05/16/2015 and 05/23/2015. 17. Delayed nausea following cycle 1 FOLFOX. Aloxi added  beginning with cycle 2 04/10/2014.  18. Rash and pruritus during the oxaliplatin infusion with cycle 3 FOLFOX, improved with Benadryl, Solu-Medrol, and Pepcid, no associated symptoms, no rash with cycle 4 FOLFOX 05/08/2014 19. Hives during the oxaliplatin infusion with cycle 11. Resolved with Benadryl. Infusion was completed.  Disposition: Mr. Mochizuki appears stable. He has completed 3 cycles of Taxol/cyramza. Plan to proceed with cycle 4 beginning today as scheduled. The plan is for restaging CT scans following completion of cycle 4.   Hemoglobin has improved significantly since the iron infusions.  He will return for cycle 4 week 2 on 06/06/2015 and week 3 on 06/13/2015. Per his request the CT scan will be scheduled 06/17/2015. He will return for a follow-up visit with Dr. Benay Spice on 06/20/2015 to review the results. He will contact the office in the interim with any problems.  Plan reviewed with Dr. Benay Spice.  Ned Card ANP/GNP-BC   05/30/2015  10:30 AM

## 2015-05-30 NOTE — Patient Instructions (Signed)
[facility] Discharge Instructions for Patients Receiving Chemotherapy  Today you received the following chemotherapy agents Cyramza/Taxol  To help prevent nausea and vomiting after your treatment, we encourage you to take your nausea medication as prescribed. If you develop nausea and vomiting that is not controlled by your nausea medication, call the clinic.   BELOW ARE SYMPTOMS THAT SHOULD BE REPORTED IMMEDIATELY:  *FEVER GREATER THAN 100.5 F  *CHILLS WITH OR WITHOUT FEVER  NAUSEA AND VOMITING THAT IS NOT CONTROLLED WITH YOUR NAUSEA MEDICATION  *UNUSUAL SHORTNESS OF BREATH  *UNUSUAL BRUISING OR BLEEDING  TENDERNESS IN MOUTH AND THROAT WITH OR WITHOUT PRESENCE OF ULCERS  *URINARY PROBLEMS  *BOWEL PROBLEMS  UNUSUAL RASH Items with * indicate a potential emergency and should be followed up as soon as possible.  Feel free to call the clinic you have any questions or concerns. The clinic phone number is [phone].  Please show the [facility] at check-in to the Emergency Department and triage nurse.

## 2015-05-30 NOTE — Telephone Encounter (Signed)
Gave and printed appt sched and avs for pt for march...gv barium

## 2015-05-31 LAB — CEA: CEA1: 38.3 ng/mL — AB (ref 0.0–4.7)

## 2015-06-06 ENCOUNTER — Other Ambulatory Visit (HOSPITAL_BASED_OUTPATIENT_CLINIC_OR_DEPARTMENT_OTHER): Payer: Medicare Other

## 2015-06-06 ENCOUNTER — Ambulatory Visit (HOSPITAL_BASED_OUTPATIENT_CLINIC_OR_DEPARTMENT_OTHER): Payer: Medicare Other

## 2015-06-06 VITALS — BP 136/80 | HR 76 | Temp 97.7°F

## 2015-06-06 DIAGNOSIS — C162 Malignant neoplasm of body of stomach: Secondary | ICD-10-CM | POA: Diagnosis not present

## 2015-06-06 DIAGNOSIS — Z5111 Encounter for antineoplastic chemotherapy: Secondary | ICD-10-CM | POA: Diagnosis not present

## 2015-06-06 DIAGNOSIS — C169 Malignant neoplasm of stomach, unspecified: Secondary | ICD-10-CM

## 2015-06-06 LAB — COMPREHENSIVE METABOLIC PANEL
ALBUMIN: 3.6 g/dL (ref 3.5–5.0)
ALK PHOS: 55 U/L (ref 40–150)
ALT: 9 U/L (ref 0–55)
ANION GAP: 6 meq/L (ref 3–11)
AST: 15 U/L (ref 5–34)
BUN: 10.8 mg/dL (ref 7.0–26.0)
CALCIUM: 8.7 mg/dL (ref 8.4–10.4)
CHLORIDE: 109 meq/L (ref 98–109)
CO2: 26 mEq/L (ref 22–29)
Creatinine: 0.8 mg/dL (ref 0.7–1.3)
Glucose: 102 mg/dl (ref 70–140)
POTASSIUM: 4 meq/L (ref 3.5–5.1)
Sodium: 141 mEq/L (ref 136–145)
Total Bilirubin: 0.46 mg/dL (ref 0.20–1.20)
Total Protein: 6.7 g/dL (ref 6.4–8.3)

## 2015-06-06 LAB — CBC WITH DIFFERENTIAL/PLATELET
BASO%: 1.7 % (ref 0.0–2.0)
BASOS ABS: 0.1 10*3/uL (ref 0.0–0.1)
EOS ABS: 0.1 10*3/uL (ref 0.0–0.5)
EOS%: 2.2 % (ref 0.0–7.0)
HCT: 36.1 % — ABNORMAL LOW (ref 38.4–49.9)
HEMOGLOBIN: 11.2 g/dL — AB (ref 13.0–17.1)
LYMPH#: 1.2 10*3/uL (ref 0.9–3.3)
LYMPH%: 30.2 % (ref 14.0–49.0)
MCH: 26.6 pg — AB (ref 27.2–33.4)
MCHC: 31 g/dL — ABNORMAL LOW (ref 32.0–36.0)
MCV: 85.7 fL (ref 79.3–98.0)
MONO#: 0.2 10*3/uL (ref 0.1–0.9)
MONO%: 3.6 % (ref 0.0–14.0)
NEUT#: 2.6 10*3/uL (ref 1.5–6.5)
NEUT%: 62.3 % (ref 39.0–75.0)
NRBC: 0 % (ref 0–0)
RBC: 4.21 10*6/uL (ref 4.20–5.82)
RDW: 25.6 % — AB (ref 11.0–14.6)
WBC: 4.1 10*3/uL (ref 4.0–10.3)

## 2015-06-06 MED ORDER — SODIUM CHLORIDE 0.9 % IV SOLN
Freq: Once | INTRAVENOUS | Status: AC
  Administered 2015-06-06: 10:00:00 via INTRAVENOUS

## 2015-06-06 MED ORDER — PACLITAXEL CHEMO INJECTION 300 MG/50ML
80.0000 mg/m2 | Freq: Once | INTRAVENOUS | Status: AC
  Administered 2015-06-06: 138 mg via INTRAVENOUS
  Filled 2015-06-06: qty 23

## 2015-06-06 MED ORDER — SODIUM CHLORIDE 0.9 % IV SOLN
Freq: Once | INTRAVENOUS | Status: AC
  Administered 2015-06-06: 11:00:00 via INTRAVENOUS
  Filled 2015-06-06: qty 4

## 2015-06-06 MED ORDER — FAMOTIDINE IN NACL 20-0.9 MG/50ML-% IV SOLN
20.0000 mg | Freq: Once | INTRAVENOUS | Status: AC
  Administered 2015-06-06: 20 mg via INTRAVENOUS

## 2015-06-06 MED ORDER — SODIUM CHLORIDE 0.9 % IJ SOLN
10.0000 mL | INTRAMUSCULAR | Status: DC | PRN
  Administered 2015-06-06: 10 mL
  Filled 2015-06-06: qty 10

## 2015-06-06 MED ORDER — FAMOTIDINE IN NACL 20-0.9 MG/50ML-% IV SOLN
INTRAVENOUS | Status: AC
  Filled 2015-06-06: qty 50

## 2015-06-06 MED ORDER — HEPARIN SOD (PORK) LOCK FLUSH 100 UNIT/ML IV SOLN
500.0000 [IU] | Freq: Once | INTRAVENOUS | Status: AC | PRN
  Administered 2015-06-06: 500 [IU]
  Filled 2015-06-06: qty 5

## 2015-06-06 MED ORDER — DIPHENHYDRAMINE HCL 50 MG/ML IJ SOLN
25.0000 mg | Freq: Once | INTRAMUSCULAR | Status: AC
  Administered 2015-06-06: 25 mg via INTRAVENOUS

## 2015-06-06 MED ORDER — DIPHENHYDRAMINE HCL 50 MG/ML IJ SOLN
INTRAMUSCULAR | Status: AC
  Filled 2015-06-06: qty 1

## 2015-06-06 NOTE — Patient Instructions (Signed)
[facility] Discharge Instructions for Patients Receiving Chemotherapy  Today you received the following chemotherapy agents:  Taxol  To help prevent nausea and vomiting after your treatment, we encourage you to take your nausea medication as prescribed.   If you develop nausea and vomiting that is not controlled by your nausea medication, call the clinic.   BELOW ARE SYMPTOMS THAT SHOULD BE REPORTED IMMEDIATELY:  *FEVER GREATER THAN 100.5 F  *CHILLS WITH OR WITHOUT FEVER  NAUSEA AND VOMITING THAT IS NOT CONTROLLED WITH YOUR NAUSEA MEDICATION  *UNUSUAL SHORTNESS OF BREATH  *UNUSUAL BRUISING OR BLEEDING  TENDERNESS IN MOUTH AND THROAT WITH OR WITHOUT PRESENCE OF ULCERS  *URINARY PROBLEMS  *BOWEL PROBLEMS  UNUSUAL RASH Items with * indicate a potential emergency and should be followed up as soon as possible.  Feel free to call the clinic you have any questions or concerns. The clinic phone number is [phone].  Please show the CHEMO ALERT CARD at check-in to the Emergency Department and triage nurse.   

## 2015-06-17 ENCOUNTER — Encounter (HOSPITAL_COMMUNITY): Payer: Self-pay

## 2015-06-17 ENCOUNTER — Ambulatory Visit (HOSPITAL_COMMUNITY)
Admission: RE | Admit: 2015-06-17 | Discharge: 2015-06-17 | Disposition: A | Discharge status: Home / Self Care | Payer: Medicare Other | Source: Ambulatory Visit | Attending: Nurse Practitioner | Admitting: Nurse Practitioner

## 2015-06-17 DIAGNOSIS — C169 Malignant neoplasm of stomach, unspecified: Secondary | ICD-10-CM | POA: Diagnosis present

## 2015-06-17 DIAGNOSIS — R590 Localized enlarged lymph nodes: Secondary | ICD-10-CM | POA: Diagnosis not present

## 2015-06-17 DIAGNOSIS — D7389 Other diseases of spleen: Secondary | ICD-10-CM | POA: Insufficient documentation

## 2015-06-17 DIAGNOSIS — Z923 Personal history of irradiation: Secondary | ICD-10-CM | POA: Insufficient documentation

## 2015-06-17 DIAGNOSIS — C787 Secondary malignant neoplasm of liver and intrahepatic bile duct: Secondary | ICD-10-CM | POA: Diagnosis not present

## 2015-06-17 MED ORDER — IOPAMIDOL (ISOVUE-300) INJECTION 61%
100.0000 mL | Freq: Once | INTRAVENOUS | Status: AC | PRN
  Administered 2015-06-17: 100 mL via INTRAVENOUS

## 2015-06-20 ENCOUNTER — Other Ambulatory Visit (HOSPITAL_BASED_OUTPATIENT_CLINIC_OR_DEPARTMENT_OTHER): Payer: Medicare Other

## 2015-06-20 ENCOUNTER — Telehealth: Payer: Self-pay | Admitting: Oncology

## 2015-06-20 ENCOUNTER — Ambulatory Visit (HOSPITAL_BASED_OUTPATIENT_CLINIC_OR_DEPARTMENT_OTHER): Payer: Medicare Other | Admitting: Oncology

## 2015-06-20 ENCOUNTER — Ambulatory Visit: Payer: Medicare Other

## 2015-06-20 VITALS — BP 136/86 | HR 74 | Temp 98.1°F | Resp 18 | Ht 64.5 in | Wt 151.5 lb

## 2015-06-20 DIAGNOSIS — C169 Malignant neoplasm of stomach, unspecified: Secondary | ICD-10-CM

## 2015-06-20 DIAGNOSIS — C786 Secondary malignant neoplasm of retroperitoneum and peritoneum: Secondary | ICD-10-CM

## 2015-06-20 DIAGNOSIS — D509 Iron deficiency anemia, unspecified: Secondary | ICD-10-CM | POA: Diagnosis not present

## 2015-06-20 LAB — COMPREHENSIVE METABOLIC PANEL
ALT: 9 U/L (ref 0–55)
AST: 16 U/L (ref 5–34)
Albumin: 3.4 g/dL — ABNORMAL LOW (ref 3.5–5.0)
Alkaline Phosphatase: 55 U/L (ref 40–150)
Anion Gap: 7 mEq/L (ref 3–11)
BUN: 10.6 mg/dL (ref 7.0–26.0)
CHLORIDE: 109 meq/L (ref 98–109)
CO2: 25 mEq/L (ref 22–29)
Calcium: 8.6 mg/dL (ref 8.4–10.4)
Creatinine: 0.9 mg/dL (ref 0.7–1.3)
GLUCOSE: 121 mg/dL (ref 70–140)
POTASSIUM: 4.2 meq/L (ref 3.5–5.1)
SODIUM: 141 meq/L (ref 136–145)
Total Bilirubin: 0.36 mg/dL (ref 0.20–1.20)
Total Protein: 6.4 g/dL (ref 6.4–8.3)

## 2015-06-20 LAB — CBC WITH DIFFERENTIAL/PLATELET
BASO%: 1.5 % (ref 0.0–2.0)
BASOS ABS: 0 10*3/uL (ref 0.0–0.1)
EOS%: 3.2 % (ref 0.0–7.0)
Eosinophils Absolute: 0.1 10*3/uL (ref 0.0–0.5)
HCT: 35.8 % — ABNORMAL LOW (ref 38.4–49.9)
HGB: 11.3 g/dL — ABNORMAL LOW (ref 13.0–17.1)
LYMPH%: 34.9 % (ref 14.0–49.0)
MCH: 27.5 pg (ref 27.2–33.4)
MCHC: 31.7 g/dL — AB (ref 32.0–36.0)
MCV: 86.6 fL (ref 79.3–98.0)
MONO#: 0.4 10*3/uL (ref 0.1–0.9)
MONO%: 14.1 % — AB (ref 0.0–14.0)
NEUT#: 1.4 10*3/uL — ABNORMAL LOW (ref 1.5–6.5)
NEUT%: 46.3 % (ref 39.0–75.0)
Platelets: 178 10*3/uL (ref 140–400)
RBC: 4.13 10*6/uL — AB (ref 4.20–5.82)
RDW: 27.4 % — AB (ref 11.0–14.6)
WBC: 3 10*3/uL — ABNORMAL LOW (ref 4.0–10.3)
lymph#: 1.1 10*3/uL (ref 0.9–3.3)

## 2015-06-20 NOTE — Progress Notes (Signed)
[facility] OFFICE PROGRESS NOTE   Diagnosis: Gastric cancer  INTERVAL HISTORY:   Joseph Salinas returns as scheduled. He was last treated with Taxol on 06/06/2015. He feels well. Good appetite. No nausea. No neuropathy symptoms.  Objective:  Vital signs in last 24 hours:  Blood pressure 136/86, pulse 74, temperature 98.1 F (36.7 C), temperature source Oral, resp. rate 18, height 5' 4.5" (1.638 m), weight 151 lb 8 oz (68.72 kg), SpO2 100 %.    HEENT: No ulcers, 2 cm plaque of white discoloration at the anterior right buccal mucosa Resp: Lungs clear bilaterally Cardio: Regular rate and rhythm GI: No hepatomegaly, no mass, no apparent ascites Vascular: No leg edema    Portacath/PICC-without erythema  Lab Results:  Lab Results  Component Value Date   WBC 3.0* 06/20/2015   HGB 11.3* 06/20/2015   HCT 35.8* 06/20/2015   MCV 86.6 06/20/2015   PLT 178 06/20/2015   NEUTROABS 1.4* 06/20/2015    Lab Results  Component Value Date   CEA1 38.3* 05/30/2015    Imaging:  Ct Chest W Contrast  06/17/2015  CLINICAL DATA:  Gastric carcinoma.  Ongoing chemotherapy. EXAM: CT CHEST, ABDOMEN, AND PELVIS WITH CONTRAST TECHNIQUE: Multidetector CT imaging of the chest, abdomen and pelvis was performed following the standard protocol during bolus administration of intravenous contrast. CONTRAST:  169m ISOVUE-300 IOPAMIDOL (ISOVUE-300) INJECTION 61% COMPARISON:  CT 01/28/2015 FINDINGS: CT CHEST CT CHEST FINDINGS Mediastinum/Nodes: Port in the RIGHT chest wall. No axillary supraclavicular adenopathy. No mediastinal hilar lymphadenopathy. No pericardial fluid. Switch. Lungs/Pleura: No suspicious pulmonary nodules.  Airways are normal. Musculoskeletal: No aggressive osseous lesion. CT ABDOMEN AND PELVIS FINDINGS Hepatobiliary: RIGHT hepatic lobe metastasis measures 31 x 42 mm (image 50, series 2) compared to 28 x 29 mm. Rounded lesion in the posterior medial RIGHT hepatic lobe measures  20 mm (image 56, series 2) compared to 19 mm. Lesion most inferior RIGHT hepatic lobe measures 13 mm also unchanged. Additional cystic lesions centrally within the LEFT RIGHT hepatic lobe unchanged. No new hepatic lesion. Pancreas: Mass lesion or involving the tail the pancreas measuring 38 x 23 mm compared to 36 x 30 mm for no significant change in size. Spleen: Normal Adrenals/urinary tract: Adrenal glands and kidneys are normal. The ureters and bladder normal. Stomach/Bowel: Again demonstrated mass along the greater curvature the stomach which is decreased in size measuring roughly measures 7.6 x 4.6 cm (251, series 2) compared to 9.3 x 5.6 cm. This gastric wall mass has extension throat serosal surface with nodular component (image 52, series 2) unchanged. Metastatic lymph nodes the gastrohepatic ligament are similar. For example 13 mm short axis node on image 53, series 2 compares to 14 mm. Vascular/Lymphatic: Abdominal aorta is normal caliber. There is no retroperitoneal or periportal lymphadenopathy. No pelvic lymphadenopathy. Reproductive: Prostate normal Other: No peritoneal or omental metastasis. Musculoskeletal: No aggressive osseous lesion. IMPRESSION: Chest Impression: No thoracic metastasis Abdomen / Pelvis Impression: 1. Interval decrease in volume of gastric mass along the greater curvature. 2. Interval increase in size of solitary hepatic metastasis. 3. Lesion within the gastrosplenic ligament which involves the tail the pancreas is not changed significantly in size. 4. Stable gastrohepatic ligament metastatic adenopathy. Electronically Signed   By: SSuzy BouchardM.D.   On: 06/17/2015 10:46   Ct Abdomen Pelvis W Contrast  06/17/2015  CLINICAL DATA:  Gastric carcinoma.  Ongoing chemotherapy. EXAM: CT CHEST, ABDOMEN, AND PELVIS WITH CONTRAST TECHNIQUE: Multidetector CT imaging of the chest, abdomen and pelvis  was performed following the standard protocol during bolus administration of  intravenous contrast. CONTRAST:  138m ISOVUE-300 IOPAMIDOL (ISOVUE-300) INJECTION 61% COMPARISON:  CT 01/28/2015 FINDINGS: CT CHEST CT CHEST FINDINGS Mediastinum/Nodes: Port in the RIGHT chest wall. No axillary supraclavicular adenopathy. No mediastinal hilar lymphadenopathy. No pericardial fluid. Switch. Lungs/Pleura: No suspicious pulmonary nodules.  Airways are normal. Musculoskeletal: No aggressive osseous lesion. CT ABDOMEN AND PELVIS FINDINGS Hepatobiliary: RIGHT hepatic lobe metastasis measures 31 x 42 mm (image 50, series 2) compared to 28 x 29 mm. Rounded lesion in the posterior medial RIGHT hepatic lobe measures 20 mm (image 56, series 2) compared to 19 mm. Lesion most inferior RIGHT hepatic lobe measures 13 mm also unchanged. Additional cystic lesions centrally within the LEFT RIGHT hepatic lobe unchanged. No new hepatic lesion. Pancreas: Mass lesion or involving the tail the pancreas measuring 38 x 23 mm compared to 36 x 30 mm for no significant change in size. Spleen: Normal Adrenals/urinary tract: Adrenal glands and kidneys are normal. The ureters and bladder normal. Stomach/Bowel: Again demonstrated mass along the greater curvature the stomach which is decreased in size measuring roughly measures 7.6 x 4.6 cm (251, series 2) compared to 9.3 x 5.6 cm. This gastric wall mass has extension throat serosal surface with nodular component (image 52, series 2) unchanged. Metastatic lymph nodes the gastrohepatic ligament are similar. For example 13 mm short axis node on image 53, series 2 compares to 14 mm. Vascular/Lymphatic: Abdominal aorta is normal caliber. There is no retroperitoneal or periportal lymphadenopathy. No pelvic lymphadenopathy. Reproductive: Prostate normal Other: No peritoneal or omental metastasis. Musculoskeletal: No aggressive osseous lesion. IMPRESSION: Chest Impression: No thoracic metastasis Abdomen / Pelvis Impression: 1. Interval decrease in volume of gastric mass along the greater  curvature. 2. Interval increase in size of solitary hepatic metastasis. 3. Lesion within the gastrosplenic ligament which involves the tail the pancreas is not changed significantly in size. 4. Stable gastrohepatic ligament metastatic adenopathy. Electronically Signed   By: SSuzy BouchardM.D.   On: 06/17/2015 10:46   CT images were reviewed with Mr. BGlocknerand his wife  Medications: I have reviewed the patient's current medications.  Assessment/Plan: 1.Metastatic gastric cancer-status post biopsy of a gastric mass on 07/20/2012 with the pathology confirming adenocarcinoma, HER-2 non-amplified.  - CT 07/18/2012 consistent with a primary gastric mass and metastatic omental/peritoneal nodules  -biopsy of a peritoneal nodule 07/31/2012 consistent with metastatic adenocarcinoma  -Cycle 1 of FOLFOX on 08/02/2012  -Restaging CT 10/23/2012 consistent with a partial response-decrease in the primary gastric tumor and omental/peritoneal implants  -cycle 10 of FOLFOX completed 01/11/2013  -normal CEA 01/11/2013  -Restaging CT 01/23/2013 with improvement in the peritoneal metastases and an increased size of an indeterminate splenic lesion  -Initiation of maintenance Xeloda therapy on a 7 day on/7 day off schedule 01/29/2013.  -Xeloda dose reduced to 1000 mg twice daily 7 days on/7 days off beginning 02/26/2013 due to nausea and diarrhea.  -Xeloda dose reduced to 500 mg twice daily, 7 days/14 days off 03/20/2013.  -Xeloda discontinued 04/25/2013 due to progressive hand foot syndrome.  -CEA elevated at 9.1 on 04/25/2013.  -CEA stable at 9.5 on 05/09/2013.  -Initiation of infusional 5-FU as per the FOLFOX regimen 05/09/2013.  -CEA 7.4 on 05/24/2013.  -CEA 7.9 on 06/21/2013.  -CEA 9.3 on 08/22/2013 .  -CEA 11.1 on 10/01/2013.  -Restaging CT 10/01/2013 with persistent asymmetric gastric wall thickening in the greater curvature of the mid stomach. Dominant index gastrocolic lymph node  decreased in  size. Other smaller perigastric lymph nodes stable. No new or progressive findings in or around the stomach. Low-density subcapsular splenic lesion resolved.  -Continuation of infusional 5-fluorouracil, switched to a 4 week schedule beginning 01/02/2014 -CT 03/26/2014 with enlargement of the gastric mass and a possible new liver lesion -FOLFOX chemotherapy resumed 03/27/2014 -Restaging CT scan 06/03/2014 with mild decrease in volume of gastric wall mass. Decrease in volume of perigastric metastatic lymph node. Stable adrenal nodularity. No evidence of disease progression. -Cycle 6 FOLFOX 06/05/2014 - cycle 7 FOLFOX 06/19/2014 -Cycle 8 FOLFOX 07/03/2014 -Cycle 9 FOLFOX 07/17/2014 -Cycle 10 FOLFOX 07/31/2014 -Restaging CT 08/20/2014 revealed stable gastric wall thickening and a slight decrease in the size of a tumor nodule anterior to the stomach -Cycle 11 FOLFOX 08/28/2014 -Restaging CT evaluation 01/28/2015 with local progression of the primary gastric tumor along the greater curvature in the proximal body of the stomach. Progressive peritoneal tumor implants adjacent to the splenic hilum and in the gastrocolic ligament. New gastrohepatic ligament lymphadenopathy. New 2.9 cm right liver lobe mass. -Cycle 1 Taxol/cyramza beginning 02/07/2015 -Cycle 2 Taxol/cyramza beginning 03/14/2015 -Cycle 3 Taxol/cyramza beginning 04/25/2015 -Cycle 4 Taxol/cyramza beginning 05/30/2015 -CTs 06/17/2015 showed a decrease in the gastric mass, increased size of solitary liver lesion -CEA elevated March 2017  3. Multiple colon polyps documented on a colonoscopy 06/22/2012-tubular adenomas, tubulovillous adenomas, and hyperplastic polyps  4. Family history of colon cancer (mother). Status post genetics evaluation. Negative genetic testing on the Lynch/High Risk colon panel test  5. Pain secondary to the primary gastric mass or carcinomatosis -resolved  6. 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 Enrique Sack, multiple extractions were recommended  7. Anorexia, nausea, and early satiety-improved  8. 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 Nausea following chemotherapy-improved with the addition of prophylactic Decadron  10. Skin rash-most likely secondary to Decadron, improved  11. History of neutropenia secondary to chemotherapy  12. 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 Nausea and diarrhea while treated with Xeloda. Improved.  15. Hand-foot syndrome 03/07/2013 manifesting with erythema, dry desquamation and pain on the palms and erythema and pain on the soles. Progressive hand pain 04/25/2013. Xeloda discontinued.  16. Anemia-microcytic; chronic, stable. Ferritin low at 6 11/21/2013. IV iron 05/16/2015 and 05/23/2015. 17. Delayed nausea following cycle 1 FOLFOX. Aloxi added beginning with cycle 2 04/10/2014.  18. Rash and pruritus during the oxaliplatin infusion with cycle 3 FOLFOX, improved with Benadryl, Solu-Medrol, and Pepcid, no associated symptoms, no rash with cycle 4 FOLFOX 05/08/2014 19. Hives during the oxaliplatin infusion with cycle 11. Resolved with Benadryl. Infusion was completed.    Disposition:  He appears stable. He does not have symptoms related to the metastatic gastric cancer at present. I reviewed the CT images with Joseph Salinas. A gastric metastasis has increased in size and the CEA is higher. We decided to discontinue the Taxol/ramucirumab. I will present his case at the GI tumor conference on 06/25/2015. He will return for an office visit in 3 weeks.  Additional systemic treatment options include treatment with carboplatin, irinotecan, and PD1 inhibitors.  Betsy Coder, MD  06/20/2015  9:05 AM

## 2015-06-20 NOTE — Telephone Encounter (Signed)
Gave and printed appt shced and avs for pt for April °

## 2015-06-21 LAB — CEA: CEA: 46.9 ng/mL — ABNORMAL HIGH (ref 0.0–4.7)

## 2015-07-11 ENCOUNTER — Ambulatory Visit (HOSPITAL_BASED_OUTPATIENT_CLINIC_OR_DEPARTMENT_OTHER): Payer: Medicare Other | Admitting: Oncology

## 2015-07-11 ENCOUNTER — Telehealth: Payer: Self-pay | Admitting: Oncology

## 2015-07-11 VITALS — BP 146/87 | HR 72 | Temp 98.4°F | Resp 18 | Ht 64.5 in | Wt 149.8 lb

## 2015-07-11 DIAGNOSIS — K920 Hematemesis: Secondary | ICD-10-CM | POA: Diagnosis not present

## 2015-07-11 DIAGNOSIS — C786 Secondary malignant neoplasm of retroperitoneum and peritoneum: Secondary | ICD-10-CM | POA: Diagnosis not present

## 2015-07-11 DIAGNOSIS — C162 Malignant neoplasm of body of stomach: Secondary | ICD-10-CM

## 2015-07-11 DIAGNOSIS — D509 Iron deficiency anemia, unspecified: Secondary | ICD-10-CM

## 2015-07-11 DIAGNOSIS — C169 Malignant neoplasm of stomach, unspecified: Secondary | ICD-10-CM

## 2015-07-11 MED ORDER — PANTOPRAZOLE SODIUM 40 MG PO TBEC: 40.0000 mg | DELAYED_RELEASE_TABLET | Freq: Two times a day (BID) | ORAL | Status: DC

## 2015-07-11 MED ORDER — HYDROCODONE-ACETAMINOPHEN 5-325 MG PO TABS: 1.0000 | ORAL_TABLET | ORAL | Status: DC | PRN

## 2015-07-11 MED FILL — PANTOPRAZOLE SOD DR 40 MG T: 40 | 30 days supply | Qty: 60 | Fill #0

## 2015-07-11 MED FILL — HYDROCODON-APAP 5-325: 5-325 | 5 days supply | Qty: 60 | Fill #0

## 2015-07-11 NOTE — Progress Notes (Signed)
[facility] OFFICE PROGRESS NOTE   Diagnosis: Gastric cancer  INTERVAL HISTORY:   Joseph Salinas returns as scheduled. He reports increased abdominal pain, nausea after eating, and intermittent small volume hematemesis. His bowels are moving. No blood in the stool. He is constipated. He is not taking hydrocodone or a bowel regimen at present.  Objective:  Vital signs in last 24 hours:  Blood pressure 146/87, pulse 72, temperature 98.4 F (36.9 C), temperature source Oral, resp. rate 18, height 5' 4.5" (1.638 m), weight 149 lb 12.8 oz (67.949 kg), SpO2 100 %.    Resp: Lungs clear bilaterally Cardio: Regular rate and rhythm GI: Mild tenderness in the mid abdomen, no mass, no hepatomegaly Vascular: No leg edema   Portacath/PICC-without erythema  Lab Results:  Lab Results  Component Value Date   WBC 3.0* 06/20/2015   HGB 11.3* 06/20/2015   HCT 35.8* 06/20/2015   MCV 86.6 06/20/2015   PLT 178 06/20/2015   NEUTROABS 1.4* 06/20/2015      Lab Results  Component Value Date   CEA1 46.9* 06/20/2015     Medications: I have reviewed the patient's current medications.  Assessment/Plan: 1.Metastatic gastric cancer-status post biopsy of a gastric mass on 07/20/2012 with the pathology confirming adenocarcinoma, HER-2 non-amplified.  - CT 07/18/2012 consistent with a primary gastric mass and metastatic omental/peritoneal nodules  -biopsy of a peritoneal nodule 07/31/2012 consistent with metastatic adenocarcinoma  -Cycle 1 of FOLFOX on 08/02/2012  -Restaging CT 10/23/2012 consistent with a partial response-decrease in the primary gastric tumor and omental/peritoneal implants  -cycle 10 of FOLFOX completed 01/11/2013  -normal CEA 01/11/2013  -Restaging CT 01/23/2013 with improvement in the peritoneal metastases and an increased size of an indeterminate splenic lesion  -Initiation of maintenance Xeloda therapy on a 7 day on/7 day off schedule 01/29/2013.   -Xeloda dose reduced to 1000 mg twice daily 7 days on/7 days off beginning 02/26/2013 due to nausea and diarrhea.  -Xeloda dose reduced to 500 mg twice daily, 7 days/14 days off 03/20/2013.  -Xeloda discontinued 04/25/2013 due to progressive hand foot syndrome.  -CEA elevated at 9.1 on 04/25/2013.  -CEA stable at 9.5 on 05/09/2013.  -Initiation of infusional 5-FU as per the FOLFOX regimen 05/09/2013.  -CEA 7.4 on 05/24/2013.  -CEA 7.9 on 06/21/2013.  -CEA 9.3 on 08/22/2013 .  -CEA 11.1 on 10/01/2013.  -Restaging CT 10/01/2013 with persistent asymmetric gastric wall thickening in the greater curvature of the mid stomach. Dominant index gastrocolic lymph node decreased in size. Other smaller perigastric lymph nodes stable. No new or progressive findings in or around the stomach. Low-density subcapsular splenic lesion resolved.  -Continuation of infusional 5-fluorouracil, switched to a 4 week schedule beginning 01/02/2014 -CT 03/26/2014 with enlargement of the gastric mass and a possible new liver lesion -FOLFOX chemotherapy resumed 03/27/2014 -Restaging CT scan 06/03/2014 with mild decrease in volume of gastric wall mass. Decrease in volume of perigastric metastatic lymph node. Stable adrenal nodularity. No evidence of disease progression. -Cycle 6 FOLFOX 06/05/2014 - cycle 7 FOLFOX 06/19/2014 -Cycle 8 FOLFOX 07/03/2014 -Cycle 9 FOLFOX 07/17/2014 -Cycle 10 FOLFOX 07/31/2014 -Restaging CT 08/20/2014 revealed stable gastric wall thickening and a slight decrease in the size of a tumor nodule anterior to the stomach -Cycle 11 FOLFOX 08/28/2014 -Restaging CT evaluation 01/28/2015 with local progression of the primary gastric tumor along the greater curvature in the proximal body of the stomach. Progressive peritoneal tumor implants adjacent to the splenic hilum and in the gastrocolic ligament. New gastrohepatic ligament lymphadenopathy.  New 2.9 cm right liver lobe mass. -Cycle 1  Taxol/cyramza beginning 02/07/2015 -Cycle 2 Taxol/cyramza beginning 03/14/2015 -Cycle 3 Taxol/cyramza beginning 04/25/2015 -Cycle 4 Taxol/cyramza beginning 05/30/2015 -CTs 06/17/2015 showed a decrease in the gastric mass, increased size of solitary liver lesion -CEA elevated March 2017  3. Multiple colon polyps documented on a colonoscopy 06/22/2012-tubular adenomas, tubulovillous adenomas, and hyperplastic polyps  4. Family history of colon cancer (mother). Status post genetics evaluation. Negative genetic testing on the Lynch/High Risk colon panel test  5. Pain secondary to the primary gastric mass or carcinomatosis -resolved  6. 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 Enrique Sack, multiple extractions were recommended  7. Anorexia, nausea, and early satiety-improved  8. 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 Nausea following chemotherapy-improved with the addition of prophylactic Decadron  10. Skin rash-most likely secondary to Decadron, improved  11. History of neutropenia secondary to chemotherapy  12. 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 Nausea and diarrhea while treated with Xeloda. Improved.  15. Hand-foot syndrome 03/07/2013 manifesting with erythema, dry desquamation and pain on the palms and erythema and pain on the soles. Progressive hand pain 04/25/2013. Xeloda discontinued.  16. Anemia-microcytic; chronic, stable. Ferritin low at 6 11/21/2013. IV iron 05/16/2015 and 05/23/2015. 17. Delayed nausea following cycle 1 FOLFOX. Aloxi added beginning with cycle 2 04/10/2014.  18. Rash and pruritus during the oxaliplatin infusion with cycle 3 FOLFOX, improved with Benadryl, Solu-Medrol, and Pepcid, no associated symptoms, no rash  with cycle 4 FOLFOX 05/08/2014 19. Hives during the oxaliplatin infusion with cycle 11. Resolved with Benadryl. Infusion was completed.   Disposition:  Joseph Salinas now has symptomatic progression of gastric cancer. I discussed treatment options with Joseph Salinas and his wife. We discussed pelvic radiation to the stomach and salvage systemic therapy. We decided to begin FOLFIRI chemotherapy. I reviewed the potential toxicities associated with the FOLFIRI regimen including the chance for acute/delayed diarrhea and alopecia with irinotecan. He agrees to proceed.  Joseph Salinas will return for a first cycle of FOLFIRI on 07/17/2015. He will begin hydrocodone for pain. He will resume Protonix. He will take MiraLAX daily and Senokot twice daily for constipation.  I will refer him to radiation for persistent hematemesis.       Betsy Coder, MD  07/11/2015  10:00 AM

## 2015-07-11 NOTE — Telephone Encounter (Signed)
Gave and printed appt sched and avs for pt for April and May °

## 2015-07-13 ENCOUNTER — Other Ambulatory Visit: Payer: Self-pay | Admitting: Oncology

## 2015-07-14 ENCOUNTER — Encounter: Payer: Self-pay | Admitting: *Deleted

## 2015-07-14 NOTE — Progress Notes (Signed)
Pt.'s wife called stating that she would like a hospice referral for her husband.  She states that he is vomiting after eating and is having pain that is not controlled with his current pain medicine.  Spoke with Dr. Benay Spice and he would like for pt to come in tomorrow, 07/15/15 to be seen.  Pt.'s wife states that she is unable to bring him in tomorrow or Wednesday and the soonest they can get here is Thursday.  Increase in pain medicine offered to pt.'s wife per Dr. Benay Spice, but pt.'s wife denies that pt needs any further pain medication at this time.  Pt.'s wife agreeable to waiting until appt on Thursday.  Pt.'s wife encouraged to call [facility] if pt needs any increase in pain medications prior to Thursday.  She verbalized an understanding to do so and was very appreciative of call.

## 2015-07-17 ENCOUNTER — Ambulatory Visit: Payer: Medicare Other

## 2015-07-17 ENCOUNTER — Telehealth: Payer: Self-pay | Admitting: Nurse Practitioner

## 2015-07-17 ENCOUNTER — Other Ambulatory Visit (HOSPITAL_BASED_OUTPATIENT_CLINIC_OR_DEPARTMENT_OTHER): Payer: Medicare Other

## 2015-07-17 ENCOUNTER — Ambulatory Visit (HOSPITAL_BASED_OUTPATIENT_CLINIC_OR_DEPARTMENT_OTHER): Payer: Medicare Other | Admitting: Nurse Practitioner

## 2015-07-17 VITALS — BP 131/77 | HR 73 | Temp 98.1°F | Resp 18 | Ht 64.5 in | Wt 143.0 lb

## 2015-07-17 DIAGNOSIS — C162 Malignant neoplasm of body of stomach: Secondary | ICD-10-CM

## 2015-07-17 DIAGNOSIS — R1010 Upper abdominal pain, unspecified: Secondary | ICD-10-CM

## 2015-07-17 DIAGNOSIS — C169 Malignant neoplasm of stomach, unspecified: Secondary | ICD-10-CM

## 2015-07-17 DIAGNOSIS — D509 Iron deficiency anemia, unspecified: Secondary | ICD-10-CM | POA: Diagnosis not present

## 2015-07-17 DIAGNOSIS — C786 Secondary malignant neoplasm of retroperitoneum and peritoneum: Secondary | ICD-10-CM

## 2015-07-17 DIAGNOSIS — Z452 Encounter for adjustment and management of vascular access device: Secondary | ICD-10-CM | POA: Diagnosis not present

## 2015-07-17 DIAGNOSIS — C160 Malignant neoplasm of cardia: Secondary | ICD-10-CM

## 2015-07-17 LAB — CBC WITH DIFFERENTIAL/PLATELET
BASO%: 1.2 % (ref 0.0–2.0)
Basophils Absolute: 0.1 10*3/uL (ref 0.0–0.1)
EOS%: 1.9 % (ref 0.0–7.0)
Eosinophils Absolute: 0.1 10*3/uL (ref 0.0–0.5)
HCT: 39.6 % (ref 38.4–49.9)
HEMOGLOBIN: 12.7 g/dL — AB (ref 13.0–17.1)
LYMPH%: 18 % (ref 14.0–49.0)
MCH: 27.8 pg (ref 27.2–33.4)
MCHC: 32.1 g/dL (ref 32.0–36.0)
MCV: 86.3 fL (ref 79.3–98.0)
MONO#: 0.6 10*3/uL (ref 0.1–0.9)
MONO%: 10.7 % (ref 0.0–14.0)
NEUT%: 68.2 % (ref 39.0–75.0)
NEUTROS ABS: 3.9 10*3/uL (ref 1.5–6.5)
Platelets: 176 10*3/uL (ref 140–400)
RBC: 4.59 10*6/uL (ref 4.20–5.82)
RDW: 20.2 % — AB (ref 11.0–14.6)
WBC: 5.7 10*3/uL (ref 4.0–10.3)
lymph#: 1 10*3/uL (ref 0.9–3.3)

## 2015-07-17 LAB — COMPREHENSIVE METABOLIC PANEL
ALT: 12 U/L (ref 0–55)
ANION GAP: 7 meq/L (ref 3–11)
AST: 19 U/L (ref 5–34)
Albumin: 3.7 g/dL (ref 3.5–5.0)
Alkaline Phosphatase: 66 U/L (ref 40–150)
BUN: 10.3 mg/dL (ref 7.0–26.0)
CHLORIDE: 104 meq/L (ref 98–109)
CO2: 27 meq/L (ref 22–29)
CREATININE: 0.9 mg/dL (ref 0.7–1.3)
Calcium: 9.4 mg/dL (ref 8.4–10.4)
Glucose: 88 mg/dl (ref 70–140)
Potassium: 4.5 mEq/L (ref 3.5–5.1)
Sodium: 138 mEq/L (ref 136–145)
Total Bilirubin: 0.42 mg/dL (ref 0.20–1.20)
Total Protein: 6.9 g/dL (ref 6.4–8.3)

## 2015-07-17 MED ORDER — SODIUM CHLORIDE 0.9 % IJ SOLN
10.0000 mL | INTRAMUSCULAR | Status: DC | PRN
  Administered 2015-07-17: 10 mL
  Filled 2015-07-17: qty 10

## 2015-07-17 MED ORDER — PROCHLORPERAZINE MALEATE 10 MG PO TABS: 10.0000 mg | ORAL_TABLET | Freq: Four times a day (QID) | ORAL | Status: AC | PRN

## 2015-07-17 MED ORDER — ONDANSETRON HCL 8 MG PO TABS: 8.0000 mg | ORAL_TABLET | Freq: Three times a day (TID) | ORAL | Status: DC | PRN

## 2015-07-17 MED ORDER — OXYCODONE-ACETAMINOPHEN 5-325 MG PO TABS: 1.0000 | ORAL_TABLET | ORAL | Status: DC | PRN

## 2015-07-17 MED ORDER — HEPARIN SOD (PORK) LOCK FLUSH 100 UNIT/ML IV SOLN
500.0000 [IU] | Freq: Once | INTRAVENOUS | Status: AC | PRN
  Administered 2015-07-17: 500 [IU]
  Filled 2015-07-17: qty 5

## 2015-07-17 MED FILL — PROCHLORPERAZINE 10 MG TAB: 10 | 7 days supply | Qty: 30 | Fill #0

## 2015-07-17 MED FILL — ONDANSETRON HCL 8 MG TABLET: 8 | 10 days supply | Qty: 30 | Fill #0

## 2015-07-17 MED FILL — OXYCODONE/APAP 5/325MG: 5-325 | 7 days supply | Qty: 75 | Fill #0

## 2015-07-17 NOTE — Progress Notes (Addendum)
[place] OFFICE PROGRESS NOTE   Diagnosis:  Gastric cancer  INTERVAL HISTORY:   Joseph Salinas returns as scheduled. He reports persistent upper mid abdominal pain. He rates the pain a 5 or 6. The pain is constant. He takes Vicodin without relief. The pain worsens with eating. He has some constipation. He is taking a laxative regimen. He vomits multiple times a day. He notes bright red blood with the emesis.  Objective:  Vital signs in last 24 hours:  Blood pressure 131/77, pulse 73, temperature 98.1 F (36.7 C), temperature source Oral, resp. rate 18, height 5' 4.5" (1.638 m), weight 143 lb (64.864 kg), SpO2 100 %.    HEENT: No thrush or ulcers. Resp: Lungs clear bilaterally. Cardio: Regular rate and rhythm. GI: Abdomen soft. Mild tenderness mid abdomen. No mass. No organomegaly. Vascular: No leg edema. Port-A-Cath without erythema.   Lab Results:  Lab Results  Component Value Date   WBC 5.7 07/17/2015   HGB 12.7* 07/17/2015   HCT 39.6 07/17/2015   MCV 86.3 07/17/2015   PLT 176 07/17/2015   NEUTROABS 3.9 07/17/2015    Imaging:  No results found.  Medications: I have reviewed the patient's current medications.  Assessment/Plan: 1.Metastatic gastric cancer-status post biopsy of a gastric mass on 07/20/2012 with the pathology confirming adenocarcinoma, HER-2 non-amplified.  - CT 07/18/2012 consistent with a primary gastric mass and metastatic omental/peritoneal nodules  -biopsy of a peritoneal nodule 07/31/2012 consistent with metastatic adenocarcinoma  -Cycle 1 of FOLFOX on 08/02/2012  -Restaging CT 10/23/2012 consistent with a partial response-decrease in the primary gastric tumor and omental/peritoneal implants  -cycle 10 of FOLFOX completed 01/11/2013  -normal CEA 01/11/2013  -Restaging CT 01/23/2013 with improvement in the peritoneal metastases and an increased size of an indeterminate splenic lesion  -Initiation of maintenance Xeloda  therapy on a 7 day on/7 day off schedule 01/29/2013.  -Xeloda dose reduced to 1000 mg twice daily 7 days on/7 days off beginning 02/26/2013 due to nausea and diarrhea.  -Xeloda dose reduced to 500 mg twice daily, 7 days/14 days off 03/20/2013.  -Xeloda discontinued 04/25/2013 due to progressive hand foot syndrome.  -CEA elevated at 9.1 on 04/25/2013.  -CEA stable at 9.5 on 05/09/2013.  -Initiation of infusional 5-FU as per the FOLFOX regimen 05/09/2013.  -CEA 7.4 on 05/24/2013.  -CEA 7.9 on 06/21/2013.  -CEA 9.3 on 08/22/2013 .  -CEA 11.1 on 10/01/2013.  -Restaging CT 10/01/2013 with persistent asymmetric gastric wall thickening in the greater curvature of the mid stomach. Dominant index gastrocolic lymph node decreased in size. Other smaller perigastric lymph nodes stable. No new or progressive findings in or around the stomach. Low-density subcapsular splenic lesion resolved.  -Continuation of infusional 5-fluorouracil, switched to a 4 week schedule beginning 01/02/2014 -CT 03/26/2014 with enlargement of the gastric mass and a possible new liver lesion -FOLFOX chemotherapy resumed 03/27/2014 -Restaging CT scan 06/03/2014 with mild decrease in volume of gastric wall mass. Decrease in volume of perigastric metastatic lymph node. Stable adrenal nodularity. No evidence of disease progression. -Cycle 6 FOLFOX 06/05/2014 - cycle 7 FOLFOX 06/19/2014 -Cycle 8 FOLFOX 07/03/2014 -Cycle 9 FOLFOX 07/17/2014 -Cycle 10 FOLFOX 07/31/2014 -Restaging CT 08/20/2014 revealed stable gastric wall thickening and a slight decrease in the size of a tumor nodule anterior to the stomach -Cycle 11 FOLFOX 08/28/2014 -Restaging CT evaluation 01/28/2015 with local progression of the primary gastric tumor along the greater curvature in the proximal body of the stomach. Progressive peritoneal tumor implants adjacent to the splenic  hilum and in the gastrocolic ligament. New gastrohepatic ligament  lymphadenopathy. New 2.9 cm right liver lobe mass. -Cycle 1 Taxol/cyramza beginning 02/07/2015 -Cycle 2 Taxol/cyramza beginning 03/14/2015 -Cycle 3 Taxol/cyramza beginning 04/25/2015 -Cycle 4 Taxol/cyramza beginning 05/30/2015 -CTs 06/17/2015 showed a decrease in the gastric mass, increased size of solitary liver lesion -CEA elevated March 2017  3. Multiple colon polyps documented on a colonoscopy 06/22/2012-tubular adenomas, tubulovillous adenomas, and hyperplastic polyps  4. Family history of colon cancer (mother). Status post genetics evaluation. Negative genetic testing on the Lynch/High Risk colon panel test  5. Pain secondary to the primary gastric mass or carcinomatosis -resolved  6. 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 Enrique Sack, multiple extractions were recommended  7. Anorexia, nausea, and early satiety-improved  8. 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 Nausea following chemotherapy-improved with the addition of prophylactic Decadron  10. Skin rash-most likely secondary to Decadron, improved  11. History of neutropenia secondary to chemotherapy  12. 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 Nausea and diarrhea while treated with Xeloda. Improved.  15. Hand-foot syndrome 03/07/2013 manifesting with erythema, dry desquamation and pain on the palms and erythema and pain on the soles. Progressive hand pain 04/25/2013. Xeloda discontinued.  16. Anemia-microcytic; chronic, stable. Ferritin low at 6 11/21/2013. IV iron 05/16/2015 and 05/23/2015. 17. Delayed nausea following cycle 1 FOLFOX. Aloxi added beginning with cycle 2 04/10/2014.  18. Rash and pruritus during the oxaliplatin infusion with cycle 3 FOLFOX, improved with Benadryl,  Solu-Medrol, and Pepcid, no associated symptoms, no rash with cycle 4 FOLFOX 05/08/2014 19. Hives during the oxaliplatin infusion with cycle 11. Resolved with Benadryl. Infusion was completed.   Disposition: [name] has metastatic gastric cancer. He is symptomatic with pain and bleeding. He has decided against further chemotherapy. He also declines palliative radiation. He would like to focus on comfort, quality of life. He is agreeable to a hospice referral. We will make a referral to hospice of [place].  We discussed end-of-life issues/codes status. He will be placed on NO CODE BLUE status.   We adjusted his pain medication and gave him a prescription for Percocet 1-2 tablets every 4 hours as needed. He will try Compazine and Zofran for the nausea.  He will return for a follow-up visit in 2 weeks.  Patient seen with Dr. Benay Spice. 25 minutes were spent face-to-face at today's visit with the majority of that time involved in counseling/coordination of care.    Ned Card ANP/GNP-BC   07/17/2015  9:27 AM  This was a shared visit with Ned Card. We discussed treatment options again today with Mr. Quesinberry and his wife. He understands no therapy will be curative. He declines further systemic therapy and radiation. He agrees to Hospice care. We reviewed CPR and ACLS issues. He will be placed on a no CODE BLUE status.  He will contact us for increased hematemesis. We can consider palliative radiation to the stomach if the hematemesis becomes more frequent.  Julieanne Manson, M.D.

## 2015-07-17 NOTE — Telephone Encounter (Signed)
cx tx  4/27 & 5/11 per provider per pof.. Pt requested 5/11 appt be moved to 5/12.Joseph Salinas appt & avs

## 2015-07-17 NOTE — Progress Notes (Signed)
Faxed hospice referral information to [facility].

## 2015-07-17 NOTE — Patient Instructions (Signed)

## 2015-07-18 LAB — CEA: CEA: 90.6 ng/mL — ABNORMAL HIGH (ref 0.0–4.7)

## 2015-07-21 ENCOUNTER — Telehealth: Payer: Self-pay | Admitting: *Deleted

## 2015-07-21 NOTE — Telephone Encounter (Signed)
Call from pt's wife reporting they have not heard from hospice nurses yet. Called hospice, they never received referral. Telephone referral made. They will contact pt today and likely see him tomorrow.

## 2015-07-31 ENCOUNTER — Ambulatory Visit: Payer: Self-pay | Admitting: Nurse Practitioner

## 2015-07-31 ENCOUNTER — Other Ambulatory Visit: Payer: Self-pay

## 2015-07-31 ENCOUNTER — Ambulatory Visit: Payer: Self-pay

## 2015-08-01 ENCOUNTER — Other Ambulatory Visit (HOSPITAL_BASED_OUTPATIENT_CLINIC_OR_DEPARTMENT_OTHER): Payer: Medicare Other

## 2015-08-01 ENCOUNTER — Telehealth: Payer: Self-pay | Admitting: Oncology

## 2015-08-01 ENCOUNTER — Ambulatory Visit (HOSPITAL_BASED_OUTPATIENT_CLINIC_OR_DEPARTMENT_OTHER): Payer: Medicare Other | Admitting: Nurse Practitioner

## 2015-08-01 VITALS — BP 116/75 | HR 79 | Temp 98.4°F | Resp 18 | Wt 143.4 lb

## 2015-08-01 DIAGNOSIS — C162 Malignant neoplasm of body of stomach: Secondary | ICD-10-CM

## 2015-08-01 DIAGNOSIS — D509 Iron deficiency anemia, unspecified: Secondary | ICD-10-CM | POA: Diagnosis not present

## 2015-08-01 DIAGNOSIS — C786 Secondary malignant neoplasm of retroperitoneum and peritoneum: Secondary | ICD-10-CM

## 2015-08-01 DIAGNOSIS — C169 Malignant neoplasm of stomach, unspecified: Secondary | ICD-10-CM

## 2015-08-01 LAB — CBC WITH DIFFERENTIAL/PLATELET
BASO%: 0.7 % (ref 0.0–2.0)
Basophils Absolute: 0 10*3/uL (ref 0.0–0.1)
EOS ABS: 0.1 10*3/uL (ref 0.0–0.5)
EOS%: 1.3 % (ref 0.0–7.0)
HEMATOCRIT: 35.8 % — AB (ref 38.4–49.9)
HEMOGLOBIN: 11.6 g/dL — AB (ref 13.0–17.1)
LYMPH#: 1.2 10*3/uL (ref 0.9–3.3)
LYMPH%: 21.4 % (ref 14.0–49.0)
MCH: 28 pg (ref 27.2–33.4)
MCHC: 32.4 g/dL (ref 32.0–36.0)
MCV: 86.5 fL (ref 79.3–98.0)
MONO#: 0.6 10*3/uL (ref 0.1–0.9)
MONO%: 10.1 % (ref 0.0–14.0)
NEUT%: 66.5 % (ref 39.0–75.0)
NEUTROS ABS: 3.6 10*3/uL (ref 1.5–6.5)
Platelets: 172 10*3/uL (ref 140–400)
RBC: 4.14 10*6/uL — AB (ref 4.20–5.82)
RDW: 16.4 % — AB (ref 11.0–14.6)
WBC: 5.5 10*3/uL (ref 4.0–10.3)

## 2015-08-01 LAB — COMPREHENSIVE METABOLIC PANEL
ALT: 14 U/L (ref 0–55)
AST: 30 U/L (ref 5–34)
Albumin: 3.3 g/dL — ABNORMAL LOW (ref 3.5–5.0)
Alkaline Phosphatase: 74 U/L (ref 40–150)
Anion Gap: 8 mEq/L (ref 3–11)
BUN: 9.3 mg/dL (ref 7.0–26.0)
CO2: 28 meq/L (ref 22–29)
Calcium: 9.3 mg/dL (ref 8.4–10.4)
Chloride: 102 mEq/L (ref 98–109)
Creatinine: 0.9 mg/dL (ref 0.7–1.3)
GLUCOSE: 124 mg/dL (ref 70–140)
POTASSIUM: 4.6 meq/L (ref 3.5–5.1)
SODIUM: 137 meq/L (ref 136–145)
Total Bilirubin: 0.35 mg/dL (ref 0.20–1.20)
Total Protein: 6.8 g/dL (ref 6.4–8.3)

## 2015-08-01 NOTE — Telephone Encounter (Signed)
Gave pt apt & avs °

## 2015-08-01 NOTE — Progress Notes (Addendum)
[place] OFFICE PROGRESS NOTE   Diagnosis:  Gastric cancer  INTERVAL HISTORY:   Mr. Isadore returns as scheduled. He is enrolled in the [facility] hospice program. He continues to have intermittent right-sided abdominal pain and nausea. The pain and nausea tend to occur after eating. He takes oxycodone and an anti-emetic as needed. He intermittently notes blood mixed with emesis. The blood is bright red. His wife estimates 1-2 teaspoons per occurrence. No constipation.  Objective:  Vital signs in last 24 hours:  Blood pressure 116/75, pulse 79, temperature 98.4 F (36.9 C), temperature source Oral, resp. rate 18, weight 143 lb 6.4 oz (65.046 kg), SpO2 100 %.    HEENT: No thrush or ulcers. Lymphatics: No palpable cervical or supraclavicular lymph nodes. Resp: Lungs clear bilaterally. Cardio: Regular rate and rhythm. GI: Abdomen is soft. Mild tenderness right upper abdomen. No hepatomegaly. No mass. Vascular: No leg edema.  Port-A-Cath without erythema.  Lab Results:  Lab Results  Component Value Date   WBC 5.5 08/01/2015   HGB 11.6* 08/01/2015   HCT 35.8* 08/01/2015   MCV 86.5 08/01/2015   PLT 172 08/01/2015   NEUTROABS 3.6 08/01/2015    Imaging:  No results found.  Medications: I have reviewed the patient's current medications.  Assessment/Plan: 1.Metastatic gastric cancer-status post biopsy of a gastric mass on 07/20/2012 with the pathology confirming adenocarcinoma, HER-2 non-amplified.  - CT 07/18/2012 consistent with a primary gastric mass and metastatic omental/peritoneal nodules  -biopsy of a peritoneal nodule 07/31/2012 consistent with metastatic adenocarcinoma  -Cycle 1 of FOLFOX on 08/02/2012  -Restaging CT 10/23/2012 consistent with a partial response-decrease in the primary gastric tumor and omental/peritoneal implants  -cycle 10 of FOLFOX completed 01/11/2013  -normal CEA 01/11/2013  -Restaging CT 01/23/2013 with improvement  in the peritoneal metastases and an increased size of an indeterminate splenic lesion  -Initiation of maintenance Xeloda therapy on a 7 day on/7 day off schedule 01/29/2013.  -Xeloda dose reduced to 1000 mg twice daily 7 days on/7 days off beginning 02/26/2013 due to nausea and diarrhea.  -Xeloda dose reduced to 500 mg twice daily, 7 days/14 days off 03/20/2013.  -Xeloda discontinued 04/25/2013 due to progressive hand foot syndrome.  -CEA elevated at 9.1 on 04/25/2013.  -CEA stable at 9.5 on 05/09/2013.  -Initiation of infusional 5-FU as per the FOLFOX regimen 05/09/2013.  -CEA 7.4 on 05/24/2013.  -CEA 7.9 on 06/21/2013.  -CEA 9.3 on 08/22/2013 .  -CEA 11.1 on 10/01/2013.  -Restaging CT 10/01/2013 with persistent asymmetric gastric wall thickening in the greater curvature of the mid stomach. Dominant index gastrocolic lymph node decreased in size. Other smaller perigastric lymph nodes stable. No new or progressive findings in or around the stomach. Low-density subcapsular splenic lesion resolved.  -Continuation of infusional 5-fluorouracil, switched to a 4 week schedule beginning 01/02/2014 -CT 03/26/2014 with enlargement of the gastric mass and a possible new liver lesion -FOLFOX chemotherapy resumed 03/27/2014 -Restaging CT scan 06/03/2014 with mild decrease in volume of gastric wall mass. Decrease in volume of perigastric metastatic lymph node. Stable adrenal nodularity. No evidence of disease progression. -Cycle 6 FOLFOX 06/05/2014 - cycle 7 FOLFOX 06/19/2014 -Cycle 8 FOLFOX 07/03/2014 -Cycle 9 FOLFOX 07/17/2014 -Cycle 10 FOLFOX 07/31/2014 -Restaging CT 08/20/2014 revealed stable gastric wall thickening and a slight decrease in the size of a tumor nodule anterior to the stomach -Cycle 11 FOLFOX 08/28/2014 -Restaging CT evaluation 01/28/2015 with local progression of the primary gastric tumor along the greater curvature in the proximal body of  the stomach. Progressive  peritoneal tumor implants adjacent to the splenic hilum and in the gastrocolic ligament. New gastrohepatic ligament lymphadenopathy. New 2.9 cm right liver lobe mass. -Cycle 1 Taxol/cyramza beginning 02/07/2015 -Cycle 2 Taxol/cyramza beginning 03/14/2015 -Cycle 3 Taxol/cyramza beginning 04/25/2015 -Cycle 4 Taxol/cyramza beginning 05/30/2015 -CTs 06/17/2015 showed a decrease in the gastric mass, increased size of solitary liver lesion -CEA elevated March 2017  3. Multiple colon polyps documented on a colonoscopy 06/22/2012-tubular adenomas, tubulovillous adenomas, and hyperplastic polyps  4. Family history of colon cancer (mother). Status post genetics evaluation. Negative genetic testing on the Lynch/High Risk colon panel test  5. Pain secondary to the primary gastric mass or carcinomatosis -resolved  6. 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 Enrique Sack, multiple extractions were recommended  7. Anorexia, nausea, and early satiety-improved  8. 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 Nausea following chemotherapy-improved with the addition of prophylactic Decadron  10. Skin rash-most likely secondary to Decadron, improved  11. History of neutropenia secondary to chemotherapy  12. 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 Nausea and diarrhea while treated with Xeloda. Improved.  15. Hand-foot syndrome 03/07/2013 manifesting with erythema, dry desquamation and pain on the palms and erythema and pain on the soles. Progressive hand pain 04/25/2013. Xeloda discontinued.  16. Anemia-microcytic; chronic, stable. Ferritin low at 6 11/21/2013. IV iron 05/16/2015 and 05/23/2015. 17. Delayed nausea following cycle 1 FOLFOX. Aloxi added beginning with cycle  2 04/10/2014.  18. Rash and pruritus during the oxaliplatin infusion with cycle 3 FOLFOX, improved with Benadryl, Solu-Medrol, and Pepcid, no associated symptoms, no rash with cycle 4 FOLFOX 05/08/2014 19. Hives during the oxaliplatin infusion with cycle 11. Resolved with Benadryl. Infusion was completed.    Disposition: Mr. Cuny appears unchanged. He is enrolled in the [facility] hospice program. He confirmed at today's visit that he is not interested in further chemotherapy. We will continue to follow on a supportive care approach. He will return for a follow-up visit and Port-A-Cath flush in 3 weeks. He will contact the office in the interim with any problems.  Patient seen with Dr. Benay Spice.    Ned Card ANP/GNP-BC   08/01/2015  10:37 AM  This was a shared visit with Ned Card.  Julieanne Manson, M.D.

## 2015-08-08 ENCOUNTER — Telehealth: Payer: Self-pay | Admitting: Oncology

## 2015-08-08 NOTE — Telephone Encounter (Signed)
Faxed pt medical records to [place]

## 2015-08-19 ENCOUNTER — Other Ambulatory Visit: Payer: Self-pay | Admitting: *Deleted

## 2015-08-19 DIAGNOSIS — C162 Malignant neoplasm of body of stomach: Secondary | ICD-10-CM

## 2015-08-19 MED ORDER — OXYCODONE-ACETAMINOPHEN 5-325 MG PO TABS: 1.0000 | ORAL_TABLET | ORAL | Status: DC | PRN

## 2015-08-19 MED FILL — OXYCODONE/APAP 5/325 MG TAB: 5-325 | 8 days supply | Qty: 100 | Fill #0

## 2015-08-19 NOTE — Telephone Encounter (Addendum)
Message from hospice RN requesting refill on Oxycodone to be faxed to [facility]. Same done.

## 2015-08-22 ENCOUNTER — Other Ambulatory Visit (HOSPITAL_BASED_OUTPATIENT_CLINIC_OR_DEPARTMENT_OTHER): Payer: Medicare Other

## 2015-08-22 ENCOUNTER — Ambulatory Visit (HOSPITAL_BASED_OUTPATIENT_CLINIC_OR_DEPARTMENT_OTHER): Payer: Medicare Other

## 2015-08-22 ENCOUNTER — Telehealth: Payer: Self-pay | Admitting: Nurse Practitioner

## 2015-08-22 ENCOUNTER — Ambulatory Visit (HOSPITAL_BASED_OUTPATIENT_CLINIC_OR_DEPARTMENT_OTHER): Payer: Medicare Other | Admitting: Nurse Practitioner

## 2015-08-22 VITALS — BP 126/75 | HR 80 | Temp 98.3°F | Resp 18 | Wt 140.0 lb

## 2015-08-22 DIAGNOSIS — D509 Iron deficiency anemia, unspecified: Secondary | ICD-10-CM

## 2015-08-22 DIAGNOSIS — C786 Secondary malignant neoplasm of retroperitoneum and peritoneum: Secondary | ICD-10-CM | POA: Diagnosis not present

## 2015-08-22 DIAGNOSIS — C166 Malignant neoplasm of greater curvature of stomach, unspecified: Secondary | ICD-10-CM | POA: Diagnosis not present

## 2015-08-22 DIAGNOSIS — Z452 Encounter for adjustment and management of vascular access device: Secondary | ICD-10-CM

## 2015-08-22 DIAGNOSIS — R110 Nausea: Secondary | ICD-10-CM

## 2015-08-22 DIAGNOSIS — G893 Neoplasm related pain (acute) (chronic): Secondary | ICD-10-CM | POA: Diagnosis not present

## 2015-08-22 DIAGNOSIS — C162 Malignant neoplasm of body of stomach: Secondary | ICD-10-CM

## 2015-08-22 LAB — CBC WITH DIFFERENTIAL/PLATELET
BASO%: 0.8 % (ref 0.0–2.0)
Basophils Absolute: 0.1 10*3/uL (ref 0.0–0.1)
EOS ABS: 0 10*3/uL (ref 0.0–0.5)
EOS%: 0.5 % (ref 0.0–7.0)
HCT: 27.7 % — ABNORMAL LOW (ref 38.4–49.9)
HEMOGLOBIN: 9 g/dL — AB (ref 13.0–17.1)
LYMPH#: 0.8 10*3/uL — AB (ref 0.9–3.3)
LYMPH%: 11 % — AB (ref 14.0–49.0)
MCH: 26.4 pg — ABNORMAL LOW (ref 27.2–33.4)
MCHC: 32.6 g/dL (ref 32.0–36.0)
MCV: 81.1 fL (ref 79.3–98.0)
MONO#: 0.7 10*3/uL (ref 0.1–0.9)
MONO%: 9.6 % (ref 0.0–14.0)
NEUT%: 78.1 % — ABNORMAL HIGH (ref 39.0–75.0)
NEUTROS ABS: 5.5 10*3/uL (ref 1.5–6.5)
PLATELETS: 277 10*3/uL (ref 140–400)
RBC: 3.41 10*6/uL — AB (ref 4.20–5.82)
RDW: 17.5 % — AB (ref 11.0–14.6)
WBC: 7 10*3/uL (ref 4.0–10.3)

## 2015-08-22 MED ORDER — SUCRALFATE 1 GM/10ML PO SUSP: 1.0000 g | Freq: Three times a day (TID) | ORAL | Status: AC

## 2015-08-22 MED ORDER — HEPARIN SOD (PORK) LOCK FLUSH 100 UNIT/ML IV SOLN
500.0000 [IU] | Freq: Once | INTRAVENOUS | Status: AC | PRN
  Administered 2015-08-22: 500 [IU]
  Filled 2015-08-22: qty 5

## 2015-08-22 MED ORDER — SODIUM CHLORIDE 0.9 % IJ SOLN
10.0000 mL | INTRAMUSCULAR | Status: DC | PRN
  Administered 2015-08-22: 10 mL
  Filled 2015-08-22: qty 10

## 2015-08-22 MED FILL — CARAFATE 1 GM/10 ML SUSP: 1 | 10 days supply | Qty: 420 | Fill #0

## 2015-08-22 NOTE — Patient Instructions (Signed)

## 2015-08-22 NOTE — Telephone Encounter (Signed)
per pof to sch pt appt-gave pt copy of avs °

## 2015-08-22 NOTE — Progress Notes (Signed)
[facility] OFFICE PROGRESS NOTE   Diagnosis:  Gastric cancer  INTERVAL HISTORY:   Joseph Salinas returns as scheduled. He continues to have nausea and abdominal pain after eating. He takes pain medication and an antiemetic after the symptoms begin with good relief. He also started taking Alka-Seltzer. He continues to note blood mixed with emesis.  Objective:  Vital signs in last 24 hours:  Blood pressure 126/75, pulse 80, temperature 98.3 F (36.8 C), temperature source Oral, resp. rate 18, weight 140 lb (63.504 kg), SpO2 100 %.    HEENT: No thrush or ulcers. Resp: Lungs clear bilaterally. Cardio: Regular rate and rhythm. GI: Abdomen is soft. No hepatomegaly. No mass. Tender over the right upper abdomen. Vascular: No leg edema. Port-A-Cath without erythema.  Lab Results:  Lab Results  Component Value Date   WBC 7.0 08/22/2015   HGB 9.0* 08/22/2015   HCT 27.7* 08/22/2015   MCV 81.1 08/22/2015   PLT 277 08/22/2015   NEUTROABS 5.5 08/22/2015    Imaging:  No results found.  Medications: I have reviewed the patient's current medications.  Assessment/Plan: 1.Metastatic gastric cancer-status post biopsy of a gastric mass on 07/20/2012 with the pathology confirming adenocarcinoma, HER-2 non-amplified.  - CT 07/18/2012 consistent with a primary gastric mass and metastatic omental/peritoneal nodules  -biopsy of a peritoneal nodule 07/31/2012 consistent with metastatic adenocarcinoma  -Cycle 1 of FOLFOX on 08/02/2012  -Restaging CT 10/23/2012 consistent with a partial response-decrease in the primary gastric tumor and omental/peritoneal implants  -cycle 10 of FOLFOX completed 01/11/2013  -normal CEA 01/11/2013  -Restaging CT 01/23/2013 with improvement in the peritoneal metastases and an increased size of an indeterminate splenic lesion  -Initiation of maintenance Xeloda therapy on a 7 day on/7 day off schedule 01/29/2013.  -Xeloda dose reduced to 1000  mg twice daily 7 days on/7 days off beginning 02/26/2013 due to nausea and diarrhea.  -Xeloda dose reduced to 500 mg twice daily, 7 days/14 days off 03/20/2013.  -Xeloda discontinued 04/25/2013 due to progressive hand foot syndrome.  -CEA elevated at 9.1 on 04/25/2013.  -CEA stable at 9.5 on 05/09/2013.  -Initiation of infusional 5-FU as per the FOLFOX regimen 05/09/2013.  -CEA 7.4 on 05/24/2013.  -CEA 7.9 on 06/21/2013.  -CEA 9.3 on 08/22/2013 .  -CEA 11.1 on 10/01/2013.  -Restaging CT 10/01/2013 with persistent asymmetric gastric wall thickening in the greater curvature of the mid stomach. Dominant index gastrocolic lymph node decreased in size. Other smaller perigastric lymph nodes stable. No new or progressive findings in or around the stomach. Low-density subcapsular splenic lesion resolved.  -Continuation of infusional 5-fluorouracil, switched to a 4 week schedule beginning 01/02/2014 -CT 03/26/2014 with enlargement of the gastric mass and a possible new liver lesion -FOLFOX chemotherapy resumed 03/27/2014 -Restaging CT scan 06/03/2014 with mild decrease in volume of gastric wall mass. Decrease in volume of perigastric metastatic lymph node. Stable adrenal nodularity. No evidence of disease progression. -Cycle 6 FOLFOX 06/05/2014 - cycle 7 FOLFOX 06/19/2014 -Cycle 8 FOLFOX 07/03/2014 -Cycle 9 FOLFOX 07/17/2014 -Cycle 10 FOLFOX 07/31/2014 -Restaging CT 08/20/2014 revealed stable gastric wall thickening and a slight decrease in the size of a tumor nodule anterior to the stomach -Cycle 11 FOLFOX 08/28/2014 -Restaging CT evaluation 01/28/2015 with local progression of the primary gastric tumor along the greater curvature in the proximal body of the stomach. Progressive peritoneal tumor implants adjacent to the splenic hilum and in the gastrocolic ligament. New gastrohepatic ligament lymphadenopathy. New 2.9 cm right liver lobe mass. -Cycle 1  Taxol/cyramza beginning  02/07/2015 -Cycle 2 Taxol/cyramza beginning 03/14/2015 -Cycle 3 Taxol/cyramza beginning 04/25/2015 -Cycle 4 Taxol/cyramza beginning 05/30/2015 -CTs 06/17/2015 showed a decrease in the gastric mass, increased size of solitary liver lesion -CEA elevated March 2017  3. Multiple colon polyps documented on a colonoscopy 06/22/2012-tubular adenomas, tubulovillous adenomas, and hyperplastic polyps  4. Family history of colon cancer (mother). Status post genetics evaluation. Negative genetic testing on the Lynch/High Risk colon panel test  5. Pain secondary to the primary gastric mass or carcinomatosis -resolved  6. 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 Enrique Sack, multiple extractions were recommended  7. Anorexia, nausea, and early satiety-improved  8. 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 Nausea following chemotherapy-improved with the addition of prophylactic Decadron  10. Skin rash-most likely secondary to Decadron, improved  11. History of neutropenia secondary to chemotherapy  12. 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 Nausea and diarrhea while treated with Xeloda. Improved.  15. Hand-foot syndrome 03/07/2013 manifesting with erythema, dry desquamation and pain on the palms and erythema and pain on the soles. Progressive hand pain 04/25/2013. Xeloda discontinued.  16. Anemia-microcytic; chronic, stable. Ferritin low at 6 11/21/2013. IV iron 05/16/2015 and 05/23/2015. 17. Delayed nausea following cycle 1 FOLFOX. Aloxi added beginning with cycle 2 04/10/2014.  18. Rash and pruritus during the oxaliplatin infusion with cycle 3 FOLFOX, improved with Benadryl, Solu-Medrol, and Pepcid, no associated symptoms, no rash with cycle 4 FOLFOX  05/08/2014 19. Hives during the oxaliplatin infusion with cycle 11. Resolved with Benadryl. Infusion was completed.   Disposition: Joseph Salinas continues to slowly decline. He is enrolled in the [facility] hospice program.  He is symptomatic with pain and nausea. Symptoms mainly occur after eating. He will continue Protonix 40 mg twice daily. He will begin Carafate before meals and at bedtime. He will try pain medication and nausea medication prior to meals. I instructed him to discontinue the Alka-Seltzer due to the aspirin content.  He will return for a follow-up visit in 2-3 weeks. He will contact the office in the interim with any problems.  Plan reviewed with Dr. Benay Spice.  Ned Card ANP/GNP-BC   08/22/2015  10:51 AM

## 2015-08-25 ENCOUNTER — Encounter: Payer: Self-pay | Admitting: Nurse Practitioner

## 2015-08-25 MED FILL — ONDANSETRON HCL 8 MG TABLET: 8 | 10 days supply | Qty: 30 | Fill #1

## 2015-08-25 NOTE — Progress Notes (Signed)
Drug: Carafate 1GM/10ML suspension  Form: This prescription was not reimbursable by your patient's insurance.  Claim Rejection Details  (26) - NDC Not Covered

## 2015-09-02 MED FILL — PROCHLORPERAZINE 10 MG TAB: 10 | 7 days supply | Qty: 30 | Fill #1

## 2015-09-03 ENCOUNTER — Telehealth: Payer: Self-pay | Admitting: *Deleted

## 2015-09-03 MED ORDER — BACLOFEN 10 MG PO TABS: 5.0000 mg | ORAL_TABLET | Freq: Three times a day (TID) | ORAL | Status: AC | PRN

## 2015-09-03 MED ORDER — PROMETHAZINE HCL 12.5 MG RE SUPP: 12.5000 mg | Freq: Four times a day (QID) | RECTAL | Status: DC | PRN

## 2015-09-03 MED FILL — PHENADOZ 12.5 MG SUPP: 12.5 | 3 days supply | Qty: 12 | Fill #0

## 2015-09-03 MED FILL — BACLOFEN 10 MG TABLET: 10 | 20 days supply | Qty: 30 | Fill #0

## 2015-09-03 NOTE — Telephone Encounter (Signed)
Call from [place], Hospice RN: Pt has hiccups. Please call in medication to [facility]. Reviewed with Dr. Benay Spice: There is no medication to treat hiccups that'll work better than Compazine. Continue that PRN. Called [place] hospice, Abigail Butts, RN made aware.

## 2015-09-03 NOTE — Telephone Encounter (Signed)
Call from [place], Hospice RN: Wife states pt had Rx for hiccups in the past that helped. Pt is staggering Compazine and Zofran for nausea right now and it is not helping hiccups. Angela Nevin is also requesting antiemetic suppository. Reviewed above with Ned Card, NP: Order received for Baclofen 5 mg TID PRN hiccups. Phenergan 12.5 mg suppository Q6 hours PRN N/V. Orders called to [facility], e-prescribed to [facility].

## 2015-09-03 NOTE — Addendum Note (Signed)
Addended by: Brien Few on: 09/03/2015 04:08 PM   Modules accepted: Orders

## 2015-09-09 ENCOUNTER — Telehealth: Payer: Self-pay | Admitting: *Deleted

## 2015-09-09 DIAGNOSIS — C162 Malignant neoplasm of body of stomach: Secondary | ICD-10-CM

## 2015-09-09 MED ORDER — OXYCODONE-ACETAMINOPHEN 5-325 MG PO TABS: 1.0000 | ORAL_TABLET | ORAL | Status: DC | PRN

## 2015-09-09 MED FILL — OXYCODONE/APAP 5/325 MG TAB: 5-325 | 8 days supply | Qty: 100 | Fill #0

## 2015-09-09 NOTE — Telephone Encounter (Signed)
"  Lincoln Brigham RN Palliative Care [phone] Requesting refill Oxycodone-APAP 5-325 mg take 1-2 q4hrs prn faxed to [facility]."  Will notify MD.

## 2015-09-15 ENCOUNTER — Other Ambulatory Visit: Payer: Self-pay

## 2015-09-15 ENCOUNTER — Ambulatory Visit: Payer: Self-pay | Admitting: Nurse Practitioner

## 2015-09-15 ENCOUNTER — Telehealth: Payer: Self-pay | Admitting: *Deleted

## 2015-09-15 NOTE — Telephone Encounter (Signed)
Received call from Brandon Melnick, RN @ [facility] requesting a call back from nurse about pt's situations.   Spoke with Cristela Blue and was informed re: 1.    Could pt have labs drawn at home by hospice nurse through portacath , and lhave results faxed to Dr. Benay Spice for review. Marianna saw pt today for a first visit , and stated she noticed pt was jaundiced including sclerae.  Maura wanted to know if Dr. Benay Spice would want to include BMET or CMET along with CBC. 3.    Pt has been taking Percocet 2 tabs every 4 hours for pain.  Would Dr. Benay Spice consider Long acting pain meds to be added. 4.    Pt has problem with Carafate elixir -  Vomited coffed ground emesis, and black stools.   Could pt switch to  Sodium Bicarb for ease of swallowing -  Pt and wife stated pt tolerates Alka-Seltzer           Better than Carafate.   Pt was discouraged from taking Alka-Seltzer due to aspirin content at his last office visit with Lattie Haw, NP on 08/22/15. 5.    Pt needs refill of  Miralax, and Carafate refills to [facility].  Maura's    Phone      [phone].

## 2015-09-17 ENCOUNTER — Other Ambulatory Visit: Payer: Self-pay

## 2015-09-17 MED ORDER — METHADONE HCL 5 MG PO TABS: 5.0000 mg | ORAL_TABLET | Freq: Two times a day (BID) | ORAL | Status: DC

## 2015-09-17 MED FILL — POLYETHYLENE GLYCOL 3350 PO: 30 days supply | Qty: 527 | Fill #0

## 2015-09-17 NOTE — Telephone Encounter (Signed)
"  This is Joseph Salinas with [facility].  Calling after talking to nurse Monday and ave not received a return call.  You can put me through to the doctor's nursewhich may be best."   Call transferred to ext [phone] per [facility] request.  Received voicemail.

## 2015-09-17 NOTE — Telephone Encounter (Signed)
Maura from Hospice called and stated that pt is taking 8-10 oxycodone 5/325 per day.  Would like to have something long acting.  Stated the hospice Dr suggested methadone 5mg  TID but wanted [place] to check with Dr  Benay Spice to make sure it is ok with him.  Dr Benay Spice  Wants pt to only have methadone 5mg  BID for now.  Maura aware and stated she will pick up prescription on Friday.  Prescription for methadone 5mg  bid #20 with no refills faxed to [facility].

## 2015-09-18 MED FILL — METHADONE HCL 5 MG TABLET: 5 | 10 days supply | Qty: 20 | Fill #0

## 2015-09-18 MED FILL — SODIUM BICARB 650 MG TABLET: 650 | 12 days supply | Qty: 50 | Fill #0

## 2015-09-19 ENCOUNTER — Telehealth: Payer: Self-pay | Admitting: *Deleted

## 2015-09-19 DIAGNOSIS — C169 Malignant neoplasm of stomach, unspecified: Secondary | ICD-10-CM

## 2015-09-19 MED ORDER — PANTOPRAZOLE SODIUM 40 MG PO TBEC: 40.0000 mg | DELAYED_RELEASE_TABLET | Freq: Two times a day (BID) | ORAL | Status: AC

## 2015-09-19 MED ORDER — LORAZEPAM 0.5 MG PO TABS: 0.5000 mg | ORAL_TABLET | Freq: Three times a day (TID) | ORAL | Status: DC | PRN

## 2015-09-19 NOTE — Telephone Encounter (Signed)
Call from [place], [facility] RN reporting pt is having more frequent coffee ground emesis and increased pain. He has not yet started Methadone. Pt and family wanted to wait until nurse visit today before starting med.  Pt is requesting medication for anxiety. Per Cristela Blue, he is weaker. They are obtaining equipment, including hospital bed, for use in home. Noted Protonix had not been refilled this month, wife stated they are out of med. Will send refill to [facility] and discuss with providers for anxiolytic medication. Order for Lorazepam received from [facility]. Rx called to pharmacy.

## 2015-09-26 ENCOUNTER — Telehealth: Payer: Self-pay | Admitting: *Deleted

## 2015-09-26 MED FILL — METHADONE HCL 5 MG TABLET: 5 | 30 days supply | Qty: 60 | Fill #0

## 2015-09-26 NOTE — Telephone Encounter (Signed)
1407: Message from Brandon Melnick requesting refill on Methadone, Baclofen and Zofran to be faxed to [facility]. Methadone has been effective for pain.  1424: Message hospice RN, Cristela Blue reporting she has contacted hospice MD to refill medications. Please disregard earlier message.

## 2015-09-29 MED FILL — BACLOFEN 10 MG TABLET: 10 | 30 days supply | Qty: 45 | Fill #0

## 2015-09-29 MED FILL — ONDANSETRON HCL 8 MG TABLET: 8 | 30 days supply | Qty: 90 | Fill #0

## 2015-10-01 ENCOUNTER — Telehealth: Payer: Self-pay | Admitting: *Deleted

## 2015-10-01 ENCOUNTER — Other Ambulatory Visit: Payer: Self-pay | Admitting: *Deleted

## 2015-10-01 DIAGNOSIS — C162 Malignant neoplasm of body of stomach: Secondary | ICD-10-CM

## 2015-10-01 MED ORDER — OXYCODONE-ACETAMINOPHEN 10-325 MG PO TABS: 1.0000 | ORAL_TABLET | ORAL | Status: DC | PRN

## 2015-10-01 MED ORDER — LORAZEPAM 0.5 MG PO TABS: 0.5000 mg | ORAL_TABLET | Freq: Three times a day (TID) | ORAL | Status: DC | PRN

## 2015-10-01 MED ORDER — OXYCODONE-ACETAMINOPHEN 5-325 MG PO TABS: 1.0000 | ORAL_TABLET | ORAL | Status: DC | PRN

## 2015-10-01 MED ORDER — ONDANSETRON HCL 8 MG PO TABS: 8.0000 mg | ORAL_TABLET | Freq: Three times a day (TID) | ORAL | Status: AC | PRN

## 2015-10-01 MED FILL — OXYCODONE/APAP 5/325MG: 5-325 | 8 days supply | Qty: 100 | Fill #0

## 2015-10-01 MED FILL — LORazepam 0.5 MG TABS: 0.5 | 10 days supply | Qty: 30 | Fill #0

## 2015-10-01 NOTE — Telephone Encounter (Signed)
Message received from [facility] with [facility] requesting refill for Ativan, Zofran and Percocet.  She is also requesting order to increase Methadone to 5 mg every 8 hrs PRN.  OK to increase Methadone as requested per L. Marcello Moores NP.  Cristela Blue also states that pt is having increase in nausea and vomiting despite taking Zofran and Compazine.  She has instructed pt to begin Phenergan supp as ordered.  Refill for Ativan, Zofran and Percocet complete.  Return call placed to [facility] with above information.

## 2015-10-07 ENCOUNTER — Telehealth: Payer: Self-pay | Admitting: *Deleted

## 2015-10-07 DIAGNOSIS — C162 Malignant neoplasm of body of stomach: Secondary | ICD-10-CM

## 2015-10-07 MED ORDER — METHADONE HCL 5 MG PO TABS: ORAL_TABLET | ORAL | Status: AC

## 2015-10-07 MED ORDER — OXYCODONE-ACETAMINOPHEN 5-325 MG PO TABS: 1.0000 | ORAL_TABLET | ORAL | Status: DC | PRN

## 2015-10-07 MED FILL — NATURAL BALANCE TEARS EYE D: 0.1-0.3 | 7 days supply | Qty: 15 | Fill #0

## 2015-10-07 MED FILL — PROCHLORPERAZINE 10 MG TAB: 10 | 17 days supply | Qty: 75 | Fill #0

## 2015-10-07 MED FILL — PANTOPRAZOLE SOD DR 40 MG T: 40 | 30 days supply | Qty: 60 | Fill #0

## 2015-10-07 MED FILL — hydrOXYzine HCL 10 MG TABS: 10 | 13 days supply | Qty: 80 | Fill #0

## 2015-10-07 MED FILL — METHADONE HCL 5 MG TABLET: 5 | 15 days supply | Qty: 75 | Fill #0

## 2015-10-07 NOTE — Telephone Encounter (Signed)
Call from Brandon Melnick, Hospice RN requesting Percocet refill. Pt is taking #2 Percocet every 4 hours while awake. His Methadone was increased to 5 mg TID last week by Dr. Lyman Speller, Hospice MD. She reports pt looks a bit jaundiced and is having some itching, they have initiated Atarax PRN.  Reviewed with Dr. Benay Spice: Increase Methadone to 10 mg QAM and QPM. Take 5 mg mid-day. OK to refill Percocet. Rx faxed to [facility], called Maura with orders.   Called pt's wife to touch base. She reports he is weak and unable to walk long distances. Instructed her to call office as needed, we can work pt in for office visit if he needs to come in. She was appreciative of call.

## 2015-10-15 ENCOUNTER — Telehealth: Payer: Self-pay | Admitting: *Deleted

## 2015-10-15 DIAGNOSIS — C162 Malignant neoplasm of body of stomach: Secondary | ICD-10-CM

## 2015-10-15 MED ORDER — SENNOSIDES-DOCUSATE SODIUM 8.6-50 MG PO TABS: ORAL_TABLET | ORAL | 1 refills | Status: AC

## 2015-10-15 MED ORDER — OXYCODONE-ACETAMINOPHEN 5-325 MG PO TABS: 1.0000 | ORAL_TABLET | ORAL | 0 refills | Status: DC | PRN

## 2015-10-15 MED FILL — DOC-Q-LAX TABLET: 8.6-50 | 30 days supply | Qty: 100 | Fill #0

## 2015-10-15 NOTE — Telephone Encounter (Signed)
Message from [place], Hospice RN reporting pt's pain is not well managed. He decreased his Methadone to 5 mg Q 12 hours on 7/23 because of BLE edema. Swelling is down today (trace to 1+ per Cristela Blue). Pt is unwilling to increase Methadone. He continues Oxycodone 5/325 #2 tablets Q 4 hours. Pt needs a refill on this and Senna-S.  Case was discussed in Hospice team meeting, Cristela Blue notes pt has significant pain after eating. Asks if Reglan would be appropriate. Cristela Blue is also asking if Fentanyl would be appropriate for this pt.   Discussed above with Dr. Benay Spice: Edema is not likely related to Methadone. Would stick with current Methadone dose and Oxycodone PRN for now. Can switch to MS Contin if change in long acting med is needed. Will consider Fentanyl if having difficulty swallowing or if pt is being awakened at night with pain.  Per Dr. Benay Spice: Reglan will not help post-prandial pain, it is related to his cancer.   Maura notified of the above, voiced understanding.  Refills will be faxed to [facility].

## 2015-10-16 MED FILL — OXYCODONE/APAP 5/325MG: 5-325 | 8 days supply | Qty: 100 | Fill #0

## 2015-10-20 MED FILL — SARNA ANTI-ITCH LOTION: 0.5-0.5 | 7 days supply | Qty: 222 | Fill #0

## 2015-10-20 MED FILL — LORazepam 0.5 MG TABS: 0.5 | 10 days supply | Qty: 30 | Fill #1

## 2015-10-24 ENCOUNTER — Telehealth: Payer: Self-pay | Admitting: *Deleted

## 2015-10-24 MED FILL — BACLOFEN 10 MG TABLET: 10 | 30 days supply | Qty: 45 | Fill #1

## 2015-10-24 MED FILL — SODIUM BICARB 650 MG TABLET: 650 | 12 days supply | Qty: 50 | Fill #1

## 2015-10-24 NOTE — Telephone Encounter (Signed)
Message from [place], Hospice RN reporting pt has 1+ pitting edema BLE. Swelling is painful. BP 108/70 today. Cristela Blue asks if Dr. Benay Spice recommends diuretic for this. Reviewed with MD: Elevate legs and try compression stockings.  Pt had been taking Advil for pain, Maura instructed him to stop due to his hx of bleeding. Asks if pt can add Tylenol PRN. YES, per Dr. Benay Spice.

## 2015-10-28 ENCOUNTER — Other Ambulatory Visit: Payer: Self-pay | Admitting: *Deleted

## 2015-10-28 DIAGNOSIS — G4700 Insomnia, unspecified: Secondary | ICD-10-CM

## 2015-10-28 MED ORDER — ZOLPIDEM TARTRATE 10 MG PO TABS: 10.0000 mg | ORAL_TABLET | Freq: Every evening | ORAL | 2 refills | Status: AC | PRN

## 2015-10-28 NOTE — Telephone Encounter (Signed)
Message received from [facility] with [facility] requesting refill on Ambien.  Dr. Benay Spice notified and prescription faxed to [place].  [place] notified.

## 2015-10-29 MED FILL — ZOLPIDEM TARTRATE 10 MG TAB: 10 | 30 days supply | Qty: 30 | Fill #0

## 2015-10-30 ENCOUNTER — Ambulatory Visit (HOSPITAL_BASED_OUTPATIENT_CLINIC_OR_DEPARTMENT_OTHER): Payer: Medicare Other | Admitting: Nurse Practitioner

## 2015-10-30 ENCOUNTER — Telehealth: Payer: Self-pay | Admitting: *Deleted

## 2015-10-30 VITALS — BP 98/70 | HR 96 | Temp 98.4°F | Resp 18 | Ht 64.5 in | Wt 132.3 lb

## 2015-10-30 DIAGNOSIS — C786 Secondary malignant neoplasm of retroperitoneum and peritoneum: Secondary | ICD-10-CM | POA: Diagnosis not present

## 2015-10-30 DIAGNOSIS — C162 Malignant neoplasm of body of stomach: Secondary | ICD-10-CM

## 2015-10-30 DIAGNOSIS — C787 Secondary malignant neoplasm of liver and intrahepatic bile duct: Secondary | ICD-10-CM

## 2015-10-30 DIAGNOSIS — C169 Malignant neoplasm of stomach, unspecified: Secondary | ICD-10-CM | POA: Diagnosis not present

## 2015-10-30 DIAGNOSIS — D509 Iron deficiency anemia, unspecified: Secondary | ICD-10-CM | POA: Diagnosis not present

## 2015-10-30 NOTE — Progress Notes (Addendum)
[place] OFFICE PROGRESS NOTE   Diagnosis:  Gastric cancer  INTERVAL HISTORY:   Joseph Salinas returns for an unscheduled visit. He is followed by the [facility] program. He complains of abdominal pain mainly right upper abdomen. He is taking methadone 3 times a day. He takes one Percocet tablet every 4 hours. He notes good relief of pain with this regimen and does not want to change anything today. He notes abdominal fullness. He has leg swelling. He continues to have intermittent nausea/vomiting. He vomits blood. Appetite is poor. Bowels are moving.  Objective:  Vital signs in last 24 hours:  Blood pressure 98/70, pulse 96, temperature 98.4 F (36.9 C), temperature source Oral, resp. rate 18, height 5' 4.5" (1.638 m), weight 132 lb 4.8 oz (60 kg), SpO2 100 %.    HEENT: No thrush or ulcers. Lymphatics: No palpable cervical or supra-clavicular lymph nodes. Resp: Breath sounds are diminished at the right lower abdomen. Cardio: Regular rate and rhythm. GI: Abdomen is distended. Liver is palpable throughout the right upper abdomen. He appears to have ascites. Vascular: Pitting edema below the knees bilaterally. Port-A-Cath without erythema.   Lab Results:  Lab Results  Component Value Date   WBC 7.0 08/22/2015   HGB 9.0 (L) 08/22/2015   HCT 27.7 (L) 08/22/2015   MCV 81.1 08/22/2015   PLT 277 08/22/2015   NEUTROABS 5.5 08/22/2015    Imaging:  No results found.  Medications: I have reviewed the patient's current medications.  Assessment/Plan: 1.Metastatic gastric cancer-status post biopsy of a gastric mass on 07/20/2012 with the pathology confirming adenocarcinoma, HER-2 non-amplified.  - CT 07/18/2012 consistent with a primary gastric mass and metastatic omental/peritoneal nodules  -biopsy of a peritoneal nodule 07/31/2012 consistent with metastatic adenocarcinoma  -Cycle 1 of FOLFOX on 08/02/2012  -Restaging CT 10/23/2012 consistent with a  partial response-decrease in the primary gastric tumor and omental/peritoneal implants  -cycle 10 of FOLFOX completed 01/11/2013  -normal CEA 01/11/2013  -Restaging CT 01/23/2013 with improvement in the peritoneal metastases and an increased size of an indeterminate splenic lesion  -Initiation of maintenance Xeloda therapy on a 7 day on/7 day off schedule 01/29/2013.  -Xeloda dose reduced to 1000 mg twice daily 7 days on/7 days off beginning 02/26/2013 due to nausea and diarrhea.  -Xeloda dose reduced to 500 mg twice daily, 7 days/14 days off 03/20/2013.  -Xeloda discontinued 04/25/2013 due to progressive hand foot syndrome.  -CEA elevated at 9.1 on 04/25/2013.  -CEA stable at 9.5 on 05/09/2013.  -Initiation of infusional 5-FU as per the FOLFOX regimen 05/09/2013.  -CEA 7.4 on 05/24/2013.  -CEA 7.9 on 06/21/2013.  -CEA 9.3 on 08/22/2013 .  -CEA 11.1 on 10/01/2013.  -Restaging CT 10/01/2013 with persistent asymmetric gastric wall thickening in the greater curvature of the mid stomach. Dominant index gastrocolic lymph node decreased in size. Other smaller perigastric lymph nodes stable. No new or progressive findings in or around the stomach. Low-density subcapsular splenic lesion resolved.  -Continuation of infusional 5-fluorouracil, switched to a 4 week schedule beginning 01/02/2014 -CT 03/26/2014 with enlargement of the gastric mass and a possible new liver lesion -FOLFOX chemotherapy resumed 03/27/2014 -Restaging CT scan 06/03/2014 with mild decrease in volume of gastric wall mass. Decrease in volume of perigastric metastatic lymph node. Stable adrenal nodularity. No evidence of disease progression. -Cycle 6 FOLFOX 06/05/2014 - cycle 7 FOLFOX 06/19/2014 -Cycle 8 FOLFOX 07/03/2014 -Cycle 9 FOLFOX 07/17/2014 -Cycle 10 FOLFOX 07/31/2014 -Restaging CT 08/20/2014 revealed stable gastric wall thickening and  a slight decrease in the size of a tumor nodule anterior to the  stomach -Cycle 11 FOLFOX 08/28/2014 -Restaging CT evaluation 01/28/2015 with local progression of the primary gastric tumor along the greater curvature in the proximal body of the stomach. Progressive peritoneal tumor implants adjacent to the splenic hilum and in the gastrocolic ligament. New gastrohepatic ligament lymphadenopathy. New 2.9 cm right liver lobe mass. -Cycle 1 Taxol/cyramza beginning 02/07/2015 -Cycle 2 Taxol/cyramza beginning 03/14/2015 -Cycle 3 Taxol/cyramza beginning 04/25/2015 -Cycle 4 Taxol/cyramza beginning 05/30/2015 -CTs 06/17/2015 showed a decrease in the gastric mass, increased size of solitary liver lesion -CEA elevated March 2017  3. Multiple colon polyps documented on a colonoscopy 06/22/2012-tubular adenomas, tubulovillous adenomas, and hyperplastic polyps  4. Family history of colon cancer (mother). Status post genetics evaluation. Negative genetic testing on the Lynch/High Risk colon panel test  5. Pain secondary to the primary gastric mass or carcinomatosis -resolved  6. 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 Enrique Sack, multiple extractions were recommended  7. Anorexia, nausea, and early satiety-improved  8. 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 Nausea following chemotherapy-improved with the addition of prophylactic Decadron  10. Skin rash-most likely secondary to Decadron, improved  11. History of neutropenia secondary to chemotherapy  12. 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 Nausea and diarrhea while treated with Xeloda. Improved.  15. Hand-foot syndrome 03/07/2013 manifesting with erythema, dry desquamation and pain on the palms and erythema and pain on the soles. Progressive hand pain 04/25/2013.  Xeloda discontinued.  16. Anemia-microcytic; chronic, stable. Ferritin low at 6 11/21/2013. IV iron 05/16/2015 and 05/23/2015. 17. Delayed nausea following cycle 1 FOLFOX. Aloxi added beginning with cycle 2 04/10/2014.  18. Rash and pruritus during the oxaliplatin infusion with cycle 3 FOLFOX, improved with Benadryl, Solu-Medrol, and Pepcid, no associated symptoms, no rash with cycle 4 FOLFOX 05/08/2014 19. Hives during the oxaliplatin infusion with cycle 11. Resolved with Benadryl. Infusion was completed.   Disposition: Joseph Salinas has progressive metastatic gastric cancer. He is followed by the [facility] program. He is symptomatic with abdominal pain and distention. The pain is related to hepatomegaly/liver metastases/carcinomatosis. He appears to have ascites on exam. We are referring him for a therapeutic paracentesis.  His pain is well-controlled on the current regimen of methadone with Percocet as needed. He will continue the same.  We scheduled a return visit in 3 weeks. He will contact the office prior to that visit with further problems.  Patient seen with Dr. Benay Spice.    Ned Card ANP/GNP-BC   10/30/2015  4:32 PM  This was a shared visit with Ned Card. Joseph Salinas was interviewed and examined. We will refer him for a palliative paracentesis. He will continue the current narcotic regimen.  Julieanne Manson, M.D.

## 2015-10-30 NOTE — Telephone Encounter (Signed)
Call received from [facility] with [facility] to inform Dr. Benay Spice that pt is having complaints of pain and discomfort when he is speaking with his wife, but denies any discomfort to [place].  Pt.'s wife is concerned that pt.'s abdomen may need to be drained d/t fullness.  Call placed to pt.'s wife and pt.'s wife agreeable to bringing pt in today to be seen by L. Marcello Moores NP.  Pt.'s wife appreciative of call.

## 2015-10-31 ENCOUNTER — Telehealth: Payer: Self-pay | Admitting: *Deleted

## 2015-10-31 ENCOUNTER — Other Ambulatory Visit: Payer: Self-pay | Admitting: *Deleted

## 2015-10-31 ENCOUNTER — Telehealth: Payer: Self-pay

## 2015-10-31 ENCOUNTER — Ambulatory Visit
Admission: RE | Admit: 2015-10-31 | Discharge: 2015-10-31 | Disposition: A | Discharge status: Home / Self Care | Payer: Medicare Other | Source: Ambulatory Visit | Attending: Oncology | Admitting: Oncology

## 2015-10-31 DIAGNOSIS — R188 Other ascites: Secondary | ICD-10-CM | POA: Insufficient documentation

## 2015-10-31 DIAGNOSIS — C162 Malignant neoplasm of body of stomach: Secondary | ICD-10-CM | POA: Insufficient documentation

## 2015-10-31 NOTE — Telephone Encounter (Signed)
Call received from [facility] with [facility] requesting office note from 10/30/15.  Note faxed to [phone] per Maura's request.

## 2015-10-31 NOTE — Telephone Encounter (Signed)
Paracentesis results in chart from today. 1) Amount of fluid too small to do paracentesis. 2) suspected progression.

## 2015-11-03 ENCOUNTER — Telehealth: Payer: Self-pay | Admitting: *Deleted

## 2015-11-03 NOTE — Telephone Encounter (Signed)
Message from [place] requesting office note from last week. Returned call, she did receive fax that was sent 8/11.  Update: Pt is not taking meds as prescribed. Pt is not taking Protonix or Lorazepam. Not taking Methadone as frequently as recommended.  Pt reports to RN that he is "fine" has no pain. Pt's wife reports his pain is severe at night. Ambien helps him sleep. Maura re-educated pt, counted meds and will re-assess on 11/07/15. Per Cristela Blue, pt vomited several times after office visit. It was later discovered car was accidentally left running in garage. Family is unsure if vomiting was related to fumes.  Hospice RN asks if continuous IV pain med infusion would be an option for pt. She will discuss respite visit to [facility] with family on Fri.

## 2015-11-04 ENCOUNTER — Other Ambulatory Visit: Payer: Self-pay | Admitting: Oncology

## 2015-11-04 DIAGNOSIS — C162 Malignant neoplasm of body of stomach: Secondary | ICD-10-CM

## 2015-11-07 ENCOUNTER — Telehealth: Payer: Self-pay | Admitting: *Deleted

## 2015-11-07 ENCOUNTER — Telehealth: Payer: Self-pay | Admitting: Oncology

## 2015-11-07 NOTE — Telephone Encounter (Signed)
lvm to inform pt of 9/1 appt at 9 am per los

## 2015-11-07 NOTE — Telephone Encounter (Signed)
Returned call to pt's wife, she reports his penis is swollen and painful and he is voiding small amounts. Pt also has swelling in BLE and distended abdomen.  Reviewed with Dr. Benay Spice: Elevate lower extremities and genitals when possible. Returned call to [place], she reports he is elevating, he is concerned with output. Per Dr. Benay Spice: Decreased output is to be expected since isn't eating and drinking as much. [name] voiced understanding, they have a hospital bed and are able to elevate his lower half.

## 2015-11-10 ENCOUNTER — Other Ambulatory Visit: Payer: Self-pay | Admitting: *Deleted

## 2015-11-10 ENCOUNTER — Telehealth: Payer: Self-pay | Admitting: *Deleted

## 2015-11-10 DIAGNOSIS — C162 Malignant neoplasm of body of stomach: Secondary | ICD-10-CM

## 2015-11-10 MED ORDER — OXYCODONE-ACETAMINOPHEN 5-325 MG PO TABS: 1.0000 | ORAL_TABLET | ORAL | 0 refills | Status: AC | PRN

## 2015-11-10 NOTE — Telephone Encounter (Signed)
Message received from [facility] with [place] requesting refill for Percocet and to notify Dr. Benay Spice that pt has had increased edema to penis and scrotum.  She states that pt had 38f foley catheter placed over the weekend and he has 2+ pitting edema to his lower legs. Pt is currently taking Lasix 40 mg daily and Maura would like to know if Dr. Benay Spice would like to continue this med at this time.  Per Dr. Benay Spice, no need for Lasix at this time.  [place] notified of MD instructions.

## 2015-11-11 ENCOUNTER — Other Ambulatory Visit: Payer: Self-pay | Admitting: *Deleted

## 2015-11-11 DIAGNOSIS — C162 Malignant neoplasm of body of stomach: Secondary | ICD-10-CM

## 2015-11-11 MED FILL — OXYCODONE/APAP 5/325MG: 5-325 | 8 days supply | Qty: 100 | Fill #0

## 2015-11-12 ENCOUNTER — Telehealth: Payer: Self-pay | Admitting: Oncology

## 2015-11-12 ENCOUNTER — Other Ambulatory Visit: Payer: Self-pay | Admitting: *Deleted

## 2015-11-12 NOTE — Progress Notes (Signed)
Per Dr. Benay Spice : Move office visit 11/21/15 to another day. LOS to scheduler.

## 2015-11-12 NOTE — Telephone Encounter (Signed)
Used language line interpreter [phone] to inform pt of r/s appt from 9/1 to 9/5 at 1030 am per LOS

## 2015-11-14 ENCOUNTER — Telehealth: Payer: Self-pay | Admitting: *Deleted

## 2015-11-14 MED ORDER — LORAZEPAM 1 MG PO TABS: 1.0000 mg | ORAL_TABLET | ORAL | 0 refills | Status: DC | PRN

## 2015-11-14 MED FILL — LORazepam 1 MG TABS: 1 | 12 days supply | Qty: 75 | Fill #0

## 2015-11-14 NOTE — Telephone Encounter (Signed)
Call received from [place] with [facility] to inform Dr. Benay Spice that patients Ativan dose has been increased to 1 mg. Every 4hrs. PRN and that pt is taking rattlesnake powder capsules.  Dr. Benay Spice notified.

## 2015-11-17 ENCOUNTER — Telehealth: Payer: Self-pay | Admitting: *Deleted

## 2015-11-17 NOTE — Telephone Encounter (Addendum)
Message from [place], Hospice RN: Pt has increased edema since stopping Lasix. Pt's family is requesting to resume Lasix. Returned call to [place], she reports pitting edema extends above hips. Informed Maura that MD is out of office, will review with Dr. Benay Spice when back in office since this has been an on-going issue. She voiced understanding.

## 2015-11-21 ENCOUNTER — Ambulatory Visit: Payer: Self-pay | Admitting: Oncology

## 2015-11-21 ENCOUNTER — Telehealth: Payer: Self-pay | Admitting: *Deleted

## 2015-11-21 MED ORDER — TORSEMIDE 20 MG PO TABS: 20.0000 mg | ORAL_TABLET | Freq: Every day | ORAL | 0 refills | Status: DC | PRN

## 2015-11-21 MED FILL — MORPHINE SULF 100 MG/5 ML S: 100 | 10 days supply | Qty: 30 | Fill #0

## 2015-11-21 MED FILL — TORSEMIDE 20 MG TABLET: 20 | 5 days supply | Qty: 5 | Fill #0

## 2015-11-21 NOTE — Telephone Encounter (Signed)
Call from Joseph Salinas, Hospice RN reporting pt's scrotal edema is slightly improved after 2 doses of Torsemide (ordered by hospice NP overnight). He is more comfortable after taking the #2 doses that was ordered. His is declining, had some intractable vomiting (black/ dark brown vomitus). Relieved with Q2hour Thorazine. Wife was instructed to change frequency to Q4 hours PRN. Pt and family requested oxygen in the home, hospice has supplied an O2 concentrator. Joseph Salinas has arranged for nurse to call/ see pt over the weekend if needed. Asking if Dr. Benay Salinas will order a few doses of Torsemide for pt to have on hand. RN will assess need for continued Thorazine over the weekend. Pt has refused [place] admission. Discussed with Dr. Benay Salinas: OK to refill small supply of Torsemide for PRN use.

## 2015-11-25 ENCOUNTER — Telehealth: Payer: Self-pay | Admitting: *Deleted

## 2015-11-25 ENCOUNTER — Ambulatory Visit: Payer: Self-pay | Admitting: Oncology

## 2015-11-25 ENCOUNTER — Other Ambulatory Visit: Payer: Self-pay | Admitting: *Deleted

## 2015-11-25 MED ORDER — TORSEMIDE 20 MG PO TABS: 20.0000 mg | ORAL_TABLET | Freq: Every day | ORAL | 0 refills | Status: AC | PRN

## 2015-11-25 MED ORDER — PROMETHAZINE HCL 12.5 MG RE SUPP: 12.5000 mg | Freq: Four times a day (QID) | RECTAL | 0 refills | Status: AC | PRN

## 2015-11-25 MED FILL — PHENADOZ 12.5 MG SUPP: 12.5 | 3 days supply | Qty: 12 | Fill #0

## 2015-11-25 MED FILL — ZOLPIDEM TARTRATE 10 MG TAB: 10 | 30 days supply | Qty: 30 | Fill #1

## 2015-11-25 NOTE — Telephone Encounter (Signed)
OK for refill for Phenergan supp and Demadex per Dr. Benay Spice.

## 2015-11-25 NOTE — Telephone Encounter (Signed)
Call from [place], [facility] RN requesting refill on Torsemide. Pt had relief over the weekend with daily dose. He and family feel like it helps to make him comfortable. He continues to have painful swelling of his penis leading to some retention- per Cristela Blue.  Pt now has an open sore at tip of his penis.  BP is low but pt is no longer getting out of bed, family understands diuretic may lower his BP as well. Nausea is improved- pt needs refill on Phenergan suppositories as well.

## 2015-11-26 MED FILL — TORSEMIDE 20 MG TABLET: 20 | 30 days supply | Qty: 30 | Fill #0

## 2015-11-28 ENCOUNTER — Telehealth: Payer: Self-pay | Admitting: *Deleted

## 2015-11-28 MED ORDER — LORAZEPAM 1 MG PO TABS: 2.0000 mg | ORAL_TABLET | ORAL | 0 refills | Status: AC | PRN

## 2015-11-28 NOTE — Telephone Encounter (Signed)
Call received from [facility] with [place] stating that pt is actively dying and is currently experiencing terminal agitation.  She is requesting Ativan 2 mg every 4 hrs PRN.  Plan is to transfer pt to [facility] when bed becomes available.  Dr. Benay Spice notified of patient's condition and order received to increase Ativan to 2 mg every 4hrs PRN.  Maura notified and appreciative of call back.

## 2015-12-01 ENCOUNTER — Telehealth: Payer: Self-pay | Admitting: *Deleted

## 2015-12-01 NOTE — Telephone Encounter (Signed)
Message from [place], [place] RN reporting pt expired on 12/05/15 @ 2216. Message forwarded to Dr. Benay Spice.

## 2015-12-21 DEATH — deceased

## 2015-12-30 ENCOUNTER — Encounter: Payer: Self-pay | Admitting: Oncology

## 2015-12-30 NOTE — Progress Notes (Signed)
Entered chart to assist spouse whom had several medical bills she brought in for her husband and wanted to know how to handle them. Made several phone calls for her and put her on the phone to speak with the representatives at the collections company([employer]) as well as our physician billing.

## 2016-02-28 ENCOUNTER — Other Ambulatory Visit: Payer: Self-pay | Admitting: Nurse Practitioner

## 2017-03-28 IMAGING — CT CT CHEST W/ CM
2 of 5 series · 16 of 46 positions shown, 18 images · IV contrast (iopamidol)
Comparison: CT 01/28/2015

CLINICAL DATA: Gastric carcinoma.  Ongoing chemotherapy.

EXAM:
CT CHEST, ABDOMEN, AND PELVIS WITH CONTRAST
TECHNIQUE: Multidetector CT imaging of the chest, abdomen and pelvis was
performed following the standard protocol during bolus
administration of intravenous contrast.
CONTRAST:  100mL ZLY3DX-RXX IOPAMIDOL (ZLY3DX-RXX) INJECTION 61%

[Series 2: cap with st · axial · 0.77mm/px · z∈[-656,-90]mm · 13 of 129 slices shown, 15 images]
[im 8/129  soft-tissue]
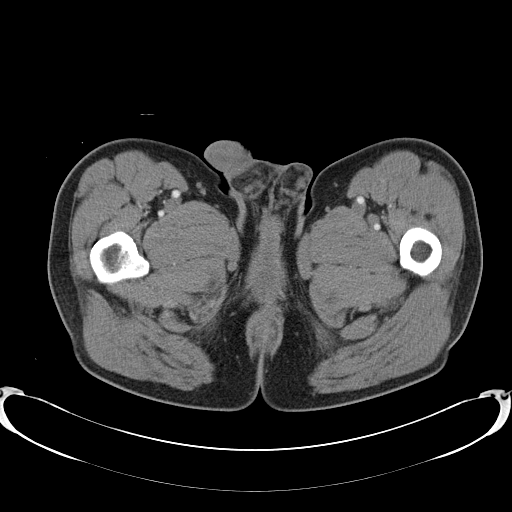
[im 8/129  bone]
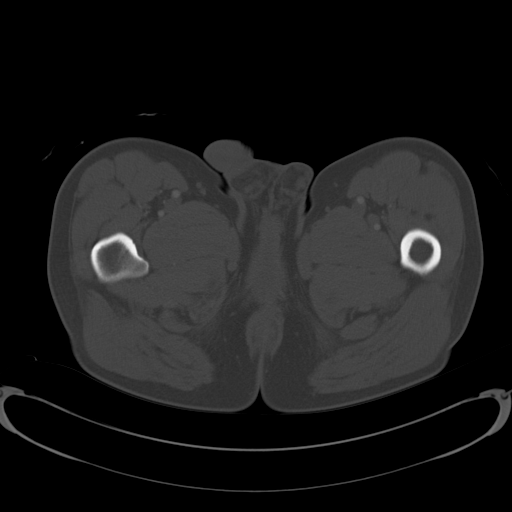
[im 15/129  soft-tissue]
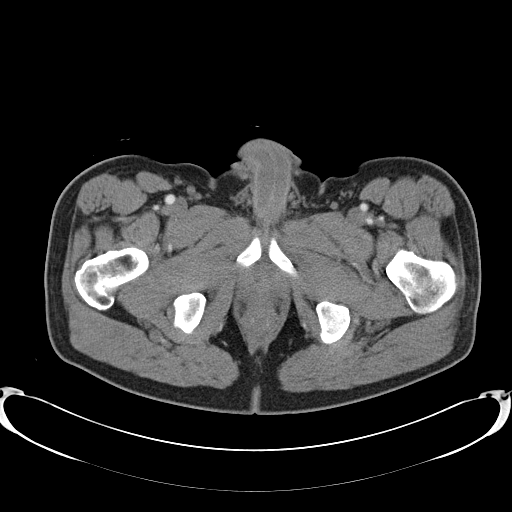
[im 29/129  soft-tissue]
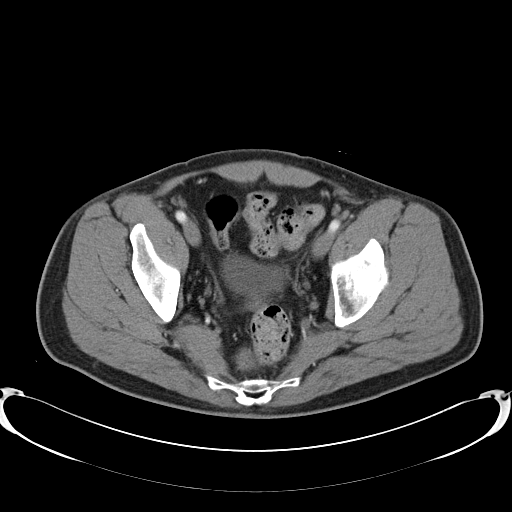
[im 36/129  soft-tissue]
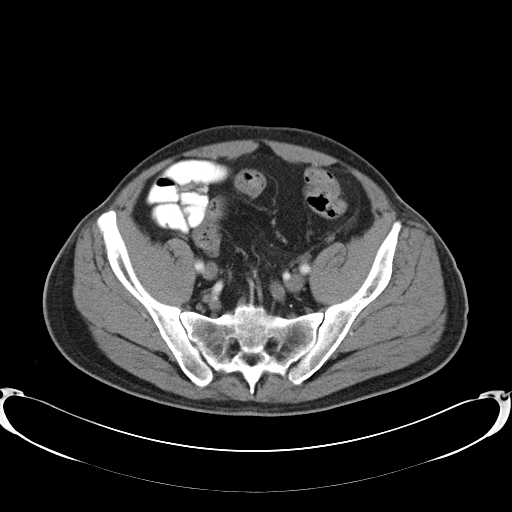
[im 43/129  soft-tissue]
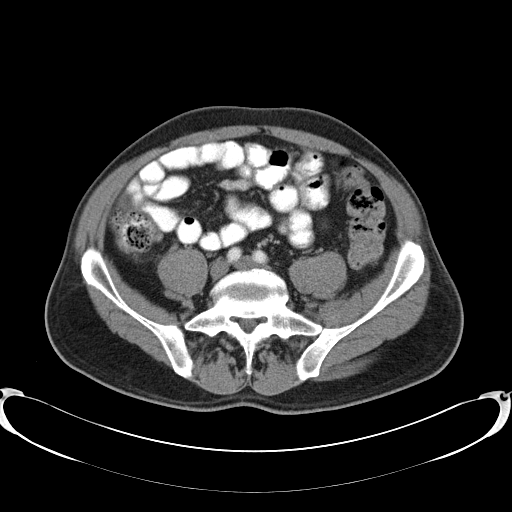
[im 57/129  soft-tissue]
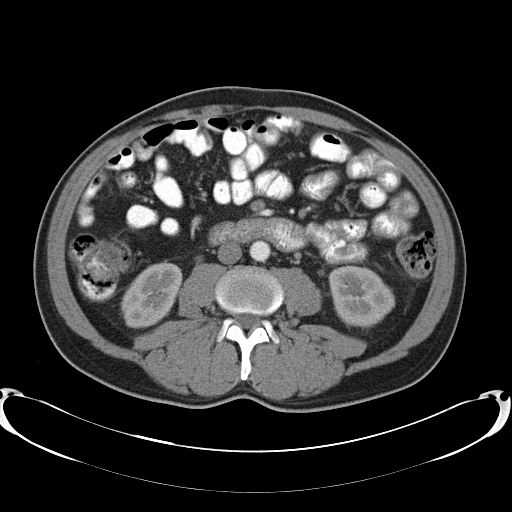
[im 65/129  soft-tissue]
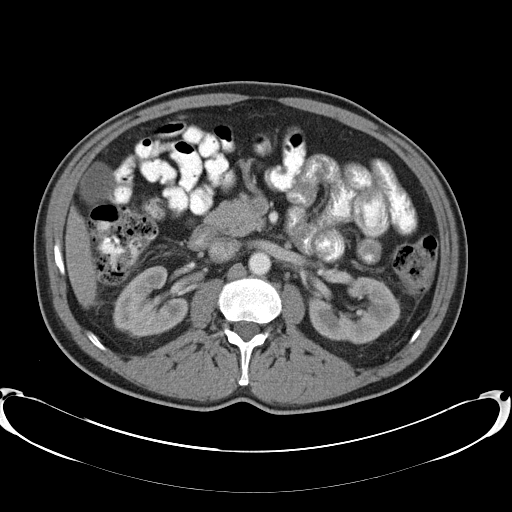
[im 72/129  soft-tissue]
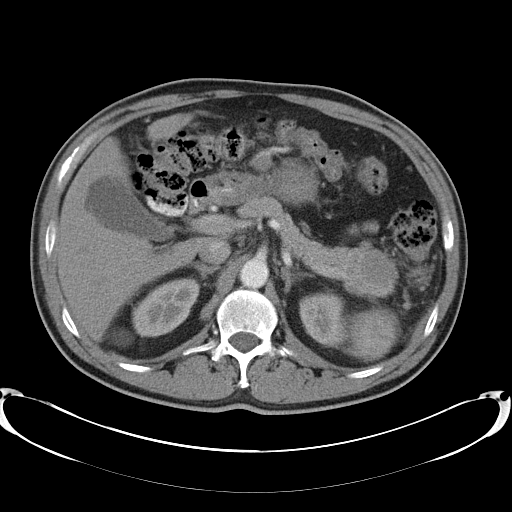
[im 86/129  soft-tissue]
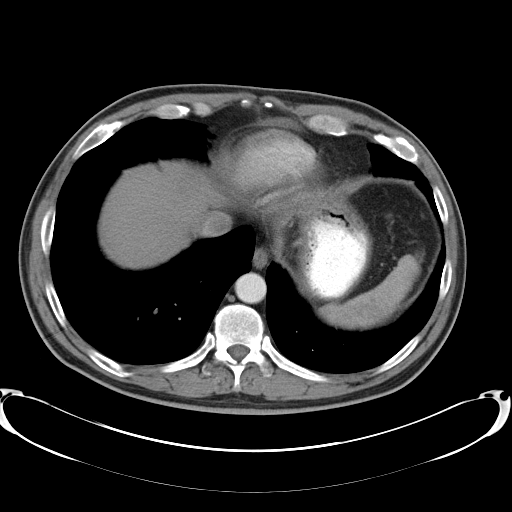
[im 86/129  bone]
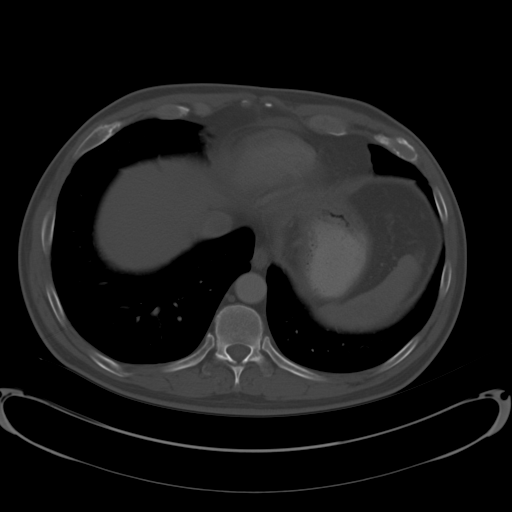
[im 93/129  soft-tissue]
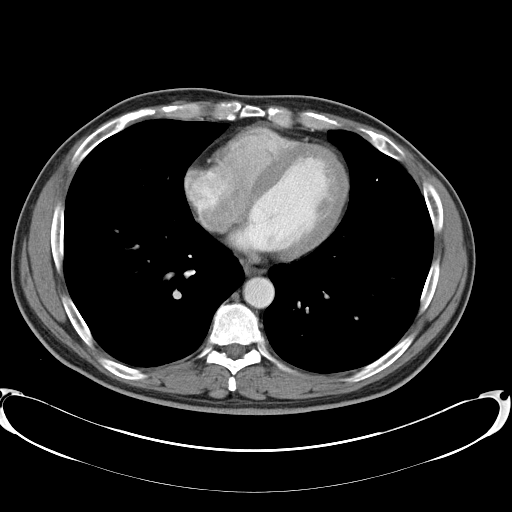
[im 100/129  soft-tissue]
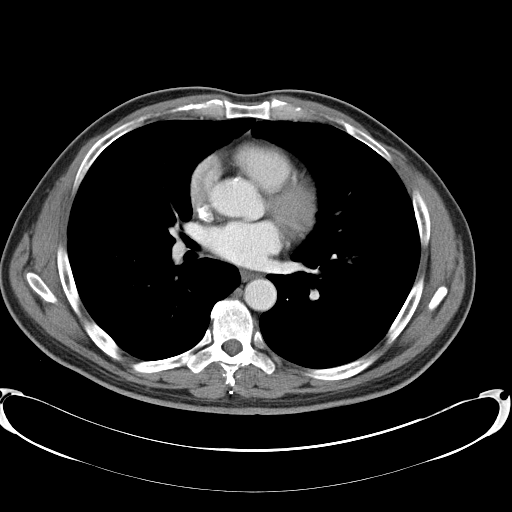
[im 114/129  soft-tissue]
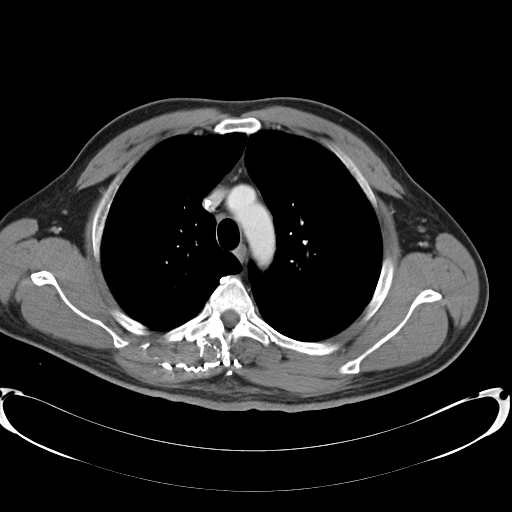
[im 121/129  soft-tissue]
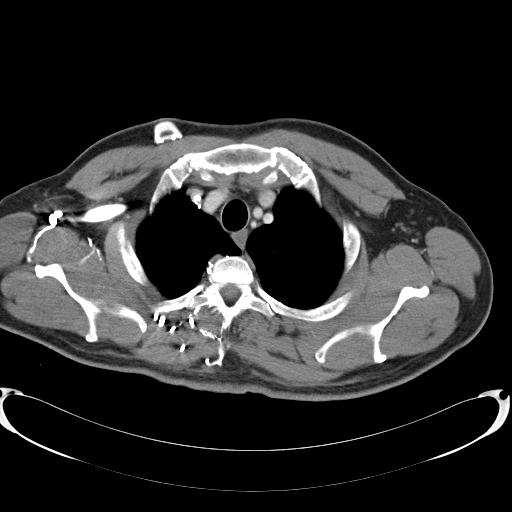

[Series 602: <mpr thick range> · coronal · 1.25mm/px · 3 of 85 slices shown]
[im 29/85  soft-tissue]
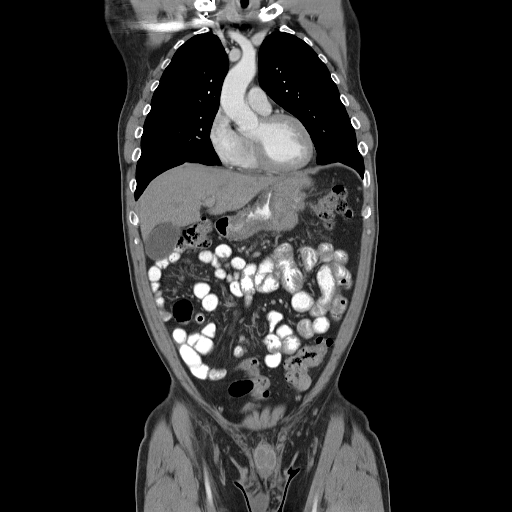
[im 38/85  soft-tissue]
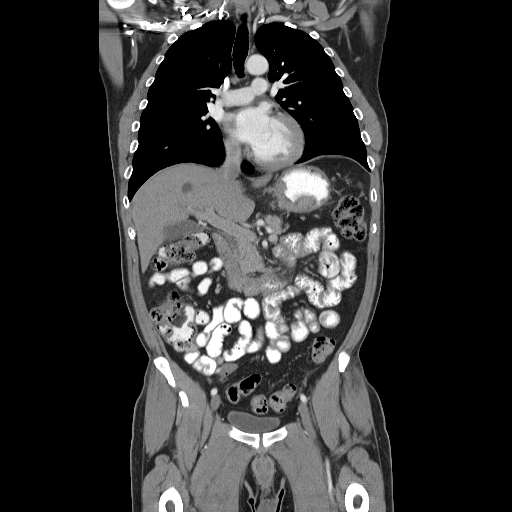
[im 47/85  soft-tissue]
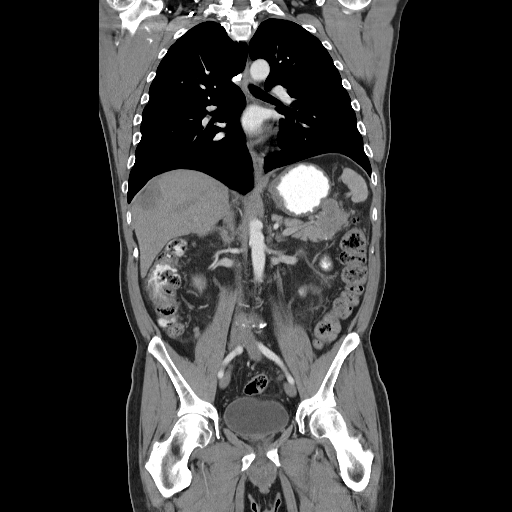

[16 of 46 positions shown; findings below may reference images not displayed]

FINDINGS: CT CHEST

CT CHEST FINDINGS

Mediastinum/Nodes: Port in the RIGHT chest wall. No axillary
supraclavicular adenopathy. No mediastinal hilar lymphadenopathy. No
pericardial fluid. Switch.

Lungs/Pleura: No suspicious pulmonary nodules.  Airways are normal.

Musculoskeletal: No aggressive osseous lesion.

CT ABDOMEN AND PELVIS FINDINGS

Hepatobiliary: RIGHT hepatic lobe metastasis measures 31 x 42 mm
(image 50, series 2) compared to 28 x 29 mm.

Rounded lesion in the posterior medial RIGHT hepatic lobe measures
20 mm (image 56, series 2) compared to 19 mm. Lesion most inferior
RIGHT hepatic lobe measures 13 mm also unchanged. Additional cystic
lesions centrally within the LEFT RIGHT hepatic lobe unchanged. No
new hepatic lesion.

Pancreas: Mass lesion or involving the tail the pancreas measuring
38 x 23 mm compared to 36 x 30 mm for no significant change in size.

Spleen: Normal

Adrenals/urinary tract: Adrenal glands and kidneys are normal. The
ureters and bladder normal.

Stomach/Bowel: Again demonstrated mass along the greater curvature
the stomach which is decreased in size measuring roughly measures
7.6 x 4.6 cm (251, series 2) compared to 9.3 x 5.6 cm. This gastric
wall mass has extension throat serosal surface with nodular
component (image 52, series 2) unchanged.

Metastatic lymph nodes the gastrohepatic ligament are similar. For
example 13 mm short axis node on image 53, series 2 compares to 14
mm.

Vascular/Lymphatic: Abdominal aorta is normal caliber. There is no
retroperitoneal or periportal lymphadenopathy. No pelvic
lymphadenopathy.

Reproductive: Prostate normal

Other: No peritoneal or omental metastasis.

Musculoskeletal: No aggressive osseous lesion.
IMPRESSION: Chest Impression:

No thoracic metastasis

Abdomen / Pelvis Impression:

1. Interval decrease in volume of gastric mass along the greater
curvature.
2. Interval increase in size of solitary hepatic metastasis.
3. Lesion within the gastrosplenic ligament which involves the tail
the pancreas is not changed significantly in size.
4. Stable gastrohepatic ligament metastatic adenopathy.
